# Patient Record
Sex: Male | Born: 1970 | Race: Black or African American | Hispanic: No | Marital: Single | Smoking: Current every day smoker
Health system: Southern US, Community
[De-identification: ages and names within clinical notes are randomized; demographics above are authoritative.]

## PROBLEM LIST (undated history)

## (undated) DIAGNOSIS — F419 Anxiety disorder, unspecified: Secondary | ICD-10-CM

## (undated) DIAGNOSIS — F32A Depression, unspecified: Secondary | ICD-10-CM

## (undated) DIAGNOSIS — F1020 Alcohol dependence, uncomplicated: Secondary | ICD-10-CM

## (undated) DIAGNOSIS — K602 Anal fissure, unspecified: Secondary | ICD-10-CM

## (undated) DIAGNOSIS — F329 Major depressive disorder, single episode, unspecified: Secondary | ICD-10-CM

## (undated) DIAGNOSIS — I10 Essential (primary) hypertension: Secondary | ICD-10-CM

## (undated) DIAGNOSIS — G4730 Sleep apnea, unspecified: Secondary | ICD-10-CM

## (undated) DIAGNOSIS — G43909 Migraine, unspecified, not intractable, without status migrainosus: Secondary | ICD-10-CM

## (undated) DIAGNOSIS — C801 Malignant (primary) neoplasm, unspecified: Secondary | ICD-10-CM

## (undated) HISTORY — DX: Alcohol dependence, uncomplicated: F10.20

## (undated) HISTORY — PX: HEMORROIDECTOMY: SUR656

## (undated) HISTORY — DX: Essential (primary) hypertension: I10

## (undated) HISTORY — DX: Anxiety disorder, unspecified: F41.9

## (undated) HISTORY — DX: Depression, unspecified: F32.A

## (undated) HISTORY — DX: Major depressive disorder, single episode, unspecified: F32.9

## (undated) HISTORY — DX: Anal fissure, unspecified: K60.2

---

## 2016-01-07 ENCOUNTER — Emergency Department (HOSPITAL_COMMUNITY)
Admission: EM | Admit: 2016-01-07 | Discharge: 2016-01-07 | Disposition: A | Discharge status: Home / Self Care | Payer: Managed Care, Other (non HMO) | Attending: Emergency Medicine | Admitting: Emergency Medicine

## 2016-01-07 ENCOUNTER — Encounter (HOSPITAL_COMMUNITY): Payer: Self-pay | Admitting: Family Medicine

## 2016-01-07 DIAGNOSIS — Z8679 Personal history of other diseases of the circulatory system: Secondary | ICD-10-CM | POA: Diagnosis not present

## 2016-01-07 DIAGNOSIS — M542 Cervicalgia: Secondary | ICD-10-CM | POA: Diagnosis not present

## 2016-01-07 DIAGNOSIS — M549 Dorsalgia, unspecified: Secondary | ICD-10-CM | POA: Diagnosis present

## 2016-01-07 DIAGNOSIS — M545 Low back pain: Secondary | ICD-10-CM | POA: Diagnosis not present

## 2016-01-07 DIAGNOSIS — F419 Anxiety disorder, unspecified: Secondary | ICD-10-CM | POA: Insufficient documentation

## 2016-01-07 DIAGNOSIS — F17200 Nicotine dependence, unspecified, uncomplicated: Secondary | ICD-10-CM | POA: Diagnosis not present

## 2016-01-07 DIAGNOSIS — R531 Weakness: Secondary | ICD-10-CM | POA: Diagnosis not present

## 2016-01-07 HISTORY — DX: Migraine, unspecified, not intractable, without status migrainosus: G43.909

## 2016-01-07 LAB — I-STAT TROPONIN, ED: Troponin i, poc: 0 ng/mL (ref 0.00–0.08)

## 2016-01-07 LAB — CBC
HCT: 45.9 % (ref 39.0–52.0)
HEMOGLOBIN: 15.6 g/dL (ref 13.0–17.0)
MCH: 28.7 pg (ref 26.0–34.0)
MCHC: 34 g/dL (ref 30.0–36.0)
MCV: 84.4 fL (ref 78.0–100.0)
PLATELETS: 287 10*3/uL (ref 150–400)
RBC: 5.44 MIL/uL (ref 4.22–5.81)
RDW: 13.3 % (ref 11.5–15.5)
WBC: 6.4 10*3/uL (ref 4.0–10.5)

## 2016-01-07 LAB — BASIC METABOLIC PANEL
Anion gap: 9 (ref 5–15)
BUN: 7 mg/dL (ref 6–20)
CHLORIDE: 109 mmol/L (ref 101–111)
CO2: 21 mmol/L — AB (ref 22–32)
CREATININE: 1.24 mg/dL (ref 0.61–1.24)
Calcium: 9.5 mg/dL (ref 8.9–10.3)
GFR calc non Af Amer: >60 mL/min (ref >60)
GLUCOSE: 112 mg/dL — AB (ref 65–99)
Potassium: 4.1 mmol/L (ref 3.5–5.1)
Sodium: 139 mmol/L (ref 135–145)

## 2016-01-07 MED ORDER — IBUPROFEN 200 MG PO TABS
600.0000 mg | ORAL_TABLET | Freq: Once | ORAL | Status: AC
  Administered 2016-01-07: 600 mg via ORAL
  Filled 2016-01-07: qty 1

## 2016-01-07 NOTE — ED Provider Notes (Signed)
CSN: [id number]     Arrival date & time 01/07/16  1530 History   First MD Initiated Contact with Patient 01/07/16 1832     Chief Complaint  Patient presents with  . Neck Pain  . Back Pain  . Weakness     (Consider location/radiation/quality/duration/timing/severity/associated sxs/prior Treatment) HPI Patient is an otherwise healthy 45 year old male who presents with neck and back pain, generalized malaise for 3 days. He says that he has been under tremendous amount of stress recently due to some very difficult family situations. He says that he has felt like his stress and generalized anxiety of being building up over the past few months. He says that his neck and back pain seems to be primarily worsening over the past 3 days. He says that he ate some questionable pizza that made everyone else sick the day prior to his symptoms beginning. He denies any paresthesias, lower extremity weakness, shortness of breath, fevers, chills, dysuria. He is never had similar symptoms before. He says he has not tried anything for the pain. He says that the pain is worse with neck movement and when getting up from a lying down position. Past Medical History  Diagnosis Date  . Migraine    History reviewed. No pertinent past surgical history. History reviewed. No pertinent family history. Social History  Substance Use Topics  . Smoking status: Current Every Day Smoker  . Smokeless tobacco: None  . Alcohol Use: Yes     Comment: 40 oz a day    Review of Systems  Respiratory: Negative for shortness of breath.   Cardiovascular: Negative for chest pain.  Musculoskeletal: Positive for myalgias, back pain and neck pain. Negative for joint swelling, arthralgias, gait problem and neck stiffness.  Psychiatric/Behavioral: The patient is nervous/anxious.   All other systems reviewed and are negative.     Allergies  Review of patient's allergies indicates no known allergies.  Home Medications   Prior to  Admission medications   Not on File   BP 148/111 mmHg  Pulse 72  Temp(Src) 98.1 F (36.7 C) (Oral)  Resp 18  Ht 6\' 2"  (1.88 m)  Wt 117.255 kg  BMI 33.18 kg/m2  SpO2 97% Physical Exam  Constitutional: He is oriented to person, place, and time. He appears well-developed and well-nourished.  HENT:  Head: Normocephalic and atraumatic.  Eyes: Pupils are equal, round, and reactive to light.  Neck: No JVD present.  Cardiovascular: Normal rate, regular rhythm and normal heart sounds.   Pulmonary/Chest: Effort normal and breath sounds normal. No respiratory distress.  Abdominal: Soft. Bowel sounds are normal. He exhibits no distension. There is no tenderness.  Musculoskeletal:  Paraspinous muscle tenderness along the lumbar spine, lateral to cervical spine, no bony tenderness  Neurological: He is alert and oriented to person, place, and time.  Normal bilateral patellar and ankle reflexes  Sensation normal and bilateral lower extremities, strength normal in bilateral lower extremities  Skin: Skin is warm and dry.  Nursing note and vitals reviewed.   ED Course  Procedures (including critical care time) Labs Review Labs Reviewed  BASIC METABOLIC PANEL - Abnormal; Notable for the following:    CO2 21 (*)    Glucose, Bld 112 (*)    All other components within normal limits  CBC  I-STAT TROPOININ, ED    Imaging Review No results found. I have personally reviewed and evaluated these images and lab results as part of my medical decision-making.   EKG Interpretation None  MDM   Final diagnoses:  Mechanical back pain    Patient presents with neck and back pain. He has no red flag symptoms such as urinary continence, perianal anesthesia, lower extremity weakness. He has had no trauma to his back. He has no midline tenderness. All of his back and neck pain seems to be muscular in etiology and is also not associated with any neurologic deficits. EKG obtained and shows no  abnormalities, troponin obtained through triage, though I have no concern for ACS given his generalized symptoms, it is negative nonetheless.   It seems most of his symptoms are likely related to stress, and he has no PCP, so I will set him up for a PCP appointment.  Erskine Emery, MD 01/08/16 1610  Rolland Porter, MD 01/17/16 [phone]

## 2016-01-07 NOTE — ED Notes (Signed)
Pt here for neck pain, bilateral arm heaviness and more in left arm, back pain and weakness. sts that he has been under a lot of stress in the last month. sts that he has been having a lot of migraines. sts also had some bad pizza a week ago. Denies chest pain.

## 2016-01-07 NOTE — ED Provider Notes (Signed)
Patient seen and evaluated. Discussed at length with Dr. Earlene Plateravis.  Patient reports a lot of social stressors in his life. Occasional headaches and back pain. Generalized weakness. Has normal neurological exam. No red flag neurological symptoms regarding his headache or back pain. He is ambulatory. He is not suicidal. Given referrals for outpatient counseling regarding his stress and anxiety.  Walter PorterMark Margarie Mcguirt, MD 01/07/16 [phone]

## 2016-01-07 NOTE — Discharge Instructions (Signed)

## 2016-01-23 ENCOUNTER — Ambulatory Visit (HOSPITAL_BASED_OUTPATIENT_CLINIC_OR_DEPARTMENT_OTHER)
Admission: RE | Admit: 2016-01-23 | Discharge: 2016-01-23 | Disposition: A | Discharge status: Home / Self Care | Payer: Managed Care, Other (non HMO) | Source: Ambulatory Visit | Attending: Medical | Admitting: Medical

## 2016-01-23 ENCOUNTER — Encounter: Payer: Self-pay | Admitting: Medical

## 2016-01-23 ENCOUNTER — Ambulatory Visit (INDEPENDENT_AMBULATORY_CARE_PROVIDER_SITE_OTHER): Payer: Managed Care, Other (non HMO) | Admitting: Medical

## 2016-01-23 VITALS — BP 128/84 | HR 79 | Temp 98.0°F | Ht 74.5 in | Wt 258.4 lb

## 2016-01-23 DIAGNOSIS — M545 Low back pain: Secondary | ICD-10-CM | POA: Diagnosis not present

## 2016-01-23 DIAGNOSIS — F411 Generalized anxiety disorder: Secondary | ICD-10-CM | POA: Diagnosis not present

## 2016-01-23 DIAGNOSIS — M546 Pain in thoracic spine: Secondary | ICD-10-CM

## 2016-01-23 DIAGNOSIS — M4805 Spinal stenosis, thoracolumbar region: Secondary | ICD-10-CM | POA: Diagnosis not present

## 2016-01-23 DIAGNOSIS — M2578 Osteophyte, vertebrae: Secondary | ICD-10-CM | POA: Diagnosis not present

## 2016-01-23 DIAGNOSIS — M47895 Other spondylosis, thoracolumbar region: Secondary | ICD-10-CM | POA: Insufficient documentation

## 2016-01-23 DIAGNOSIS — M2550 Pain in unspecified joint: Secondary | ICD-10-CM | POA: Diagnosis not present

## 2016-01-23 DIAGNOSIS — R739 Hyperglycemia, unspecified: Secondary | ICD-10-CM | POA: Diagnosis not present

## 2016-01-23 LAB — C-REACTIVE PROTEIN: CRP: 0.2 mg/dL — ABNORMAL LOW (ref 0.5–20.0)

## 2016-01-23 LAB — RHEUMATOID FACTOR

## 2016-01-23 LAB — HEMOGLOBIN A1C: Hgb A1c MFr Bld: 5.7 % (ref 4.6–6.5)

## 2016-01-23 LAB — SEDIMENTATION RATE: Sed Rate: 1 mm/hr (ref 0–22)

## 2016-01-23 MED ORDER — ALPRAZOLAM 0.25 MG PO TABS: 0.2500 mg | ORAL_TABLET | Freq: Two times a day (BID) | ORAL | Status: DC | PRN

## 2016-01-23 MED ORDER — SERTRALINE HCL 25 MG PO TABS: 25.0000 mg | ORAL_TABLET | Freq: Every day | ORAL | Status: DC

## 2016-01-23 NOTE — Progress Notes (Signed)
Pre visit review using our clinic review tool, if applicable. No additional management support is needed unless otherwise documented below in the visit note. 
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
## 2016-01-23 NOTE — Patient Instructions (Signed)
I do think under your extreme stressful circumstances I do think you have some anxiety and mayb depression. I think you may benefit form sertraline and xanax. Will write low dose and low number.  If you have extreme emotions or thoughts(hurting self or others then ED evaluation).  Please contact counselor number I gave you.  Will get xrays, ra panel and hb a1-c.Marland Kitchen.  Can use ibupfrofen otc for body pain.  Follow up in 7 days or as needed

## 2016-01-26 ENCOUNTER — Telehealth: Payer: Self-pay

## 2016-01-26 ENCOUNTER — Telehealth: Payer: Self-pay | Admitting: Medical

## 2016-01-26 ENCOUNTER — Encounter: Payer: Managed Care, Other (non HMO) | Admitting: Medical

## 2016-01-26 ENCOUNTER — Encounter: Payer: Self-pay | Admitting: Medical

## 2016-01-26 LAB — ANA: Anti Nuclear Antibody(ANA): NEGATIVE

## 2016-01-26 NOTE — Telephone Encounter (Signed)
Pt states that he was not feeling well and the meal that he had last night for mothers day was not agreeing with him. He also stated that the Sertraline was giving him some diarrhea and he wants to see what the provider advises on a different medication with less side effects. He states that he will discuss this with you at his CPE on 01/28/16.

## 2016-01-26 NOTE — Progress Notes (Signed)
This encounter was created in error - please disregard.

## 2016-01-26 NOTE — Telephone Encounter (Signed)
Call pt will discuss sertraline when in. Usually does not cause diarrhea. Maybe he has Gi illness from food he ate as he described? But for time being stop sertraline.

## 2016-01-26 NOTE — Telephone Encounter (Signed)
Pt reported on 01/26/16 that he had stopped taking the sertraline and he will discuss at the next visit with E. Saguier.

## 2016-01-26 NOTE — Telephone Encounter (Signed)
error:[id number] ° °

## 2016-01-27 ENCOUNTER — Telehealth: Payer: Self-pay | Admitting: Medical

## 2016-01-27 NOTE — Telephone Encounter (Signed)
Error

## 2016-01-28 ENCOUNTER — Encounter: Payer: Self-pay | Admitting: Medical

## 2016-01-28 ENCOUNTER — Ambulatory Visit (INDEPENDENT_AMBULATORY_CARE_PROVIDER_SITE_OTHER): Payer: Managed Care, Other (non HMO) | Admitting: Medical

## 2016-01-28 VITALS — BP 126/86 | HR 87 | Temp 98.0°F | Ht 74.5 in | Wt 257.0 lb

## 2016-01-28 DIAGNOSIS — G43809 Other migraine, not intractable, without status migrainosus: Secondary | ICD-10-CM

## 2016-01-28 DIAGNOSIS — Z0000 Encounter for general adult medical examination without abnormal findings: Secondary | ICD-10-CM | POA: Diagnosis not present

## 2016-01-28 DIAGNOSIS — Z0001 Encounter for general adult medical examination with abnormal findings: Secondary | ICD-10-CM

## 2016-01-28 DIAGNOSIS — Z23 Encounter for immunization: Secondary | ICD-10-CM | POA: Diagnosis not present

## 2016-01-28 DIAGNOSIS — R7989 Other specified abnormal findings of blood chemistry: Secondary | ICD-10-CM

## 2016-01-28 DIAGNOSIS — Z113 Encounter for screening for infections with a predominantly sexual mode of transmission: Secondary | ICD-10-CM

## 2016-01-28 LAB — LIPID PANEL
CHOL/HDL RATIO: 8
CHOLESTEROL: 209 mg/dL — AB (ref 0–200)
HDL: 26.7 mg/dL — ABNORMAL LOW (ref >39.00)
NonHDL: 181.89
TRIGLYCERIDES: 326 mg/dL — AB (ref 0.0–149.0)
VLDL: 65.2 mg/dL — AB (ref 0.0–40.0)

## 2016-01-28 LAB — COMPREHENSIVE METABOLIC PANEL
ALT: 69 U/L — ABNORMAL HIGH (ref 0–53)
AST: 42 U/L — AB (ref 0–37)
Albumin: 4.4 g/dL (ref 3.5–5.2)
Alkaline Phosphatase: 98 U/L (ref 39–117)
BUN: 14 mg/dL (ref 6–23)
CALCIUM: 9.2 mg/dL (ref 8.4–10.5)
CHLORIDE: 108 meq/L (ref 96–112)
CO2: 22 meq/L (ref 19–32)
CREATININE: 1.28 mg/dL (ref 0.40–1.50)
GFR: 78.69 mL/min (ref >60.00)
Glucose, Bld: 109 mg/dL — ABNORMAL HIGH (ref 70–99)
Potassium: 3.8 mEq/L (ref 3.5–5.1)
Sodium: 138 mEq/L (ref 135–145)
Total Bilirubin: 0.5 mg/dL (ref 0.2–1.2)
Total Protein: 7.5 g/dL (ref 6.0–8.3)

## 2016-01-28 LAB — CBC WITH DIFFERENTIAL/PLATELET
BASOS ABS: 0 10*3/uL (ref 0.0–0.1)
Basophils Relative: 0.6 % (ref 0.0–3.0)
EOS ABS: 0.3 10*3/uL (ref 0.0–0.7)
Eosinophils Relative: 4.9 % (ref 0.0–5.0)
HEMATOCRIT: 45.9 % (ref 39.0–52.0)
Hemoglobin: 15.6 g/dL (ref 13.0–17.0)
LYMPHS ABS: 3 10*3/uL (ref 0.7–4.0)
Lymphocytes Relative: 45.5 % (ref 12.0–46.0)
MCHC: 33.9 g/dL (ref 30.0–36.0)
MCV: 85.2 fl (ref 78.0–100.0)
MONOS PCT: 8.1 % (ref 3.0–12.0)
Monocytes Absolute: 0.5 10*3/uL (ref 0.1–1.0)
NEUTROS PCT: 40.9 % — AB (ref 43.0–77.0)
Neutro Abs: 2.7 10*3/uL (ref 1.4–7.7)
PLATELETS: 312 10*3/uL (ref 150.0–400.0)
RBC: 5.38 Mil/uL (ref 4.22–5.81)
RDW: 13.5 % (ref 11.5–15.5)
WBC: 6.5 10*3/uL (ref 4.0–10.5)

## 2016-01-28 LAB — POC URINALSYSI DIPSTICK (AUTOMATED)
Bilirubin, UA: NEGATIVE
Blood, UA: NEGATIVE
GLUCOSE UA: NEGATIVE
KETONES UA: NEGATIVE
Leukocytes, UA: NEGATIVE
Nitrite, UA: NEGATIVE
Protein, UA: NEGATIVE
SPEC GRAV UA: 1.02
Urobilinogen, UA: 1
pH, UA: 6

## 2016-01-28 LAB — LDL CHOLESTEROL, DIRECT: Direct LDL: 114 mg/dL

## 2016-01-28 LAB — TSH: TSH: 2.45 u[IU]/mL (ref 0.35–4.50)

## 2016-01-28 MED ORDER — ALPRAZOLAM 0.25 MG PO TABS: 0.2500 mg | ORAL_TABLET | Freq: Two times a day (BID) | ORAL | Status: DC | PRN

## 2016-01-28 MED ORDER — KETOROLAC TROMETHAMINE 60 MG/2ML IM SOLN
60.0000 mg | Freq: Once | INTRAMUSCULAR | Status: AC
Start: 2016-01-28 — End: 2016-01-28
  Administered 2016-01-28: 60 mg via INTRAMUSCULAR

## 2016-01-28 NOTE — Addendum Note (Signed)
Addended by: Neldon LabellaMABE, HOLDEN S on: 01/28/2016 04:51 PM   Modules accepted: Orders

## 2016-01-28 NOTE — Assessment & Plan Note (Signed)
Cbc,cmp, tsh, lipid panel, hiv screen and ua. Will update tdap today.

## 2016-01-28 NOTE — Progress Notes (Signed)
Pre visit review using our clinic review tool, if applicable. No additional management support is needed unless otherwise documented below in the visit note. 

## 2016-01-28 NOTE — Addendum Note (Signed)
Addended by: Neldon LabellaMABE, HOLDEN S on: 01/28/2016 11:16 AM   Modules accepted: Orders
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
## 2016-01-28 NOTE — Addendum Note (Signed)
Addended by: Neldon LabellaMABE, Fielding Mault S on: 01/28/2016 11:52 AM   Modules accepted: Orders

## 2016-01-28 NOTE — Patient Instructions (Addendum)
Wellness examination Cbc,cmp, tsh, lipid panel, hiv screen and ua. Will update tdap today.    For your anxiety, stress and mood will rx xanax. Will hold off on sertraline type med until you see psychiatrist next week.  I am going to write a note today explaining that time off from work is reasonable. And that psychiatrist will be filling official forms. By chance if they won't fill out forms let me know.  Will follow labs and let you know results when they are back.  For tension ha today we gave toradol 60 mg im today. If worse ha with other signs or symptoms as discussed.  Preventive Care for Adults, Male A healthy lifestyle and preventive care can promote health and wellness. Preventive health guidelines for men include the following key practices:  A routine yearly physical is a good way to check with your health care provider about your health and preventative screening. It is a chance to share any concerns and updates on your health and to receive a thorough exam.  Visit your dentist for a routine exam and preventative care every 6 months. Brush your teeth twice a day and floss once a day. Good oral hygiene prevents tooth decay and gum disease.  The frequency of eye exams is based on your age, health, family medical history, use of contact lenses, and other factors. Follow your health care provider's recommendations for frequency of eye exams.  Eat a healthy diet. Foods such as vegetables, fruits, whole grains, low-fat dairy products, and lean protein foods contain the nutrients you need without too many calories. Decrease your intake of foods high in solid fats, added sugars, and salt. Eat the right amount of calories for you.Get information about a proper diet from your health care provider, if necessary.  Regular physical exercise is one of the most important things you can do for your health. Most adults should get at least 150 minutes of moderate-intensity exercise (any activity  that increases your heart rate and causes you to sweat) each week. In addition, most adults need muscle-strengthening exercises on 2 or more days a week.  Maintain a healthy weight. The body mass index (BMI) is a screening tool to identify possible weight problems. It provides an estimate of body fat based on height and weight. Your health care provider can find your BMI and can help you achieve or maintain a healthy weight.For adults 20 years and older:  A BMI below 18.5 is considered underweight.  A BMI of 18.5 to 24.9 is normal.  A BMI of 25 to 29.9 is considered overweight.  A BMI of 30 and above is considered obese.  Maintain normal blood lipids and cholesterol levels by exercising and minimizing your intake of saturated fat. Eat a balanced diet with plenty of fruit and vegetables. Blood tests for lipids and cholesterol should begin at age 52 and be repeated every 5 years. If your lipid or cholesterol levels are high, you are over 50, or you are at high risk for heart disease, you may need your cholesterol levels checked more frequently.Ongoing high lipid and cholesterol levels should be treated with medicines if diet and exercise are not working.  If you smoke, find out from your health care provider how to quit. If you do not use tobacco, do not start.  Lung cancer screening is recommended for adults aged 39-80 years who are at high risk for developing lung cancer because of a history of smoking. A yearly low-dose CT scan of  the lungs is recommended for people who have at least a 30-pack-year history of smoking and are a current smoker or have quit within the past 15 years. A pack year of smoking is smoking an average of 1 pack of cigarettes a day for 1 year (for example: 1 pack a day for 30 years or 2 packs a day for 15 years). Yearly screening should continue until the smoker has stopped smoking for at least 15 years. Yearly screening should be stopped for people who develop a health  problem that would prevent them from having lung cancer treatment.  If you choose to drink alcohol, do not have more than 2 drinks per day. One drink is considered to be 12 ounces (355 mL) of beer, 5 ounces (148 mL) of wine, or 1.5 ounces (44 mL) of liquor.  Avoid use of street drugs. Do not share needles with anyone. Ask for help if you need support or instructions about stopping the use of drugs.  High blood pressure causes heart disease and increases the risk of stroke. Your blood pressure should be checked at least every 1-2 years. Ongoing high blood pressure should be treated with medicines, if weight loss and exercise are not effective.  If you are 4-41 years old, ask your health care provider if you should take aspirin to prevent heart disease.  Diabetes screening is done by taking a blood sample to check your blood glucose level after you have not eaten for a certain period of time (fasting). If you are not overweight and you do not have risk factors for diabetes, you should be screened once every 3 years starting at age 63. If you are overweight or obese and you are 19-74 years of age, you should be screened for diabetes every year as part of your cardiovascular risk assessment.  Colorectal cancer can be detected and often prevented. Most routine colorectal cancer screening begins at the age of 71 and continues through age 30. However, your health care provider may recommend screening at an earlier age if you have risk factors for colon cancer. On a yearly basis, your health care provider may provide home test kits to check for hidden blood in the stool. Use of a small camera at the end of a tube to directly examine the colon (sigmoidoscopy or colonoscopy) can detect the earliest forms of colorectal cancer. Talk to your health care provider about this at age 29, when routine screening begins. Direct exam of the colon should be repeated every 5-10 years through age 67, unless early forms of  precancerous polyps or small growths are found.  People who are at an increased risk for hepatitis B should be screened for this virus. You are considered at high risk for hepatitis B if:  You were born in a country where hepatitis B occurs often. Talk with your health care provider about which countries are considered high risk.  Your parents were born in a high-risk country and you have not received a shot to protect against hepatitis B (hepatitis B vaccine).  You have HIV or AIDS.  You use needles to inject street drugs.  You live with, or have sex with, someone who has hepatitis B.  You are a man who has sex with other men (MSM).  You get hemodialysis treatment.  You take certain medicines for conditions such as cancer, organ transplantation, and autoimmune conditions.  Hepatitis C blood testing is recommended for all people born from [birth year] through 1965 and any individual  with known risks for hepatitis C.  Practice safe sex. Use condoms and avoid high-risk sexual practices to reduce the spread of sexually transmitted infections (STIs). STIs include gonorrhea, chlamydia, syphilis, trichomonas, herpes, HPV, and human immunodeficiency virus (HIV). Herpes, HIV, and HPV are viral illnesses that have no cure. They can result in disability, cancer, and death.  If you are a man who has sex with other men, you should be screened at least once per year for:  HIV.  Urethral, rectal, and pharyngeal infection of gonorrhea, chlamydia, or both.  If you are at risk of being infected with HIV, it is recommended that you take a prescription medicine daily to prevent HIV infection. This is called preexposure prophylaxis (PrEP). You are considered at risk if:  You are a man who has sex with other men (MSM) and have other risk factors.  You are a heterosexual man, are sexually active, and are at increased risk for HIV infection.  You take drugs by injection.  You are sexually active with a  partner who has HIV.  Talk with your health care provider about whether you are at high risk of being infected with HIV. If you choose to begin PrEP, you should first be tested for HIV. You should then be tested every 3 months for as long as you are taking PrEP.  A one-time screening for abdominal aortic aneurysm (AAA) and surgical repair of large AAAs by ultrasound are recommended for men ages 86 to 55 years who are current or former smokers.  Healthy men should no longer receive prostate-specific antigen (PSA) blood tests as part of routine cancer screening. Talk with your health care provider about prostate cancer screening.  Testicular cancer screening is not recommended for adult males who have no symptoms. Screening includes self-exam, a health care provider exam, and other screening tests. Consult with your health care provider about any symptoms you have or any concerns you have about testicular cancer.  Use sunscreen. Apply sunscreen liberally and repeatedly throughout the day. You should seek shade when your shadow is shorter than you. Protect yourself by wearing long sleeves, pants, a wide-brimmed hat, and sunglasses year round, whenever you are outdoors.  Once a month, do a whole-body skin exam, using a mirror to look at the skin on your back. Tell your health care provider about new moles, moles that have irregular borders, moles that are larger than a pencil eraser, or moles that have changed in shape or color.  Stay current with required vaccines (immunizations).  Influenza vaccine. All adults should be immunized every year.  Tetanus, diphtheria, and acellular pertussis (Td, Tdap) vaccine. An adult who has not previously received Tdap or who does not know his vaccine status should receive 1 dose of Tdap. This initial dose should be followed by tetanus and diphtheria toxoids (Td) booster doses every 10 years. Adults with an unknown or incomplete history of completing a 3-dose  immunization series with Td-containing vaccines should begin or complete a primary immunization series including a Tdap dose. Adults should receive a Td booster every 10 years.  Varicella vaccine. An adult without evidence of immunity to varicella should receive 2 doses or a second dose if he has previously received 1 dose.  Human papillomavirus (HPV) vaccine. Males aged 11-21 years who have not received the vaccine previously should receive the 3-dose series. Males aged 22-26 years may be immunized. Immunization is recommended through the age of 53 years for any male who has sex with males and  did not get any or all doses earlier. Immunization is recommended for any person with an immunocompromised condition through the age of 47 years if he did not get any or all doses earlier. During the 3-dose series, the second dose should be obtained 4-8 weeks after the first dose. The third dose should be obtained 24 weeks after the first dose and 16 weeks after the second dose.  Zoster vaccine. One dose is recommended for adults aged 43 years or older unless certain conditions are present.  Measles, mumps, and rubella (MMR) vaccine. Adults born before [birth year] generally are considered immune to measles and mumps. Adults born in [birth year] or later should have 1 or more doses of MMR vaccine unless there is a contraindication to the vaccine or there is laboratory evidence of immunity to each of the three diseases. A routine second dose of MMR vaccine should be obtained at least 28 days after the first dose for students attending postsecondary schools, health care workers, or international travelers. People who received inactivated measles vaccine or an unknown type of measles vaccine during 1963-1967 should receive 2 doses of MMR vaccine. People who received inactivated mumps vaccine or an unknown type of mumps vaccine before 1979 and are at high risk for mumps infection should consider immunization with 2 doses of MMR vaccine.  Unvaccinated health care workers born before [birth year] who lack laboratory evidence of measles, mumps, or rubella immunity or laboratory confirmation of disease should consider measles and mumps immunization with 2 doses of MMR vaccine or rubella immunization with 1 dose of MMR vaccine.  Pneumococcal 13-valent conjugate (PCV13) vaccine. When indicated, a person who is uncertain of his immunization history and has no record of immunization should receive the PCV13 vaccine. All adults 81 years of age and older should receive this vaccine. An adult aged 50 years or older who has certain medical conditions and has not been previously immunized should receive 1 dose of PCV13 vaccine. This PCV13 should be followed with a dose of pneumococcal polysaccharide (PPSV23) vaccine. Adults who are at high risk for pneumococcal disease should obtain the PPSV23 vaccine at least 8 weeks after the dose of PCV13 vaccine. Adults older than 44 years of age who have normal immune system function should obtain the PPSV23 vaccine dose at least 1 year after the dose of PCV13 vaccine.  Pneumococcal polysaccharide (PPSV23) vaccine. When PCV13 is also indicated, PCV13 should be obtained first. All adults aged 56 years and older should be immunized. An adult younger than age 75 years who has certain medical conditions should be immunized. Any person who resides in a nursing home or long-term care facility should be immunized. An adult smoker should be immunized. People with an immunocompromised condition and certain other conditions should receive both PCV13 and PPSV23 vaccines. People with human immunodeficiency virus (HIV) infection should be immunized as soon as possible after diagnosis. Immunization during chemotherapy or radiation therapy should be avoided. Routine use of PPSV23 vaccine is not recommended for American Indians, [place] Natives, or people younger than 65 years unless there are medical conditions that require PPSV23 vaccine.  When indicated, people who have unknown immunization and have no record of immunization should receive PPSV23 vaccine. One-time revaccination 5 years after the first dose of PPSV23 is recommended for people aged 19-64 years who have chronic kidney failure, nephrotic syndrome, asplenia, or immunocompromised conditions. People who received 1-2 doses of PPSV23 before age 13 years should receive another dose of PPSV23 vaccine at age 87 years or  later if at least 5 years have passed since the previous dose. Doses of PPSV23 are not needed for people immunized with PPSV23 at or after age 57 years.  Meningococcal vaccine. Adults with asplenia or persistent complement component deficiencies should receive 2 doses of quadrivalent meningococcal conjugate (MenACWY-D) vaccine. The doses should be obtained at least 2 months apart. Microbiologists working with certain meningococcal bacteria, [place] recruits, people at risk during an outbreak, and people who travel to or live in countries with a high rate of meningitis should be immunized. A first-year college student up through age 25 years who is living in a residence hall should receive a dose if he did not receive a dose on or after his 16th birthday. Adults who have certain high-risk conditions should receive one or more doses of vaccine.  Hepatitis A vaccine. Adults who wish to be protected from this disease, have chronic liver disease, work with hepatitis A-infected animals, work in hepatitis A research labs, or travel to or work in countries with a high rate of hepatitis A should be immunized. Adults who were previously unvaccinated and who anticipate close contact with an international adoptee during the first 60 days after arrival in the [place] from a country with a high rate of hepatitis A should be immunized.  Hepatitis B vaccine. Adults should be immunized if they wish to be protected from this disease, are under age 41 years and have diabetes, have  chronic liver disease, have had more than one sex partner in the past 6 months, may be exposed to blood or other infectious body fluids, are household contacts or sex partners of hepatitis B positive people, are clients or workers in certain care facilities, or travel to or work in countries with a high rate of hepatitis B.  Haemophilus influenzae type b (Hib) vaccine. A previously unvaccinated person with asplenia or sickle cell disease or having a scheduled splenectomy should receive 1 dose of Hib vaccine. Regardless of previous immunization, a recipient of a hematopoietic stem cell transplant should receive a 3-dose series 6-12 months after his successful transplant. Hib vaccine is not recommended for adults with HIV infection. Preventive Service / Frequency Ages 55 to 56  Blood pressure check.** / Every 3-5 years.  Lipid and cholesterol check.** / Every 5 years beginning at age 31.  Hepatitis C blood test.** / For any individual with known risks for hepatitis C.  Skin self-exam. / Monthly.  Influenza vaccine. / Every year.  Tetanus, diphtheria, and acellular pertussis (Tdap, Td) vaccine.** / Consult your health care provider. 1 dose of Td every 10 years.  Varicella vaccine.** / Consult your health care provider.  HPV vaccine. / 3 doses over 6 months, if 35 or younger.  Measles, mumps, rubella (MMR) vaccine.** / You need at least 1 dose of MMR if you were born in 1957 or later. You may also need a second dose.  Pneumococcal 13-valent conjugate (PCV13) vaccine.** / Consult your health care provider.  Pneumococcal polysaccharide (PPSV23) vaccine.** / 1 to 2 doses if you smoke cigarettes or if you have certain conditions.  Meningococcal vaccine.** / 1 dose if you are age 12 to 61 years and a [employer] researcher living in a residence hall, or have one of several medical conditions. You may also need additional booster doses.  Hepatitis A vaccine.** / Consult your health care  provider.  Hepatitis B vaccine.** / Consult your health care provider.  Haemophilus influenzae type b (Hib) vaccine.** / Consult your health  care provider. Ages 31 to 15  Blood pressure check.** / Every year.  Lipid and cholesterol check.** / Every 5 years beginning at age 46.  Lung cancer screening. / Every year if you are aged 14-80 years and have a 30-pack-year history of smoking and currently smoke or have quit within the past 15 years. Yearly screening is stopped once you have quit smoking for at least 15 years or develop a health problem that would prevent you from having lung cancer treatment.  Fecal occult blood test (FOBT) of stool. / Every year beginning at age 32 and continuing until age 44. You may not have to do this test if you get a colonoscopy every 10 years.  Flexible sigmoidoscopy** or colonoscopy.** / Every 5 years for a flexible sigmoidoscopy or every 10 years for a colonoscopy beginning at age 40 and continuing until age 44.  Hepatitis C blood test.** / For all people born from [birth year] through 1965 and any individual with known risks for hepatitis C.  Skin self-exam. / Monthly.  Influenza vaccine. / Every year.  Tetanus, diphtheria, and acellular pertussis (Tdap/Td) vaccine.** / Consult your health care provider. 1 dose of Td every 10 years.  Varicella vaccine.** / Consult your health care provider.  Zoster vaccine.** / 1 dose for adults aged 42 years or older.  Measles, mumps, rubella (MMR) vaccine.** / You need at least 1 dose of MMR if you were born in 1957 or later. You may also need a second dose.  Pneumococcal 13-valent conjugate (PCV13) vaccine.** / Consult your health care provider.  Pneumococcal polysaccharide (PPSV23) vaccine.** / 1 to 2 doses if you smoke cigarettes or if you have certain conditions.  Meningococcal vaccine.** / Consult your health care provider.  Hepatitis A vaccine.** / Consult your health care provider.  Hepatitis B vaccine.** /  Consult your health care provider.  Haemophilus influenzae type b (Hib) vaccine.** / Consult your health care provider. Ages 41 and over  Blood pressure check.** / Every year.  Lipid and cholesterol check.**/ Every 5 years beginning at age 40.  Lung cancer screening. / Every year if you are aged 26-80 years and have a 30-pack-year history of smoking and currently smoke or have quit within the past 15 years. Yearly screening is stopped once you have quit smoking for at least 15 years or develop a health problem that would prevent you from having lung cancer treatment.  Fecal occult blood test (FOBT) of stool. / Every year beginning at age 21 and continuing until age 74. You may not have to do this test if you get a colonoscopy every 10 years.  Flexible sigmoidoscopy** or colonoscopy.** / Every 5 years for a flexible sigmoidoscopy or every 10 years for a colonoscopy beginning at age 45 and continuing until age 61.  Hepatitis C blood test.** / For all people born from [birth year] through 1965 and any individual with known risks for hepatitis C.  Abdominal aortic aneurysm (AAA) screening.** / A one-time screening for ages 62 to 60 years who are current or former smokers.  Skin self-exam. / Monthly.  Influenza vaccine. / Every year.  Tetanus, diphtheria, and acellular pertussis (Tdap/Td) vaccine.** / 1 dose of Td every 10 years.  Varicella vaccine.** / Consult your health care provider.  Zoster vaccine.** / 1 dose for adults aged 37 years or older.  Pneumococcal 13-valent conjugate (PCV13) vaccine.** / 1 dose for all adults aged 38 years and older.  Pneumococcal polysaccharide (PPSV23) vaccine.** / 1 dose for all  adults aged 70 years and older.  Meningococcal vaccine.** / Consult your health care provider.  Hepatitis A vaccine.** / Consult your health care provider.  Hepatitis B vaccine.** / Consult your health care provider.  Haemophilus influenzae type b (Hib) vaccine.** / Consult your  health care provider. **Family history and personal history of risk and conditions may change your health care provider's recommendations.   This information is not intended to replace advice given to you by your health care provider. Make sure you discuss any questions you have with your health care provider.   Document Released: 10/26/2001 Document Revised: 09/20/2014 Document Reviewed: 01/25/2011 Elsevier Interactive Patient Education [employer]. .pro

## 2016-01-29 LAB — HIV ANTIBODY (ROUTINE TESTING W REFLEX): HIV: NONREACTIVE

## 2016-02-03 ENCOUNTER — Telehealth: Payer: Self-pay | Admitting: Medical

## 2016-02-03 NOTE — Telephone Encounter (Signed)
Edward please advise on the note below.

## 2016-02-03 NOTE — Telephone Encounter (Signed)
Can be reached: [phone]  Reason for call: Pt called stating that his job is in jeopardy. He has appt Thurs 2:00 with psychiatrist for himself. His daughter has appt on Wednesday as she is out of hospital/suicide watch. He stated that Ramon Dredgedward was going to hold the paperwork and may defer to psychiatrist. He is asking if [employer]on Dredgedward can complete the paperwork so he does not lose his job. Please f/u with pt.

## 2016-02-03 NOTE — Telephone Encounter (Signed)
I had put in referral to psychiatry. Does he have appointment with counselor and psychiatry or just counselor. He needs to see both. Who is he seeing this Thursday.
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
## 2016-02-04 ENCOUNTER — Telehealth: Payer: Self-pay | Admitting: Medical

## 2016-02-04 DIAGNOSIS — F4323 Adjustment disorder with mixed anxiety and depressed mood: Secondary | ICD-10-CM

## 2016-02-04 DIAGNOSIS — F329 Major depressive disorder, single episode, unspecified: Secondary | ICD-10-CM

## 2016-02-04 DIAGNOSIS — F32A Depression, unspecified: Secondary | ICD-10-CM

## 2016-02-04 NOTE — Telephone Encounter (Signed)
Referral to psychiatrist placed. 

## 2016-02-04 NOTE — Telephone Encounter (Signed)
[facility] patient [facility] does not have a psychiatrist appt.

## 2016-02-04 NOTE — Telephone Encounter (Signed)
It states below that he has a psychiatrist today @ 2

## 2016-02-04 NOTE — Telephone Encounter (Signed)
Do you know that is with and where. Pt stated he was coming here for appointment. So that made me think he was seeing counselor and not psychiatrist. I want him to see both but he has forms that I think psychiatrist needs to fill out.

## 2016-02-04 NOTE — Telephone Encounter (Signed)
Will you put in psychiatry appointment. I wrote the referral. Do they have and it is pending. Pt has to call psychiatrist?

## 2016-02-04 NOTE — Telephone Encounter (Signed)
Once referral is placed and sent to Behavioral health, I can no longer see it. Can you place referral for a psychiatrist?

## 2016-02-05 ENCOUNTER — Ambulatory Visit (INDEPENDENT_AMBULATORY_CARE_PROVIDER_SITE_OTHER): Payer: Managed Care, Other (non HMO) | Admitting: Licensed Clinical Social Worker

## 2016-02-05 DIAGNOSIS — F321 Major depressive disorder, single episode, moderate: Secondary | ICD-10-CM | POA: Diagnosis not present

## 2016-02-06 ENCOUNTER — Ambulatory Visit (HOSPITAL_BASED_OUTPATIENT_CLINIC_OR_DEPARTMENT_OTHER)
Admission: RE | Admit: 2016-02-06 | Discharge: 2016-02-06 | Disposition: A | Discharge status: Home / Self Care | Payer: Managed Care, Other (non HMO) | Source: Ambulatory Visit | Attending: Medical | Admitting: Medical

## 2016-02-06 ENCOUNTER — Encounter: Payer: Self-pay | Admitting: Medical

## 2016-02-06 ENCOUNTER — Ambulatory Visit (INDEPENDENT_AMBULATORY_CARE_PROVIDER_SITE_OTHER): Payer: Managed Care, Other (non HMO) | Admitting: Medical

## 2016-02-06 VITALS — BP 128/86 | HR 78 | Temp 98.1°F | Ht 74.5 in | Wt 265.2 lb

## 2016-02-06 DIAGNOSIS — R0781 Pleurodynia: Secondary | ICD-10-CM | POA: Diagnosis present

## 2016-02-06 DIAGNOSIS — F411 Generalized anxiety disorder: Secondary | ICD-10-CM

## 2016-02-06 DIAGNOSIS — M546 Pain in thoracic spine: Secondary | ICD-10-CM

## 2016-02-06 DIAGNOSIS — Z720 Tobacco use: Secondary | ICD-10-CM | POA: Insufficient documentation

## 2016-02-06 DIAGNOSIS — F17200 Nicotine dependence, unspecified, uncomplicated: Secondary | ICD-10-CM

## 2016-02-06 DIAGNOSIS — M545 Low back pain: Secondary | ICD-10-CM

## 2016-02-06 LAB — D-DIMER, QUANTITATIVE (NOT AT ARMC): D DIMER QUANT: 0.41 ug{FEU}/mL (ref 0.00–0.48)

## 2016-02-06 MED ORDER — ALPRAZOLAM 0.5 MG PO TABS: 0.5000 mg | ORAL_TABLET | Freq: Two times a day (BID) | ORAL | Status: DC | PRN

## 2016-02-06 MED ORDER — DICLOFENAC SODIUM 75 MG PO TBEC: 75.0000 mg | DELAYED_RELEASE_TABLET | Freq: Two times a day (BID) | ORAL | Status: DC
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
## 2016-02-06 NOTE — Patient Instructions (Addendum)
For your pleuritic pain will get cxr. Also will get d-dimer. If test is + then will need to get d-dimer.  For smoking will get cxr today.   For pleuritic chest wall pain will rx diclofenac.   For anxiety can continue xanax. Will refill med today(cautioned on not to use with alcohol)  Try to stop smoking. May rx med in future to help.  I filled out FMLA paper work. The other paperwork appears to be needed to be filled by Board Certified specialist. So psychiatrist will need to fill.  Follow up in 10-14 days or as needed

## 2016-02-06 NOTE — Addendum Note (Signed)
Addended by: Neldon LabellaMABE, Jahmere Bramel S on: 02/06/2016 11:45 AM   Modules accepted: Orders, Medications

## 2016-02-06 NOTE — Progress Notes (Signed)
Pre visit review using our clinic review tool, if applicable. No additional management support is needed unless otherwise documented below in the visit note. 

## 2016-02-12 ENCOUNTER — Ambulatory Visit: Payer: Managed Care, Other (non HMO) | Admitting: Licensed Clinical Social Worker

## 2016-02-23 ENCOUNTER — Ambulatory Visit: Payer: Managed Care, Other (non HMO) | Admitting: Licensed Clinical Social Worker

## 2016-02-24 ENCOUNTER — Telehealth: Payer: Self-pay | Admitting: Medical

## 2016-02-24 NOTE — Telephone Encounter (Signed)
I called pt and asked him to get specialist to fill out his [employer] form. I had referred to specialist and asked him to call and get established. The form state person Board certified to fill out. He will get the form on Friday and give to behavioral health. Will place up front with receptionist in alphabetical file

## 2016-04-15 ENCOUNTER — Ambulatory Visit (HOSPITAL_COMMUNITY): Payer: Self-pay | Admitting: Psychiatry

## 2016-06-07 ENCOUNTER — Ambulatory Visit (HOSPITAL_COMMUNITY): Payer: Self-pay | Admitting: Psychiatry

## 2016-06-29 NOTE — Progress Notes (Deleted)
Psychiatric Initial Adult Assessment   Patient Identification: Rylynn Schoneman MRN:  [id number] Date of Evaluation:  06/29/2016 Referral Source: *** Chief Complaint:   Visit Diagnosis: No diagnosis found.  History of Present Illness:   Arjen Deringer is a 45 year old male with depression, migraine,   Associated Signs/Symptoms: Depression Symptoms:  {DEPRESSION SYMPTOMS:20000} (Hypo) Manic Symptoms:  {BHH MANIC SYMPTOMS:22872} Anxiety Symptoms:  {BHH ANXIETY SYMPTOMS:22873} Psychotic Symptoms:  {BHH PSYCHOTIC SYMPTOMS:22874} PTSD Symptoms: {BHH PTSD SYMPTOMS:22875}  Past Psychiatric History: ***  Previous Psychotropic Medications: {YES/NO:21197}  Substance Abuse History in the last 12 months:  {yes no:314532}  Consequences of Substance Abuse: {BHH CONSEQUENCES OF SUBSTANCE ABUSE:22880}  Past Medical History:  Past Medical History:  Diagnosis Date  . Anxiety   . Depression   . Migraine     Past Surgical History:  Procedure Laterality Date  . HEMORROIDECTOMY      Family Psychiatric History: ***  Family History: No family history on file.  Social History:   Social History   Social History  . Marital status: Single    Spouse name: N/A  . Number of children: N/A  . Years of education: N/A   Social History Main Topics  . Smoking status: Current Every Day Smoker  . Smokeless tobacco: Not on file  . Alcohol use Yes     Comment: 40 oz a day  . Drug use: No     Comment: in the past used coccaine. 3 yrs ago.  Marland Kitchen Sexual activity: No   Other Topics Concern  . Not on file   Social History Narrative  . No narrative on file    Additional Social History: ***  Allergies:  No Known Allergies  Metabolic Disorder Labs: Lab Results  Component Value Date   HGBA1C 5.7 01/23/2016   No results found for: PROLACTIN Lab Results  Component Value Date   CHOL 209 (H) 01/28/2016   TRIG 326.0 (H) 01/28/2016   HDL 26.70 (L) 01/28/2016   CHOLHDL 8 01/28/2016   VLDL 65.2 (H)  01/28/2016     Current Medications: Current Outpatient Prescriptions  Medication Sig Dispense Refill  . ALPRAZolam (XANAX) 0.5 MG tablet Take 1 tablet (0.5 mg total) by mouth 2 (two) times daily as needed for anxiety. 30 tablet 1  . diclofenac (VOLTAREN) 75 MG EC tablet Take 1 tablet (75 mg total) by mouth 2 (two) times daily. 60 tablet 0  . docusate sodium (COLACE) 100 MG capsule Take 100 mg by mouth 2 (two) times daily.    . Multiple Vitamins-Minerals (MULTIVITAMIN WITH MINERALS) tablet Take 1 tablet by mouth daily.     No current facility-administered medications for this visit.     Neurologic: Headache: {BHH YES OR NO:22294} Seizure: {BHH YES OR NO:22294} Paresthesias:{BHH YES OR WU:98119}  Musculoskeletal: Strength & Muscle Tone: {desc; muscle tone:32375} Gait & Station: {PE GAIT ED JYNW:29562} Patient leans: {Patient Leans:21022755}  Psychiatric Specialty Exam: ROS  There were no vitals taken for this visit.There is no height or weight on file to calculate BMI.  General Appearance: {Appearance:22683}  Eye Contact:  {BHH EYE CONTACT:22684}  Speech:  {Speech:22685}  Volume:  {Volume (PAA):22686}  Mood:  {BHH MOOD:22306}  Affect:  {Affect (PAA):22687}  Thought Process:  {Thought Process (PAA):22688}  Orientation:  {BHH ORIENTATION (PAA):22689}  Thought Content:  {Thought Content:22690}  Suicidal Thoughts:  {ST/HT (PAA):22692}  Homicidal Thoughts:  {ST/HT (PAA):22692}  Memory:  {BHH ZHYQMV:78469}  Judgement:  {Judgement (PAA):22694}  Insight:  {Insight (PAA):22695}  Psychomotor Activity:  {  Psychomotor (PAA):22696}  Concentration:  {Concentration:21399}  Recall:  {BHH GOOD/FAIR/POOR:22877}  Fund of Knowledge:{BHH GOOD/FAIR/POOR:22877}  Language: {BHH GOOD/FAIR/POOR:22877}  Akathisia:  {BHH YES OR NO:22294}  Handed:  {Handed:22697}  AIMS (if indicated):  ***  Assets:  {Assets (PAA):22698}  ADL's:  {BHH ZOX'W:96045}ADL'S:22290}  Cognition: {chl bhh cognition:304700322}  Sleep:   ***    Treatment Plan Summary: {CHL AMB BH MD TX WUJW:1191478295}[phone]}   Neysa Hottereina Byanka Landrus, MD 10/17/20174:46 PM

## 2016-06-30 ENCOUNTER — Ambulatory Visit (HOSPITAL_COMMUNITY): Payer: Self-pay | Admitting: Psychiatry

## 2016-07-12 ENCOUNTER — Telehealth: Payer: Self-pay | Admitting: Medical

## 2016-07-12 ENCOUNTER — Encounter: Payer: Self-pay | Admitting: Medical

## 2016-07-12 ENCOUNTER — Ambulatory Visit (INDEPENDENT_AMBULATORY_CARE_PROVIDER_SITE_OTHER): Payer: Managed Care, Other (non HMO) | Admitting: Medical

## 2016-07-12 VITALS — BP 143/93 | HR 76 | Temp 98.1°F | Ht 74.5 in | Wt 270.6 lb

## 2016-07-12 DIAGNOSIS — F1911 Other psychoactive substance abuse, in remission: Secondary | ICD-10-CM

## 2016-07-12 DIAGNOSIS — J029 Acute pharyngitis, unspecified: Secondary | ICD-10-CM

## 2016-07-12 DIAGNOSIS — I10 Essential (primary) hypertension: Secondary | ICD-10-CM

## 2016-07-12 DIAGNOSIS — Z87898 Personal history of other specified conditions: Secondary | ICD-10-CM

## 2016-07-12 DIAGNOSIS — Z23 Encounter for immunization: Secondary | ICD-10-CM | POA: Diagnosis not present

## 2016-07-12 LAB — POCT RAPID STREP A (OFFICE): Rapid Strep A Screen: NEGATIVE

## 2016-07-12 MED ORDER — CLONIDINE HCL 0.1 MG PO TABS: 0.1000 mg | ORAL_TABLET | Freq: Three times a day (TID) | ORAL | 0 refills | Status: DC

## 2016-07-12 MED ORDER — BACLOFEN 20 MG PO TABS: ORAL_TABLET | ORAL | 0 refills | Status: DC

## 2016-07-12 MED ORDER — AMLODIPINE BESYLATE 10 MG PO TABS: 10.0000 mg | ORAL_TABLET | Freq: Every day | ORAL | 0 refills | Status: DC

## 2016-07-12 NOTE — Addendum Note (Signed)
Addended by: Neldon LabellaMABE, HOLDEN S on: 07/12/2016 10:51 AM   Modules accepted: Orders

## 2016-07-12 NOTE — Telephone Encounter (Signed)
[facility] called stating that they received an incomplete medical records release form. She would like to know what all we would like sent over. Also, the end date of the form. Please advise.     Caller: Elease HashimotoPatricia, Administrator at [facility] Phone: [phone]

## 2016-07-12 NOTE — Patient Instructions (Addendum)
Your blood pressure is mild high today and you have no med in your systrem. I recommend you using the clonidine 0.1 mg twice daily and the amlodipine. You can hold your metoprolol.  Can continue you baclofen that rehab wrote.(you can just use one tablet at night. Rehab had wrote you for more frequent)  For your chronic mild throat dc with recent mild pain will get a rapid strep test and send out test if rapid negative. May refer you to ENT.  I do want you to attend out patient rehab and get sponsor.  Follow up in 2 weeks or as needed

## 2016-07-12 NOTE — Telephone Encounter (Signed)
I spoke with Walter Nichols and she stated that we would need to be specific on what dates and all the information that was needed by the provider would have to be put correctly on the form. I advised her that we would fix the papework and send the completed form again. She voices understanding.

## 2016-07-12 NOTE — Progress Notes (Signed)
Pre visit review using our clinic review tool, if applicable. No additional management support is needed unless otherwise documented below in the visit note. 

## 2016-07-12 NOTE — Progress Notes (Signed)
Subjective:    Patient ID: Walter Nichols, male    DOB: 08/08/1971, 45 y.o.   MRN: [id number]  HPI  Pt in for evaluation/check on his bp. [facility] [place] [facility] [place] [facility] This was dx in rehab. Pt states he has not taken any of his blood pressure meds since the weekend. Pt went to clonidine 0.1 mg tid, amlodipine 10 mg a day. Also he was on metoprolol 50 mg a day. No cardiac or neurologic signs or symptoms.  Pt states he went to detoxification/recovery for alcohol and drug use. [facility] [place]. Pt had been using alcohol and cocaine.   Pt state he was drinking 2 beers a day and then coccaine about 1 gram of cocaine every 2 weeks.   (He got back in town this Thursday) Pt is now going through out patient therapy. He has a 10 o'clock appointment.  During rehab his kids were with his parents. Pt children are in therapy. Pt is as single. Dad.  Pt also mentioned that his throat is little sore recently. No fever, no chills or body aches. No ha.  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     Review of Systems  Constitutional: Negative for chills, fatigue and fever.  HENT: Negative for congestion, ear pain, nosebleeds, postnasal drip, sinus pressure and sore throat.   Respiratory: Negative for cough, chest tightness and shortness of breath.   Cardiovascular: Negative for chest pain and palpitations.  Gastrointestinal: Negative for abdominal pain, diarrhea and vomiting.  Musculoskeletal: Negative for arthralgias, back pain, joint swelling, myalgias and neck stiffness.  Skin: Negative for rash.  Neurological: Negative for dizziness, weakness, light-headedness, numbness and headaches.  Hematological: Negative for adenopathy. Does not bruise/bleed easily.  Psychiatric/Behavioral: Negative for agitation, confusion, dysphoric mood, hallucinations, self-injury and suicidal ideas. The patient is not nervous/anxious.     Past Medical History:    Diagnosis Date  . Anxiety   . Depression   . Migraine      Social History   Social History  . Marital status: Single    Spouse name: N/A  . Number of children: N/A  . Years of education: N/A   Occupational History  . Not on file.   Social History Main Topics  . Smoking status: Current Every Day Smoker  . Smokeless tobacco: Not on file  . Alcohol use Yes     Comment: 40 oz a day  . Drug use: No     Comment: in the past used coccaine. 3 yrs ago.  Marland Kitchen. Sexual activity: No   Other Topics Concern  . Not on file   Social History Narrative  . No narrative on file    Past Surgical History:  Procedure Laterality Date  . HEMORROIDECTOMY      No family history on file.  No Known Allergies  Current Outpatient Prescriptions on File Prior to Visit  Medication Sig Dispense Refill  . ALPRAZolam (XANAX) 0.5 MG tablet Take 1 tablet (0.5 mg total) by mouth 2 (two) times daily as needed for anxiety. 30 tablet 1  . docusate sodium (COLACE) 100 MG capsule Take 100 mg by mouth 2 (two) times daily.    . Multiple Vitamins-Minerals (MULTIVITAMIN WITH MINERALS) tablet Take 1 tablet by mouth daily.     No current facility-administered medications on file prior to visit.     BP (!) 138/91 (BP Location: Right Arm)   Pulse 76   Temp 98.1  F (36.7 C) (Oral)   Ht 6' 2.5" (1.892 m)   Wt 270 lb 9.6 oz (122.7 kg)   SpO2 93%   BMI 34.28 kg/m       Objective:   Physical Exam  General Mental Status- Alert. General Appearance- Not in acute distress.     HEENT Head- Normal. Ear Auditory Canal - Left- Normal. Right - Normal.Tympanic Membrane- Left- Normal. Right- Normal. Eye Sclera/Conjunctiva- Left- Normal. Right- Normal. Nose & Sinuses Nasal Mucosa- Left-  Boggy and Congested. Right-  Boggy and  Congested.Bilateral maxillary and frontal sinus pressure. Mouth & Throat Lips: Upper Lip- Normal: no dryness, cracking, pallor, cyanosis, or vesicular eruption. Lower Lip-Normal: no  dryness, cracking, pallor, cyanosis or vesicular eruption. Buccal Mucosa- Bilateral- No Aphthous ulcers. Oropharynx- No Discharge or Erythema. Tonsils: Characteristics- Bilateral- mild Erythema and Congestion. Size/Enlargement- Bilateral- No enlargement. Discharge- bilateral-None.  Neck Neck- Supple. No Masses.   Chest and Lung Exam Auscultation: Breath Sounds:-Clear even and unlabored.  Cardiovascular Auscultation:Rythm- Regular, rate and rhythm. Murmurs & Other Heart Sounds:Ausculatation of the heart reveal- No Murmurs.  Lymphatic Head & Neck General Head & Neck Lymphatics: Bilateral: Description- No Localized lymphadenopathy.    Chest and Lung Exam Auscultation: Breath Sounds:-Normal.  Cardiovascular Auscultation:Rythm- Regular. Murmurs & Other Heart Sounds:Auscultation of the heart reveals- No Murmurs.  Abdomen Inspection:-Inspeection Normal. Palpation/Percussion:Note:No mass. Palpation and Percussion of the abdomen reveal- Non Tender, Non Distended + BS, no rebound or guarding.   Neurologic Cranial Nerve exam:- CN III-XII intact(No nystagmus), symmetric smile. Strength:- 5/5 equal and symmetric strength both upper and lower extremities.  Pt has chronic history of chronic small white dc.      Assessment & Plan:  Your blood pressure is mild high today and you have no med in your systrem. I recommend you using the clonidine 0.1 mg twice daily and the amlodipine. You can hold your metoprolol.  Can continue you baclofen that rehab wrote. (you can just use one tablet at night. Rehab had wrote you for more frequent)  For your chronic mild throat dc with recent mild pain will get a rapid strep test and send out test if rapid negative. May refer you to ENT.  I do want you to attend out patient rehab and get sponsor.  Follow up in 2 weeks or as needed  Kazuma Elena, Ramon DredgeEdward, [employer]-C

## 2016-07-14 LAB — CULTURE, GROUP A STREP: ORGANISM ID, BACTERIA: NORMAL

## 2016-07-27 ENCOUNTER — Ambulatory Visit (INDEPENDENT_AMBULATORY_CARE_PROVIDER_SITE_OTHER): Payer: Managed Care, Other (non HMO) | Admitting: Medical

## 2016-07-27 ENCOUNTER — Encounter: Payer: Self-pay | Admitting: Medical

## 2016-07-27 VITALS — BP 130/80 | HR 68 | Temp 97.4°F | Ht 72.0 in | Wt 269.4 lb

## 2016-07-27 DIAGNOSIS — J312 Chronic pharyngitis: Secondary | ICD-10-CM | POA: Diagnosis not present

## 2016-07-27 DIAGNOSIS — Z87898 Personal history of other specified conditions: Secondary | ICD-10-CM | POA: Diagnosis not present

## 2016-07-27 DIAGNOSIS — I10 Essential (primary) hypertension: Secondary | ICD-10-CM

## 2016-07-27 DIAGNOSIS — F1911 Other psychoactive substance abuse, in remission: Secondary | ICD-10-CM

## 2016-07-27 MED ORDER — AMLODIPINE BESYLATE 10 MG PO TABS: 10.0000 mg | ORAL_TABLET | Freq: Every day | ORAL | 3 refills | Status: DC

## 2016-07-27 MED ORDER — CLONIDINE HCL 0.1 MG PO TABS: 0.1000 mg | ORAL_TABLET | Freq: Two times a day (BID) | ORAL | 3 refills | Status: DC

## 2016-07-27 NOTE — Progress Notes (Signed)
Subjective:    Patient ID: Walter Nichols, male    DOB: 06-09-1971, 45 y.o.   MRN: [id number]  HPI   Pt in for bp. BP is better. Pt is taking his bp meds. No cardiac or neurologic signs or symptoms.  Pt in states he is feeling better Tai Chi and yoga. He states his core muscles were very sore. Also some upper extremity soreness. Pt got temporary sponsor yesterday due to his substance abuse. Pt will meet sponsor today. He is also meeting with outpt treatment center [facility] 3 times a week. Pt states not having drug cravings but slight urge/low desire to drink alcohol. Pt canceled out all his number contacts in phone for drug use.  Some stress recently with kids. Not doing school work. He took their  devices away.  Still faint throat irritation and occasional dc from throat. See last note.    Review of Systems  Constitutional: Negative for chills, fatigue and fever.  Respiratory: Negative for cough, choking, chest tightness, shortness of breath and wheezing.   Cardiovascular: Negative for chest pain and palpitations.  Gastrointestinal: Negative for abdominal pain.  Musculoskeletal:       Muscle soreness related to new onset exercise.  Skin: Negative for rash.  Neurological: Negative for dizziness, seizures, syncope, speech difficulty, weakness, light-headedness and headaches.  Hematological: Negative for adenopathy. Does not bruise/bleed easily.  Psychiatric/Behavioral: Negative for behavioral problems, confusion, dysphoric mood, sleep disturbance and suicidal ideas. The patient is not nervous/anxious.     Past Medical History:  Diagnosis Date  . Anxiety   . Depression   . Migraine      Social History   Social History  . Marital status: Single    Spouse name: N/A  . Number of children: N/A  . Years of education: N/A   Occupational History  . Not on file.   Social History Main Topics  . Smoking status: Current Every Day Smoker  . Smokeless tobacco: Not on file  .  Alcohol use Yes     Comment: 40 oz a day  . Drug use: No     Comment: in the past used coccaine. 3 yrs ago.  Marland Kitchen Sexual activity: No   Other Topics Concern  . Not on file   Social History Narrative  . No narrative on file    Past Surgical History:  Procedure Laterality Date  . HEMORROIDECTOMY      No family history on file.  No Known Allergies  Current Outpatient Prescriptions on File Prior to Visit  Medication Sig Dispense Refill  . ALPRAZolam (XANAX) 0.5 MG tablet Take 1 tablet (0.5 mg total) by mouth 2 (two) times daily as needed for anxiety. 30 tablet 1  . amLODipine (NORVASC) 10 MG tablet Take 1 tablet (10 mg total) by mouth daily. 30 tablet 0  . baclofen (LIORESAL) 20 MG tablet 1 tab po q hs 30 each 0  . cloNIDine (CATAPRES) 0.1 MG tablet Take 1 tablet (0.1 mg total) by mouth 3 (three) times daily. 60 tablet 0  . docusate sodium (COLACE) 100 MG capsule Take 100 mg by mouth 2 (two) times daily.    . Multiple Vitamins-Minerals (MULTIVITAMIN WITH MINERALS) tablet Take 1 tablet by mouth daily.     No current facility-administered medications on file prior to visit.     BP 130/80 (BP Location: Left Arm, Patient Position: Sitting, Cuff Size: Normal)   Pulse 68   Temp 97.4 F (36.3 C) (Oral)   Ht  6' (1.829 m)   Wt 269 lb 6.4 oz (122.2 kg)   SpO2 98%   BMI 36.54 kg/m       Objective:   Physical Exam  General Mental Status- Alert. General Appearance- Not in acute distress.   Neck Carotid Arteries- Normal color. Moisture- Normal Moisture. No carotid bruits. No JVD.   HEENT- neg except faint pink pharynx.   Chest and Lung Exam Auscultation: Breath Sounds:-Normal.  Cardiovascular Auscultation:Rythm- Regular. Murmurs & Other Heart Sounds:Auscultation of the heart reveals- No Murmurs.   Neurologic Cranial Nerve exam:- CN III-XII intact(No nystagmus), symmetric smile. Strength:- 5/5 equal and symmetric strength both upper and lower extremities.        Assessment & Plan:  Your blood pressure is better controlled today. Continue both your clonidine and amlodipine.  I am refilling your meds today.  For your substance abuse history continue your out pt treatment and meeting with your sponsor. Good job on quitting drug and alcohol use.   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  Follow up in one month or as needed   Guenther Dunshee, Percell Miller, [place]

## 2016-07-27 NOTE — Progress Notes (Signed)
Pre visit review using our clinic review tool, if applicable. No additional management support is needed unless otherwise documented below in the visit note. 

## 2016-07-27 NOTE — Patient Instructions (Addendum)
Your blood pressure is better controlled today. Continue both your clonidine and amlodipine. I am refilling your meds today.  For your substance abuse history continue your out pt treatment and meeting with your sponsor. Good job on quitting drug and alcohol use.   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

## 2016-07-29 ENCOUNTER — Telehealth: Payer: Self-pay | Admitting: Medical

## 2016-07-29 NOTE — Telephone Encounter (Signed)
Caller name: Relationship to patient: Self Can be reached: [phone] Pharmacy:  Reason for call: Patient request call back to find out if he can get provider to fax information to [employer] because he can not get his claim processed. States he needs his last 2 office visit notes faxed to them ASAP. States that if any other facilities have sent reports to provider he needs those sent also. Plse call patient if you need any additional information.  [employer] fax number: [phone]  Patient's claim Number is [id number]

## 2016-07-29 NOTE — Telephone Encounter (Addendum)
Pt wants his last 2 notes faxed over to [employer] he provided the number. Notes which mention his  Drug rehab. But he also mentioned if we had other notes from the rehab facility. I did review some about 10 days ago. I signed and sent those for scanning. But would you explain to pt that they are sent out for scanning but  then show in our computer later. Explain at times will be weeks to month before they show on media after sent to scanning. So would you ask him to get the drug rehab facility to send those records directly to [facility].

## 2016-07-30 NOTE — Telephone Encounter (Signed)
Actually correction. I did check in media and looks like psychiatric evaluation in media scanned on 07-20-2016 related to his rehab and substance abuse. So can send that to [employer]. Just make sure we have authorization to do so. He asked us to send. Does he need to sign any document?

## 2016-08-02 NOTE — Telephone Encounter (Signed)
Pt is requesting a call back from you stating he need to talk to you in regards to the situation with [employer].

## 2016-08-02 NOTE — Telephone Encounter (Signed)
I spoke with the patient and he states that he wants to speak with the doctor. The patient states that he has signed all over all the paperwork from all the facilities that we need to have information to continue his care. He states that his provider is aware of his information.    Patient is requesting a call back.

## 2016-08-02 NOTE — Telephone Encounter (Signed)
Pt has signed release forms for 3 organizations that are releasing info regarding his recent rehab. Will you call all three offices and make sure we have all records. [employer] needs all this info to pay his claim.Pt may call you next week to see if we have all those records. Let me know that these places are cooperating in getting records.

## 2016-08-03 NOTE — Telephone Encounter (Signed)
Called patient and informed him that he would need to contact the other facilities to get their records because we cannot release other offices records we do not keep the entire record only what pertains to the care we provide. Patient was ok with that and said he would reach out to other offices to get the records. [facility] listed below

## 2016-08-16 ENCOUNTER — Telehealth: Payer: Self-pay | Admitting: Medical

## 2016-08-16 NOTE — Telephone Encounter (Signed)
Caller name: Relationship to patient: Self Can be reached: [phone] Pharmacy:  Reason for call: Patient called to request a letter from the provider that will keep [employer] from disconnecting his power tomorrow. States he does not know if there is anything that can be done, but will appreciate whatever provider can do

## 2016-08-16 NOTE — Telephone Encounter (Signed)
Will you notify [name] that I wrote letter for him asking [employer] not turn off electricity. Go over wording with him on letter and ask if ok the way I worded regarding his inability to work(no specifics given).

## 2016-08-16 NOTE — Telephone Encounter (Signed)
Notified pt that his letter would be ready at the front for pickup and he voices understanding.

## 2016-08-16 NOTE — Telephone Encounter (Signed)
Please advise.  Patient states that he has called [employer] and they told him that he would need to give a letter to keep the power on due to him taking care of his children at this time so they will not have to freeze.

## 2016-08-20 ENCOUNTER — Telehealth: Payer: Self-pay | Admitting: Medical

## 2016-08-20 ENCOUNTER — Ambulatory Visit (INDEPENDENT_AMBULATORY_CARE_PROVIDER_SITE_OTHER): Payer: Managed Care, Other (non HMO) | Admitting: Medical

## 2016-08-20 ENCOUNTER — Encounter: Payer: Self-pay | Admitting: Medical

## 2016-08-20 VITALS — BP 130/78 | HR 88 | Temp 98.1°F | Ht 72.0 in | Wt 269.8 lb

## 2016-08-20 DIAGNOSIS — M549 Dorsalgia, unspecified: Secondary | ICD-10-CM

## 2016-08-20 DIAGNOSIS — F17200 Nicotine dependence, unspecified, uncomplicated: Secondary | ICD-10-CM

## 2016-08-20 DIAGNOSIS — Z87891 Personal history of nicotine dependence: Secondary | ICD-10-CM

## 2016-08-20 DIAGNOSIS — F1911 Other psychoactive substance abuse, in remission: Secondary | ICD-10-CM

## 2016-08-20 DIAGNOSIS — R0781 Pleurodynia: Secondary | ICD-10-CM | POA: Diagnosis not present

## 2016-08-20 DIAGNOSIS — Z87898 Personal history of other specified conditions: Secondary | ICD-10-CM | POA: Diagnosis not present

## 2016-08-20 MED ORDER — DICLOFENAC SODIUM 75 MG PO TBEC: 75.0000 mg | DELAYED_RELEASE_TABLET | Freq: Two times a day (BID) | ORAL | 0 refills | Status: DC

## 2016-08-20 MED ORDER — BUPROPION HCL ER (SR) 150 MG PO TB12: ORAL_TABLET | ORAL | 1 refills | Status: DC

## 2016-08-20 NOTE — Progress Notes (Signed)
Pre visit review using our clinic review tool, if applicable. No additional management support is needed unless otherwise documented below in the visit note. 

## 2016-08-20 NOTE — Telephone Encounter (Signed)
Caller name: Enid Derrythan Relation to pt: self Call back number: [phone] Pharmacy:  Reason for call: Pt was informing the fax number to have his documents sent to [facility] fax [phone], short term disability paperwork to be fax to the number mentioned. Please advise.

## 2016-08-20 NOTE — Progress Notes (Signed)
Subjective:    Patient ID: Walter Nichols, male    DOB: 08/18/1971, 45 y.o.   MRN: [id number]  HPI Pt updates me that having financial problems related to him not working. [facility] Pt is scheduled to attend there until Sep 27, 2016(but they told him to take extra  2 week or so before working to get him adjusted). He has been out since Sept 29, 2017 when he started to make arrangnement for detox and eventually went [place] [facility] patient [facility]hab  [place]. [facility] in [facility] in [facility] and he now has Sponsor. August 24, 2016 will have 60 days clean.   Pt in for evaluation of back pain. When he had to move treadmill of neighbor he go some back pain. He helped her assemble machine as well. Pt had pain for 4 days. Later that day started to get back pain. He points to left latisumss area. When he breaths has some pain.    Review of Systems  Constitutional: Negative for chills, fatigue and fever.  Respiratory: Negative for cough, chest tightness, shortness of breath and wheezing.   Cardiovascular: Negative for chest pain and palpitations.  Gastrointestinal: Negative for abdominal pain.  Musculoskeletal: Negative for back pain and neck pain.       Left latisimuss dorsi pain. Has tried advil.  Skin: Negative for rash.       Some pain on deep inspiration.  Neurological: Negative for dizziness, seizures, syncope, weakness and headaches.  Hematological: Negative for adenopathy. Does not bruise/bleed easily.  Psychiatric/Behavioral: Negative for agitation, behavioral problems, confusion and sleep disturbance. The patient is not nervous/anxious.        Pt is seems to be doing moderately well despite his situation and financial issues.    Past Medical History:  Diagnosis Date  . Anxiety   . Depression   . Migraine      Social History   Social History  . Marital status: Single    Spouse name: N/A  . Number of children: N/A  . Years of education:  N/A   Occupational History  . Not on file.   Social History Main Topics  . Smoking status: Current Every Day Smoker  . Smokeless tobacco: Not on file  . Alcohol use Yes     Comment: 40 oz a day  . Drug use: No     Comment: in the past used coccaine. 3 yrs ago.  Marland Kitchen. Sexual activity: No   Other Topics Concern  . Not on file   Social History Narrative  . No narrative on file    Past Surgical History:  Procedure Laterality Date  . HEMORROIDECTOMY      No family history on file.  No Known Allergies  Current Outpatient Prescriptions on File Prior to Visit  Medication Sig Dispense Refill  . ALPRAZolam (XANAX) 0.5 MG tablet Take 1 tablet (0.5 mg total) by mouth 2 (two) times daily as needed for anxiety. 30 tablet 1  . amLODipine (NORVASC) 10 MG tablet Take 1 tablet (10 mg total) by mouth daily. 30 tablet 3  . baclofen (LIORESAL) 20 MG tablet 1 tab po q hs 30 each 0  . cloNIDine (CATAPRES) 0.1 MG tablet Take 1 tablet (0.1 mg total) by mouth 2 (two) times daily. 60 tablet 3  . docusate sodium (COLACE) 100 MG capsule Take 100 mg by mouth 2 (two) times daily.    . Multiple Vitamins-Minerals (MULTIVITAMIN WITH  MINERALS) tablet Take 1 tablet by mouth daily.     No current facility-administered medications on file prior to visit.     BP 130/78 (BP Location: Left Arm, Patient Position: Sitting, Cuff Size: Normal)   Pulse 88   Temp 98.1 F (36.7 C) (Oral)   Ht 6' (1.829 m)   Wt 269 lb 12.8 oz (122.4 kg)   SpO2 98%   BMI 36.59 kg/m       Objective:   Physical Exam  General Mental Status- Alert. General Appearance- Not in acute distress.   Skin General: Color- Normal Color. Moisture- Normal Moisture.  Neck Carotid Arteries- Normal color. Moisture- Normal Moisture. No carotid bruits. No JVD.  Chest and Lung Exam Auscultation: Breath Sounds:-Normal.  Cardiovascular Auscultation:Rythm- Regular. Murmurs & Other Heart Sounds:Auscultation of the heart reveals- No  Murmurs.  Abdomen Inspection:-Inspeection Normal. Palpation/Percussion:Note:No mass. Palpation and Percussion of the abdomen reveal- Non Tender, Non Distended + BS, no rebound or guarding.  Neurologic Cranial Nerve exam:- CN III-XII intact(No nystagmus), symmetric smile. Strength:- 5/5 equal and symmetric strength both upper and lower extremities.  Back- no direct back pain on palpation. Pain is located left latisimuss dorsi area.      Assessment & Plan:  For your substance abuse history continue to attend out patient rehab.   For pain will rx diclofenac. Stop the ibuprofen or alleve while on diclofenac.  For smoking cessation rx Wellbutrin.  Follow up in 3 wks or as needed  Letter written explaining his situation to his disability company.  I gave him copy of letter. When he gives us fax number we will fax over letter. Asked him to check if they received by Monday. If not then asked him to fax letter himself. Let us know if he is having issues.   Avilyn Virtue, Ramon DredgeEdward, PA-C

## 2016-08-20 NOTE — Patient Instructions (Addendum)
For your substance abuse history continue to attend out patient rehab.   For pain will rx diclofenac. Stop the ibuprofen or alleve while on diclofenac.  For smoking cessation rx Wellbutrin.  Follow up in 3 wks or as needed

## 2016-08-23 NOTE — Telephone Encounter (Signed)
I spoke with the patient and he voices understanding. Patient states that his case-worker is on vacation at this time and will not be back until next Monday. 08/30/16. He states that he spoke with the person that is covering the case at this time and he states they told the patient that they did have all of his paperwork and I advised him that if any other information is needed [employer] will need to fax over any other paperwork if necessary. Pt voices understanding.

## 2016-08-24 ENCOUNTER — Ambulatory Visit: Payer: Self-pay | Admitting: Medical

## 2016-09-08 ENCOUNTER — Telehealth: Payer: Self-pay | Admitting: Medical

## 2016-09-08 NOTE — Telephone Encounter (Signed)
Called to remind patient of appt.  Pt did not pick up. Was unable to leave a voice message due to voice mailbox not being set up yet.

## 2016-09-08 NOTE — Telephone Encounter (Signed)
Pt scheduled to come in on this Friday. I don't think he will miss appointment but please call and remind him to come in. Need to discuss form that I think I can fill out. But also want any psychiatrist and his psychologist to fill out as well.

## 2016-09-09 NOTE — Telephone Encounter (Signed)
Patient confirmed appointment when called this morning 09/09/16

## 2016-09-09 NOTE — Telephone Encounter (Addendum)
Please call patient and remind him of his appt tomorrow (09/10/16).

## 2016-09-10 ENCOUNTER — Ambulatory Visit (INDEPENDENT_AMBULATORY_CARE_PROVIDER_SITE_OTHER): Payer: Managed Care, Other (non HMO) | Admitting: Medical

## 2016-09-10 ENCOUNTER — Encounter: Payer: Self-pay | Admitting: Medical

## 2016-09-10 VITALS — BP 137/86 | HR 66 | Temp 98.3°F | Ht 72.0 in | Wt 270.0 lb

## 2016-09-10 DIAGNOSIS — Z87898 Personal history of other specified conditions: Secondary | ICD-10-CM

## 2016-09-10 DIAGNOSIS — F1911 Other psychoactive substance abuse, in remission: Secondary | ICD-10-CM

## 2016-09-10 NOTE — Patient Instructions (Signed)
I want you to continue to attend all your substance abuse recovery meetings. I will fill out your [employer] forms and gave you copy of sheets so therapist, and all psychiatrist you have seen can fill out form as well.(both one you will see on Sep 23, 2016 and the psychiatrist at [facility] to fill out forms as well.)  I will try to finish form by Wednesday of next week and send fax.  Continue current medications.  Follow up 1-12-218 or as needed

## 2016-09-10 NOTE — Progress Notes (Signed)
Pre visit review using our clinic tool,if applicable. No additional management support is needed unless otherwise documented below in the visit note.  

## 2016-09-10 NOTE — Progress Notes (Signed)
Subjective:    Patient ID: Walter Nichols, male    DOB: 11/28/1970, 45 y.o.   MRN: [id number]  HPI   Pt in for follow up.   He is in for discussion on his substance abuse recovery. He has been waiting on getting some short term disability from company. He had been asking for records from us and I had asked pt to get records from other facilities but now I just got [employer]flife form to fill out.   Pt summarizes that his out pt recovery was Monday Wed, Thursday. This was noon- 3 pm(or 6-9 pm). These meeting continue  Pt would attend NA meeting and AA meeting. He would attend on Tuesday 1-2 pm. Monday, Wed and Thursday. would attend [facility] 6-7 pm.   On weekends attend meeting Saturday 10-11 amNA meeting. On Sunday evening meets with his sponsor 6-7 pm.  Pt work hours previously 1 pm-10 pm.(so he describes obvious time conflict. Pt expresses that with all meeting and being single parent that trying to work would have been extremely difficult if not impossible.   He expressed if he had worked and done recovery  that the cumulative stress may have pushed him to relapse of substance abuse. Pt states doing well now and very optimistic. He feels come Oct 14, 2016 he will be able to return to work with no restrictions.  Pt has been seeing therapist once a week for one hour. Pt will see psychiatrist on September 23, 2016.  Pt has abstained from both drugs and alcohol. Drug test have been all negative per his report. Pt missed few meeting when he got sick URI type symptoms over holiday.  Regarding pt not working. Pt did get payment from disability up to August 20, 2016. Pt states [place] but he wanted to outpaitent since he needed to be with his family.   Pt 90 days clean/sober will September 24, 2016.  Pt bp is controlled today.(Pt is on catapress and amlodipine)  Pt states [facility] originally wrote him for baclofen. He continues  this.  I had written wellbutrin for smoking cessation. He smokes less but still smokes. Currently still taking.  He is not reporting depression or anxiety today.     Review of Systems  Constitutional: Negative for chills, fatigue and fever.  Respiratory: Negative for cough, chest tightness, shortness of breath and wheezing.   Cardiovascular: Negative for chest pain and palpitations.  Gastrointestinal: Negative for abdominal pain.  Musculoskeletal: Negative for back pain.  Skin: Negative for rash.  Neurological: Negative for dizziness and headaches.  Hematological: Negative for adenopathy. Does not bruise/bleed easily.  Psychiatric/Behavioral: Negative for dysphoric mood, self-injury and suicidal ideas. The patient is not nervous/anxious.        Some stress and describes feeling distracted with all his meetings. He expresses to me today that he could not have worked and attended all his outpt rehab acitivities.    Past Medical History:  Diagnosis Date  . Anxiety   . Depression   . Migraine      Social History   Social History  . Marital status: Single    Spouse name: N/A  . Number of children: N/A  . Years of education: N/A   Occupational History  . Not on file.   Social History Main Topics  . Smoking status: Current Every Day Smoker  . Smokeless tobacco: Not on file  . Alcohol use Yes  Comment: 40 oz a day  . Drug use: No     Comment: in the past used coccaine. 3 yrs ago.  Marland Kitchen. Sexual activity: No   Other Topics Concern  . Not on file   Social History Narrative  . No narrative on file    Past Surgical History:  Procedure Laterality Date  . HEMORROIDECTOMY      No family history on file.  No Known Allergies  Current Outpatient Prescriptions on File Prior to Visit  Medication Sig Dispense Refill  . ALPRAZolam (XANAX) 0.5 MG tablet Take 1 tablet (0.5 mg total) by mouth 2 (two) times daily as needed for anxiety. 30 tablet 1  . amLODipine (NORVASC) 10 MG  tablet Take 1 tablet (10 mg total) by mouth daily. 30 tablet 3  . baclofen (LIORESAL) 20 MG tablet 1 tab po q hs 30 each 0  . buPROPion (WELLBUTRIN SR) 150 MG 12 hr tablet 1 tab po q day for 3 days then 1 tab po bid 63 tablet 1  . cloNIDine (CATAPRES) 0.1 MG tablet Take 1 tablet (0.1 mg total) by mouth 2 (two) times daily. 60 tablet 3  . diclofenac (VOLTAREN) 75 MG EC tablet Take 1 tablet (75 mg total) by mouth 2 (two) times daily. 30 tablet 0  . docusate sodium (COLACE) 100 MG capsule Take 100 mg by mouth 2 (two) times daily.    . Multiple Vitamins-Minerals (MULTIVITAMIN WITH MINERALS) tablet Take 1 tablet by mouth daily.     No current facility-administered medications on file prior to visit.     BP 137/86   Pulse 66   Temp 98.3 F (36.8 C) (Oral)   Ht 6' (1.829 m)   Wt 270 lb (122.5 kg)   SpO2 99%   BMI 36.62 kg/m       Objective:   Physical Exam  General- No acute distress. Pleasant patient. Good affect today. Neck- Full range of motion, no jvd Lungs- Clear, even and unlabored. Heart- regular rate and rhythm. Neurologic- CNII- XII grossly intact.       Assessment & Plan:  I want you to continue to attend all your substance abuse recovery meetings. I will fill out your [employer] forms and gave you copy of sheets so therapist, and all psychiatrist you have seen can fill out form as well.(both one you will see on Sep 23, 2016 and the psychiatrist at [facility] to fill out forms as well.)  I will try to finish form by Wednesday of next week and send fax.  Continue current medications.  Follow up 1-12-218 or as needed

## 2016-09-14 ENCOUNTER — Telehealth: Payer: Self-pay | Admitting: Medical

## 2016-09-14 NOTE — Telephone Encounter (Signed)
I filled out [employer] short disability form and go Dr. Laury AxonLowne to [facility] as well. Gave forms  to Jasmine DecemberSharon to [facility] and keep copy so we can scan.

## 2016-09-24 ENCOUNTER — Ambulatory Visit: Payer: Self-pay | Admitting: Medical

## 2016-09-24 ENCOUNTER — Telehealth: Payer: Self-pay | Admitting: Medical

## 2016-09-24 NOTE — Telephone Encounter (Signed)
No charge. 

## 2016-09-24 NOTE — Telephone Encounter (Signed)
Pt lvm at 7:08 canceling his appt. He says that his daughter isnt feeling well.

## 2016-10-16 ENCOUNTER — Other Ambulatory Visit: Payer: Self-pay | Admitting: Medical

## 2017-05-09 ENCOUNTER — Ambulatory Visit (INDEPENDENT_AMBULATORY_CARE_PROVIDER_SITE_OTHER): Payer: 59 | Admitting: Medical

## 2017-05-09 ENCOUNTER — Encounter: Payer: Self-pay | Admitting: Medical

## 2017-05-09 VITALS — BP 134/89 | HR 65 | Temp 98.1°F | Resp 16 | Ht 74.0 in | Wt 275.8 lb

## 2017-05-09 DIAGNOSIS — M25511 Pain in right shoulder: Secondary | ICD-10-CM

## 2017-05-09 DIAGNOSIS — M25512 Pain in left shoulder: Secondary | ICD-10-CM | POA: Diagnosis not present

## 2017-05-09 MED ORDER — DICLOFENAC SODIUM 75 MG PO TBEC: 75.0000 mg | DELAYED_RELEASE_TABLET | Freq: Two times a day (BID) | ORAL | 0 refills | Status: DC

## 2017-05-09 NOTE — Progress Notes (Signed)
Subjective:    Patient ID: Walter Nichols, male    DOB: [birth year], 46 y.o.   MRN: [id number]  HPI  Pt in for evaluation on some recent shoulder pain.  He states has been working out about 3 days a week for the past 5 months.  He states he was noticing some shoulder pain on both sides. Rt side shoulder worse than left.  He states was doing some [facility] exercises when he noticed the pain.  Pt states he continue to remain sober and clean. He states his does not have a lot of craving. Not attend many meetings. His neighbor former addict. Pt states he has not talked to sponsor in a while.    Shoulder pains hurting for past month.  Shoulder are not getting better. Was playing basketball as well recently.    Review of Systems  Constitutional: Negative for chills, fatigue and fever.  Respiratory: Negative for chest tightness, shortness of breath and wheezing.   Cardiovascular: Negative for chest pain and palpitations.  Gastrointestinal: Negative for abdominal pain.  Musculoskeletal: Negative for back pain, joint swelling, myalgias and neck stiffness.       Shoulder pains.  Skin: Negative for rash.  Neurological: Negative for dizziness, syncope, weakness, numbness and headaches.  Hematological: Negative for adenopathy. Does not bruise/bleed easily.  Psychiatric/Behavioral: Negative for behavioral problems and confusion.    Past Medical History:  Diagnosis Date  . Anxiety   . Depression   . Migraine      Social History   Social History  . Marital status: Single    Spouse name: N/A  . Number of children: N/A  . Years of education: N/A   Occupational History  . Not on file.   Social History Main Topics  . Smoking status: Current Every Day Smoker  . Smokeless tobacco: Never Used  . Alcohol use Yes     Comment: 40 oz a day  . Drug use: No     Comment: in the past used coccaine. 3 yrs ago.  Marland Kitchen Sexual activity: No   Other Topics Concern  . Not on file   Social  History Narrative  . No narrative on file    Past Surgical History:  Procedure Laterality Date  . HEMORROIDECTOMY      No family history on file.  No Known Allergies  Current Outpatient Prescriptions on File Prior to Visit  Medication Sig Dispense Refill  . Multiple Vitamins-Minerals (MULTIVITAMIN WITH MINERALS) tablet Take 1 tablet by mouth daily.    Marland Kitchen ALPRAZolam (XANAX) 0.5 MG tablet Take 1 tablet (0.5 mg total) by mouth 2 (two) times daily as needed for anxiety. (Patient not taking: Reported on 05/09/2017) 30 tablet 1  . amLODipine (NORVASC) 10 MG tablet Take 1 tablet (10 mg total) by mouth daily. (Patient not taking: Reported on 05/09/2017) 30 tablet 3  . baclofen (LIORESAL) 20 MG tablet 1 tab po q hs (Patient not taking: Reported on 05/09/2017) 30 each 0  . buPROPion (WELLBUTRIN SR) 150 MG 12 hr tablet TAKE 1 TAB BY MOUTH DAILY FOR 3 DAYS THEN 1 TAB TWICE DAILY (Patient not taking: Reported on 05/09/2017) 63 tablet 1  . cloNIDine (CATAPRES) 0.1 MG tablet Take 1 tablet (0.1 mg total) by mouth 2 (two) times daily. (Patient not taking: Reported on 05/09/2017) 60 tablet 3  . diclofenac (VOLTAREN) 75 MG EC tablet Take 1 tablet (75 mg total) by mouth 2 (two) times daily. (Patient not taking: Reported on 05/09/2017) 30 tablet  0  . docusate sodium (COLACE) 100 MG capsule Take 100 mg by mouth 2 (two) times daily.     No current facility-administered medications on file prior to visit.     BP 134/89   Pulse 65   Temp 98.1 F (36.7 C) (Oral)   Resp 16   Ht 6\' 2"  (1.88 m)   Wt 275 lb 12.8 oz (125.1 kg)   SpO2 98%   BMI 35.41 kg/m       Objective:   Physical Exam  General- No acute distress. Pleasant patient. Neck- Full range of motion, no jvd Lungs- Clear, even and unlabored. Heart- regular rate and rhythm. Neurologic- CNII- XII grossly intact.  Both shoulder- no pain on direct palpation. But on range of motion reports moderate pain. Pain most on abduction of rt shoulder.He  demonstrates that when his right upper extremity gets to shoulder level pain increases.       Assessment & Plan:  For your both side shoulder pain rx diclofenac but will also go ahead and refer you to sports medicine. I would recommend you investigate if sports medicine covered better than chiropractor. Sports med may be the better referral.  I think since pain more than a month referral would be a good idea.  Follow up 4-6 weeks or as needed (after sport med referral)

## 2017-05-09 NOTE — Patient Instructions (Addendum)
For your both side shoulder pain rx diclofenac but will also go ahead and refer you to sports medicine. I would recommend you investigate if sports medicine covered better than chiropractor. Sports med may be the better referral.  I think since pain more than a month referral would be a good idea.  Follow up 4-6 weeks or as needed(after sport med referral)

## 2017-05-19 ENCOUNTER — Ambulatory Visit: Payer: 59 | Admitting: Family Medicine

## 2017-08-02 ENCOUNTER — Encounter (HOSPITAL_COMMUNITY): Payer: Self-pay | Admitting: Emergency Medicine

## 2017-08-02 ENCOUNTER — Emergency Department (HOSPITAL_COMMUNITY): Payer: 59

## 2017-08-02 ENCOUNTER — Emergency Department (HOSPITAL_COMMUNITY)
Admission: EM | Admit: 2017-08-02 | Discharge: 2017-08-02 | Disposition: A | Discharge status: Home / Self Care | Payer: 59 | Attending: Emergency Medicine | Admitting: Emergency Medicine

## 2017-08-02 DIAGNOSIS — R200 Anesthesia of skin: Secondary | ICD-10-CM | POA: Diagnosis not present

## 2017-08-02 DIAGNOSIS — Z79899 Other long term (current) drug therapy: Secondary | ICD-10-CM | POA: Diagnosis not present

## 2017-08-02 DIAGNOSIS — R079 Chest pain, unspecified: Secondary | ICD-10-CM | POA: Insufficient documentation

## 2017-08-02 DIAGNOSIS — R61 Generalized hyperhidrosis: Secondary | ICD-10-CM | POA: Insufficient documentation

## 2017-08-02 DIAGNOSIS — F17200 Nicotine dependence, unspecified, uncomplicated: Secondary | ICD-10-CM | POA: Diagnosis not present

## 2017-08-02 DIAGNOSIS — M79602 Pain in left arm: Secondary | ICD-10-CM | POA: Diagnosis not present

## 2017-08-02 DIAGNOSIS — I10 Essential (primary) hypertension: Secondary | ICD-10-CM | POA: Diagnosis not present

## 2017-08-02 LAB — CBC
HEMATOCRIT: 41.2 % (ref 39.0–52.0)
HEMOGLOBIN: 14 g/dL (ref 13.0–17.0)
MCH: 28.3 pg (ref 26.0–34.0)
MCHC: 34 g/dL (ref 30.0–36.0)
MCV: 83.4 fL (ref 78.0–100.0)
Platelets: 272 10*3/uL (ref 150–400)
RBC: 4.94 MIL/uL (ref 4.22–5.81)
RDW: 13.7 % (ref 11.5–15.5)
WBC: 7.2 10*3/uL (ref 4.0–10.5)

## 2017-08-02 LAB — BASIC METABOLIC PANEL
ANION GAP: 6 (ref 5–15)
BUN: 10 mg/dL (ref 6–20)
CALCIUM: 9.3 mg/dL (ref 8.9–10.3)
CO2: 23 mmol/L (ref 22–32)
Chloride: 107 mmol/L (ref 101–111)
Creatinine, Ser: 1.19 mg/dL (ref 0.61–1.24)
Glucose, Bld: 130 mg/dL — ABNORMAL HIGH (ref 65–99)
POTASSIUM: 3.9 mmol/L (ref 3.5–5.1)
SODIUM: 136 mmol/L (ref 135–145)

## 2017-08-02 LAB — I-STAT TROPONIN, ED
TROPONIN I, POC: 0 ng/mL (ref 0.00–0.08)
TROPONIN I, POC: 0.01 ng/mL (ref 0.00–0.08)

## 2017-08-02 NOTE — ED Triage Notes (Signed)
Pt c/o centralized chest pain and left arm numbness/tingling that lasted about 45 minutes today. Pt also c/o diaphoresis and chills. No complaints at this time.

## 2017-08-02 NOTE — ED Notes (Signed)
Called pt to be triaged with no response.

## 2017-08-02 NOTE — ED Notes (Signed)
Patient verbalizes understanding of discharge instructions. Opportunity for questioning and answers were provided. 

## 2017-08-02 NOTE — Discharge Instructions (Signed)
Start taking an 81 mg low-dose aspirin daily.  Please follow with your primary care doctor in the next 2 days for a check-up. They must obtain records for further management.   Do not hesitate to return to the Emergency Department for any new, worsening or concerning symptoms.

## 2017-08-03 NOTE — ED Provider Notes (Signed)
[facility] EMERGENCY DEPARTMENT Provider Note   CSN: [id number] Arrival date & time: 08/02/17  1926     [facility]   [facility]  Patient presents with  . Chest Pain    HPI   Blood pressure 116/66, pulse (!) 47, temperature 98.1 F (36.7 C), temperature source Oral, resp. rate 11, height 6\' 2"  (1.88 m), weight 127 kg (280 lb), SpO2 100 %.  Walter Nichols is a 46 y.o. male with past medical history significant for hypertension and tobacco use complaining of a left-sided chest pain which he described as a severe heartburn.  It lasts only for a few seconds but it came approximately 3 times a minute for a total of 30 minutes at 2 PM today while he was sitting at his desk typing.   He states that he had an ache in his bicep and some numbness in the left hand.  He is right-hand dominant.  This pain lasted for approximately 30 minutes and was associated with the diaphoresis and no shortness of breath or nausea.  He had a similar but less severe pain 3 days ago when he was watching his kids at home.  He does not think that the pain is exacerbated by exertion, position, deep breaths or coughing.  He has been chest pain-free since 3 PM.  He has no family history of ACS.  He has never seen a cardiologist or had any provocative Tory testing.   Past Medical History:  Diagnosis Date  . Anxiety   . Depression   . Migraine     Patient Active Problem List   Diagnosis Date Noted  . Wellness examination 01/28/2016    Past Surgical History:  Procedure Laterality Date  . HEMORROIDECTOMY         Home Medications    Prior to Admission medications   Medication Sig Start Date End Date Taking? Authorizing Provider  ALPRAZolam Prudy Feeler(XANAX) 0.5 MG tablet Take 1 tablet (0.5 mg total) by mouth 2 (two) times daily as needed for anxiety. Patient not taking: Reported on 05/09/2017 02/06/16   Saguier, Ramon DredgeEdward, PA-C  amLODipine (NORVASC) 10 MG tablet Take 1 tablet (10 mg  total) by mouth daily. Patient not taking: Reported on 05/09/2017 07/27/16   Saguier, Ramon DredgeEdward, PA-C  baclofen (LIORESAL) 20 MG tablet 1 tab po q hs Patient not taking: Reported on 05/09/2017 07/12/16   Saguier, Ramon DredgeEdward, PA-C  buPROPion [facility](WELLBUTRIN SR) 150 MG 12 hr tablet TAKE 1 TAB BY MOUTH DAILY FOR 3 DAYS THEN 1 TAB TWICE DAILY Patient not taking: Reported on 05/09/2017 10/18/16   Saguier, Ramon DredgeEdward, PA-C  cloNIDine (CATAPRES) 0.1 MG tablet Take 1 tablet (0.1 mg total) by mouth 2 (two) times daily. Patient not taking: Reported on 05/09/2017 07/27/16   Saguier, Ramon DredgeEdward, PA-C  diclofenac (VOLTAREN) 75 MG EC tablet Take 1 tablet (75 mg total) by mouth 2 (two) times daily. 05/09/17   Saguier, Ramon DredgeEdward, PA-C  docusate sodium (COLACE) 100 MG capsule Take 100 mg by mouth 2 (two) times daily.    [provider]  Multiple Vitamins-Minerals (MULTIVITAMIN WITH MINERALS) tablet Take 1 tablet by mouth daily.    [provider]    Family History No family history on file.  Social History Social History   Tobacco Use  . Smoking status: Current Every Day Smoker  . Smokeless tobacco: Never Used  Substance Use Topics  . Alcohol use: Yes    Comment: 40 oz a day  . Drug use: No  Comment: in the past used coccaine. 3 yrs ago.     Allergies   Patient has no known allergies.   Review of Systems Review of Systems   A complete review of systems was obtained and all systems are negative except as noted in the HPI and PMH.    Physical Exam Updated Vital Signs BP 116/66   Pulse (!) 47   Temp 98.1 F (36.7 C) (Oral)   Resp 11   Ht 6\' 2"  (1.88 m)   Wt 127 kg (280 lb)   SpO2 100%   BMI 35.95 kg/m   Physical Exam  Constitutional: He is oriented to person, place, and time. He appears well-developed and well-nourished. No distress.  HENT:  Head: Normocephalic.  Mouth/Throat: Oropharynx is clear and moist.  Eyes: Conjunctivae are normal.  Neck: Normal range of motion. No JVD present.  No tracheal deviation present.  Cardiovascular: Normal rate, regular rhythm and intact distal pulses.  Radial pulse equal bilaterally  Pulmonary/Chest: Effort normal and breath sounds normal. No stridor. No respiratory distress. He has no wheezes. He has no rales. He exhibits no tenderness.  Abdominal: Soft. He exhibits no distension and no mass. There is no tenderness. There is no rebound and no guarding.  Musculoskeletal: Normal range of motion. He exhibits no edema or tenderness.  No calf asymmetry, superficial collaterals, palpable cords, edema, Homans sign negative bilaterally.    Neurological: He is alert and oriented to person, place, and time.  Skin: Skin is warm. He is not diaphoretic.  Psychiatric: He has a normal mood and affect.  Nursing note and vitals reviewed.    ED Treatments / Results  Labs (all labs ordered are listed, but only abnormal results are displayed) Labs Reviewed  BASIC METABOLIC PANEL - Abnormal; Notable for the following components:      Result Value   Glucose, Bld 130 (*)    All other components within normal limits  CBC  I-STAT TROPONIN, ED  I-STAT TROPONIN, ED    EKG  EKG Interpretation  Date/Time:  Tuesday August 02 2017 19:36:19 EST Ventricular Rate:  54 PR Interval:  174 QRS Duration: 86 QT Interval:  400 QTC Calculation: 379 R Axis:   39 Text Interpretation:  Sinus bradycardia with sinus arrhythmia Cannot rule out Anterior infarct , age undetermined Abnormal ECG Nonspecific T wave abnormality When compared with ECG of 01/07/2016, Nonspecific T wave abnormality is slightly more prominent Confirmed by Dione BoozeGlick, David ([id number]) on 08/02/2017 7:42:40 PM       Radiology Dg Chest 2 View  Result Date: 08/02/2017 CLINICAL DATA:  Left-sided chest pain for 1 day EXAM: CHEST  2 VIEW COMPARISON:  02/06/2016 FINDINGS: The heart size and mediastinal contours are within normal limits. Both lungs are clear. The visualized skeletal structures are  unremarkable. IMPRESSION: No active cardiopulmonary disease. Electronically Signed   By: Alcide CleverMark  Lukens M.D.   On: 08/02/2017 20:25    Procedures Procedures (including critical care time)  Medications Ordered in ED Medications - No data to display   Initial Impression / Assessment and Plan / ED Course  I have reviewed the triage vital signs and the nursing notes.  Pertinent labs & imaging results that were available during my care of the patient were reviewed by me and considered in my medical decision making (see chart for details).     Vitals:   08/02/17 1941 08/02/17 2216 08/02/17 2230 08/02/17 2245  BP: (!) 134/91 126/72 124/69 116/66  Pulse: (!) 55 (!) 53 Marland Kitchen(!)  46 (!) 47  Resp: 16 16 11 11   Temp: 98.1 F (36.7 C)     TempSrc: Oral     SpO2: 99% 100% 99% 100%  Weight: 127 kg (280 lb)     Height: 6\' 2"  (1.88 m)       Walter Nichols is 46 y.o. male presenting with nonexertional chest pain onset this afternoon, has been resolved for greater than 6 hours.  EKG was slightly more pronounced T wave abnormalities than prior.  Troponin negative.  Full dose aspirin administered at urgent care.  Patient has remained chest pain-free in the ED, delta troponin negative.  Cardiology referral given based on his risk factors.  We had an extensive discussion of return precautions and patient verbalized understanding and teach back technique.  Evaluation does not show pathology that would require ongoing emergent intervention or inpatient treatment. Pt is hemodynamically stable and mentating appropriately. Discussed findings and plan with patient/guardian, who agrees with care plan. All questions answered. Return precautions discussed and outpatient follow up given.      Final Clinical Impressions(s) / ED Diagnoses   Final diagnoses:  Nonspecific chest pain    ED Discharge Orders    None       Walter Nichols, Mardella Layman 08/03/17 0008    Dione Booze, MD 08/03/17 [id number]

## 2017-09-09 ENCOUNTER — Encounter: Payer: Self-pay | Admitting: Medical

## 2017-09-09 ENCOUNTER — Ambulatory Visit (INDEPENDENT_AMBULATORY_CARE_PROVIDER_SITE_OTHER): Payer: 59 | Admitting: Medical

## 2017-09-09 VITALS — BP 118/73 | HR 81 | Temp 98.7°F | Resp 16 | Ht 74.0 in | Wt 277.0 lb

## 2017-09-09 DIAGNOSIS — J111 Influenza due to unidentified influenza virus with other respiratory manifestations: Secondary | ICD-10-CM | POA: Diagnosis not present

## 2017-09-09 DIAGNOSIS — J020 Streptococcal pharyngitis: Secondary | ICD-10-CM | POA: Diagnosis not present

## 2017-09-09 LAB — POCT INFLUENZA A/B: INFLUENZA A, POC: NEGATIVE

## 2017-09-09 LAB — POCT RAPID STREP A (OFFICE): Rapid Strep A Screen: POSITIVE — AB

## 2017-09-09 MED ORDER — OSELTAMIVIR PHOSPHATE 75 MG PO CAPS: 75.0000 mg | ORAL_CAPSULE | Freq: Two times a day (BID) | ORAL | 0 refills | Status: DC

## 2017-09-09 MED ORDER — BENZONATATE 100 MG PO CAPS: 100.0000 mg | ORAL_CAPSULE | Freq: Three times a day (TID) | ORAL | 0 refills | Status: DC | PRN

## 2017-09-09 MED ORDER — AMOXICILLIN 875 MG PO TABS: 875.0000 mg | ORAL_TABLET | Freq: Two times a day (BID) | ORAL | 0 refills | Status: DC

## 2017-09-09 NOTE — Patient Instructions (Addendum)
You have strep throat and will rx amoxicillin.  For likely flu will rx tamfilu in light of both your kids with flu + test.  For cough will rx benzonatate.  For body aches ibuprofen.  If signs and symptoms worsen or change let us know/return.  Follow up in 7 days or as needed.

## 2017-09-09 NOTE — Progress Notes (Signed)
Subjective:    Patient ID: Walter Nichols, male    DOB: 06/22/1971, 46 y.o.   MRN: [id number]  HPI  Pt son and daughter both have flu. Pt has felt sick for 2 days. Has body aches, chills, sweats, fevers and body aches. Some st as well.     Review of Systems  Constitutional: Positive for chills, diaphoresis, fatigue and fever.  HENT: Positive for sore throat. Negative for congestion, postnasal drip, sinus pressure and sinus pain.   Respiratory: Negative for cough, chest tightness, shortness of breath and wheezing.   Cardiovascular: Negative for chest pain and palpitations.  Gastrointestinal: Negative for abdominal pain.  Musculoskeletal: Negative for back pain.  Skin: Negative for rash.  Neurological: Negative for dizziness and numbness.  Hematological: Negative for adenopathy. Does not bruise/bleed easily.  Psychiatric/Behavioral: Negative for behavioral problems and confusion.   Past Medical History:  Diagnosis Date  . Anxiety   . Depression   . Migraine      Social History   Socioeconomic History  . Marital status: Single    Spouse name: Not on file  . Number of children: Not on file  . Years of education: Not on file  . Highest education level: Not on file  Social Needs  . Financial resource strain: Not on file  . Food insecurity - worry: Not on file  . Food insecurity - inability: Not on file  . Transportation needs - medical: Not on file  . Transportation needs - non-medical: Not on file  Occupational History  . Not on file  Tobacco Use  . Smoking status: Current Every Day Smoker  . Smokeless tobacco: Never Used  Substance and Sexual Activity  . Alcohol use: Yes    Comment: 40 oz a day  . Drug use: No    Comment: in the past used coccaine. 3 yrs ago.  Marland Kitchen. Sexual activity: No  Other Topics Concern  . Not on file  Social History Narrative  . Not on file    Past Surgical History:  Procedure Laterality Date  . HEMORROIDECTOMY      No family history on  file.  No Known Allergies  Current Outpatient Medications on File Prior to Visit  Medication Sig Dispense Refill  . ALPRAZolam (XANAX) 0.5 MG tablet Take 1 tablet (0.5 mg total) by mouth 2 (two) times daily as needed for anxiety. 30 tablet 1  . amLODipine (NORVASC) 10 MG tablet Take 1 tablet (10 mg total) by mouth daily. 30 tablet 3  . baclofen (LIORESAL) 20 MG tablet 1 tab po q hs 30 each 0  . buPROPion (WELLBUTRIN SR) 150 MG 12 hr tablet TAKE 1 TAB BY MOUTH DAILY FOR 3 DAYS THEN 1 TAB TWICE DAILY 63 tablet 1  . cloNIDine (CATAPRES) 0.1 MG tablet Take 1 tablet (0.1 mg total) by mouth 2 (two) times daily. 60 tablet 3  . diclofenac (VOLTAREN) 75 MG EC tablet Take 1 tablet (75 mg total) by mouth 2 (two) times daily. 30 tablet 0  . docusate sodium (COLACE) 100 MG capsule Take 100 mg by mouth 2 (two) times daily.    . Multiple Vitamins-Minerals (MULTIVITAMIN WITH MINERALS) tablet Take 1 tablet by mouth daily.     No current facility-administered medications on file prior to visit.     BP 118/73 (BP Location: Right Arm, Patient Position: Sitting, Cuff Size: Large)   Pulse 81   Temp 98.7 F (37.1 C) (Oral)   Resp 16   Ht 6\' 2"  (  1.88 m)   Wt 277 lb (125.6 kg)   SpO2 98%   BMI 35.56 kg/m       Objective:   Physical Exam  General  Mental Status - Alert. General Appearance - Well groomed. Not in acute distress.  Skin Rashes- No Rashes.  HEENT Head- Normal. Ear Auditory Canal - Left- Normal. Right - Normal.Tympanic Membrane- Left- Normal. Right- Normal. Eye Sclera/Conjunctiva- Left- Normal. Right- Normal. Nose & Sinuses Nasal Mucosa- Left-  Boggy and Congested. Right-  Boggy and  Congested.Bilateral  No maxillary and  No frontal sinus pressure. Mouth & Throat Lips: Upper Lip- Normal: no dryness, cracking, pallor, cyanosis, or vesicular eruption. Lower Lip-Normal: no dryness, cracking, pallor, cyanosis or vesicular eruption. Buccal Mucosa- Bilateral- No Aphthous  ulcers. Oropharynx- No Discharge or Erythema. Tonsils: Characteristics- Bilateral- Erythema . Size/Enlargement- Bilateral- 1+ enlargement. Discharge- bilateral-None.  Neck Neck- Supple. No Masses. Mild enlarge submandibulare nodes.   Chest and Lung Exam Auscultation: Breath Sounds:-Clear even and unlabored.  Cardiovascular Auscultation:Rythm- Regular, rate and rhythm. Murmurs & Other Heart Sounds:Ausculatation of the heart reveal- No Murmurs.  Lymphatic Head & Neck General Head & Neck Lymphatics: Bilateral: Description- No Localized lymphadenopathy.       Assessment & Plan:  You have strep throat and will rx amoxicillin.  For likely flu will rx tamfilu in light of both your kids with flu + test.  For cough will rx benzonatate.  For body aches ibuprofen.  If signs and symptoms worsen or change let us know/return.  Follow up in 7 days or as needed.  Lexy Meininger, Ramon DredgeEdward, PA-C

## 2018-01-27 ENCOUNTER — Encounter: Payer: Self-pay | Admitting: Medical

## 2018-01-27 ENCOUNTER — Ambulatory Visit (INDEPENDENT_AMBULATORY_CARE_PROVIDER_SITE_OTHER): Payer: 59 | Admitting: Medical

## 2018-01-27 VITALS — BP 129/86 | HR 69 | Temp 98.2°F | Resp 16 | Ht 74.0 in | Wt 284.2 lb

## 2018-01-27 DIAGNOSIS — I10 Essential (primary) hypertension: Secondary | ICD-10-CM | POA: Diagnosis not present

## 2018-01-27 DIAGNOSIS — F439 Reaction to severe stress, unspecified: Secondary | ICD-10-CM | POA: Diagnosis not present

## 2018-01-27 DIAGNOSIS — F1911 Other psychoactive substance abuse, in remission: Secondary | ICD-10-CM

## 2018-01-27 DIAGNOSIS — Z87898 Personal history of other specified conditions: Secondary | ICD-10-CM | POA: Diagnosis not present

## 2018-01-27 DIAGNOSIS — F419 Anxiety disorder, unspecified: Secondary | ICD-10-CM | POA: Diagnosis not present

## 2018-01-27 MED ORDER — HYDROXYZINE HCL 25 MG PO TABS: 25.0000 mg | ORAL_TABLET | Freq: Three times a day (TID) | ORAL | 0 refills | Status: DC | PRN

## 2018-01-27 MED ORDER — BUPROPION HCL ER (SR) 150 MG PO TB12: ORAL_TABLET | ORAL | 1 refills | Status: DC

## 2018-01-27 NOTE — Patient Instructions (Addendum)
For smoking cessation, I prescribed wellbutrin. Discussed how to use and taper off smoking.  For stress and anxiety, If you feel that you if it is excessive needing  medication then use  hydroxyzine as directed)  For bp continue with amlodipine.  Follow up in 10 days or as needed  Discussed filling out paperwork for leave. But want to see how he responds to the above treatment first.

## 2018-01-27 NOTE — Progress Notes (Signed)
Subjective:    Patient ID: Walter Nichols, male    DOB: 19-May-1971, 47 y.o.   MRN: [id number]  HPI  Pt in describing some recent stress. States stress related to his lease running out. Also wants to stop smoking. He state he never used wellbutrin.   Some anxiety described but not depressed. Single parent. Feels lonely. Stressed with life in general, being single parent and struggling with slight-modderate urge to drink alchohol.  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  He recently drank [employer] non alcoholic with friends but feels uncomfortable doing. He is still going to [employer]. He is going once a month. He wants to start back with counseling.   Pt wants to start running a mile a day. Get in better shape.  Describes cumalitve stress getting severe that he would benefit from counseling.He wants to be off work for 2 weeks to get centered as he states.He seems to want to take this time to get reestablished back with counseling to make sure does not start drinking alcohol again.   Review of Systems  HENT: Negative for congestion, drooling, ear pain, facial swelling, hearing loss, postnasal drip, rhinorrhea and sinus pain.   Respiratory: Negative for cough, chest tightness, shortness of breath and wheezing.   Cardiovascular: Negative for chest pain and palpitations.  Gastrointestinal: Negative for abdominal distention, abdominal pain, blood in stool, constipation, rectal pain and vomiting.  Musculoskeletal: Negative for back pain.  Skin: Negative for rash.  Neurological: Negative for dizziness, seizures, weakness and light-headedness.  Psychiatric/Behavioral: Negative for agitation and behavioral problems. The patient is nervous/anxious.        Some recent stress.     Past Medical History:  Diagnosis Date  . Anxiety   . Depression   . Migraine      Social History   Socioeconomic History  . Marital status: Single    Spouse name: Not on file  . Number of children: Not on file  .  Years of education: Not on file  . Highest education level: Not on file  Occupational History  . Not on file  Social Needs  . Financial resource strain: Not on file  . Food insecurity:    Worry: Not on file    Inability: Not on file  . Transportation needs:    Medical: Not on file    Non-medical: Not on file  Tobacco Use  . Smoking status: Current Every Day Smoker  . Smokeless tobacco: Never Used  Substance and Sexual Activity  . Alcohol use: Yes    Comment: 40 oz a day  . Drug use: No    Comment: in the past used coccaine. 3 yrs ago.  Marland Kitchen Sexual activity: Never  Lifestyle  . Physical activity:    Days per week: Not on file    Minutes per session: Not on file  . Stress: Not on file  Relationships  . Social connections:    Talks on phone: Not on file    Gets together: Not on file    Attends religious service: Not on file    Active member of club or organization: Not on file    Attends meetings of clubs or organizations: Not on file    Relationship status: Not on file  . Intimate partner violence:    Fear of current or ex partner: Not on file    Emotionally abused: Not on file    Physically abused: Not on file    Forced sexual activity: Not on  file  Other Topics Concern  . Not on file  Social History Narrative  . Not on file    Past Surgical History:  Procedure Laterality Date  . HEMORROIDECTOMY      No family history on file.  No Known Allergies  Current Outpatient Medications on File Prior to Visit  Medication Sig Dispense Refill  . Multiple Vitamins-Minerals (MULTIVITAMIN WITH MINERALS) tablet Take 1 tablet by mouth daily.    Marland Kitchen ALPRAZolam (XANAX) 0.5 MG tablet Take 1 tablet (0.5 mg total) by mouth 2 (two) times daily as needed for anxiety. (Patient not taking: Reported on 01/27/2018) 30 tablet 1  . amLODipine (NORVASC) 10 MG tablet Take 1 tablet (10 mg total) by mouth daily. (Patient not taking: Reported on 01/27/2018) 30 tablet 3  . buPROPion (WELLBUTRIN SR)  150 MG 12 hr tablet TAKE 1 TAB BY MOUTH DAILY FOR 3 DAYS THEN 1 TAB TWICE DAILY (Patient not taking: Reported on 01/27/2018) 63 tablet 1  . cloNIDine (CATAPRES) 0.1 MG tablet Take 1 tablet (0.1 mg total) by mouth 2 (two) times daily. (Patient not taking: Reported on 01/27/2018) 60 tablet 3   No current facility-administered medications on file prior to visit.     BP 129/86   Pulse 69   Temp 98.2 F (36.8 C) (Oral)   Resp 16   Ht  (1.88 m)   Wt 284 lb 3.2 oz (128.9 kg)   SpO2 99%   BMI 36.49 kg/m       Objective:   Physical Exam  General Mental Status- Alert. General Appearance- Not in acute distress.   Skin General: Color- Normal Color. Moisture- Normal Moisture.  Neck Carotid Arteries- Normal color. Moisture- Normal Moisture. No carotid bruits. No JVD.  Chest and Lung Exam Auscultation: Breath Sounds:-Normal.  Cardiovascular Auscultation:Rythm- Regular. Murmurs & Other Heart Sounds:Auscultation of the heart reveals- No Murmurs.  Abdomen Inspection:-Inspeection Normal. Palpation/Percussion:Note:No mass. Palpation and Percussion of the abdomen reveal- Non Tender, Non Distended + BS, no rebound or guarding.  Neurologic Cranial Nerve exam:- CN III-XII intact(No nystagmus), symmetric smile. Strength:- 5/5 equal and symmetric strength both upper and lower extremities.      Assessment & Plan:  For smoking cessation, I prescribed wellbutrin. Discussed how to use and taper off smoking.  For stress and anxiety, If you feel that you if it is excessive needing medication then use  hydroxyzine as directed)  For bp continue with amlodipine.  Follow up in 10 days  or as needed  [employer], [employer]

## 2018-02-07 ENCOUNTER — Telehealth: Payer: Self-pay | Admitting: Medical

## 2018-02-07 NOTE — Telephone Encounter (Signed)
Copied from CRM [id number]. Topic: Quick Communication - See Telephone Encounter >> Feb 07, 2018 12:59 PM Debroah Loop wrote: See Telephone Encounter from 02/07/2018: Maribel calling from [facility] about short term disability documentation. Please send office notes, treatment plan, referral info if any, and a return to work date and attach the patient's Claim#[id number].

## 2018-02-08 ENCOUNTER — Telehealth: Payer: Self-pay | Admitting: Medical

## 2018-02-08 ENCOUNTER — Ambulatory Visit: Payer: 59 | Admitting: Medical

## 2018-02-08 NOTE — Telephone Encounter (Signed)
Copied from CRM [id number]. Topic: Quick Communication - Patient Running Late >> Feb 08, 2018  9:05 AM Rudi Coco, NT wrote: Patient called and is running 10 minutes late.  Route to department's PEC pool.

## 2018-02-09 ENCOUNTER — Encounter: Payer: Self-pay | Admitting: Medical

## 2018-02-09 ENCOUNTER — Ambulatory Visit (INDEPENDENT_AMBULATORY_CARE_PROVIDER_SITE_OTHER): Payer: 59 | Admitting: Medical

## 2018-02-09 VITALS — BP 138/82 | HR 78 | Temp 98.2°F | Resp 16 | Ht 74.0 in | Wt 281.0 lb

## 2018-02-09 DIAGNOSIS — F419 Anxiety disorder, unspecified: Secondary | ICD-10-CM

## 2018-02-09 DIAGNOSIS — F1911 Other psychoactive substance abuse, in remission: Secondary | ICD-10-CM

## 2018-02-09 DIAGNOSIS — Z87898 Personal history of other specified conditions: Secondary | ICD-10-CM | POA: Diagnosis not present

## 2018-02-09 DIAGNOSIS — F439 Reaction to severe stress, unspecified: Secondary | ICD-10-CM | POA: Diagnosis not present

## 2018-02-09 DIAGNOSIS — F17200 Nicotine dependence, unspecified, uncomplicated: Secondary | ICD-10-CM

## 2018-02-09 MED ORDER — HYDROXYZINE HCL 25 MG PO TABS: 25.0000 mg | ORAL_TABLET | Freq: Three times a day (TID) | ORAL | 0 refills | Status: DC | PRN

## 2018-02-09 NOTE — Progress Notes (Signed)
Subjective:    Patient ID: Walter Nichols, male    DOB: [birth year], 47 y.o.   MRN: [id number]  HPI  Pt is in for follow up.  He wants to stop smoking and has been recently moderate stress/anxiety  related to maintaining his sobriety.(former alcohol and drug abuse)  Some recently family stress at get together at sister house over [place] day weekend.   Pt still maintaining his sobriety and no illegal drug use. Pt did go to NA meeting/homework 3 times last week. This week has gone twice.   He did fill hydroxyzine for his anxiety. He feels like this helped.  He has not filled the Wellbutrin to help with smoking cessation.  Pt states he has been running about a mile a day for past 2 weeks. Also going to gym 5 days a week.  Pt still feels like taking a break for work. In fact he already put in for leave May 17,2019 until June 17,2019.    Pt did drink [place] non alcoholic again 6 pack. Other family members were drinking alchohol.  Review of Systems  Constitutional: Negative for chills, fatigue and fever.  HENT: Negative for congestion and drooling.   Respiratory: Negative for cough, chest tightness, shortness of breath and wheezing.   Cardiovascular: Negative for chest pain and palpitations.  Gastrointestinal: Negative for abdominal pain, blood in stool, constipation and vomiting.  Musculoskeletal: Negative for back pain and gait problem.  Skin: Negative for rash.  Neurological: Negative for dizziness, seizures, weakness and light-headedness.  Hematological: Negative for adenopathy. Does not bruise/bleed easily.  Psychiatric/Behavioral: Positive for dysphoric mood. Negative for agitation, confusion, decreased concentration and suicidal ideas. The patient is nervous/anxious.        States he is stressed out.   Past Medical History:  Diagnosis Date  . Anxiety   . Depression   . Migraine      Social History   Socioeconomic History  . Marital status: Single    Spouse name: Not  on file  . Number of children: Not on file  . Years of education: Not on file  . Highest education level: Not on file  Occupational History  . Not on file  Social Needs  . Financial resource strain: Not on file  . Food insecurity:    Worry: Not on file    Inability: Not on file  . Transportation needs:    Medical: Not on file    Non-medical: Not on file  Tobacco Use  . Smoking status: Current Every Day Smoker  . Smokeless tobacco: Never Used  Substance and Sexual Activity  . Alcohol use: Yes    Comment: 40 oz a day  . Drug use: No    Comment: in the past used coccaine. 3 yrs ago.  Marland Kitchen Sexual activity: Never  Lifestyle  . Physical activity:    Days per week: Not on file    Minutes per session: Not on file  . Stress: Not on file  Relationships  . Social connections:    Talks on phone: Not on file    Gets together: Not on file    Attends religious service: Not on file    Active member of club or organization: Not on file    Attends meetings of clubs or organizations: Not on file    Relationship status: Not on file  . Intimate partner violence:    Fear of current or ex partner: Not on file    Emotionally abused: Not on file  Physically abused: Not on file    Forced sexual activity: Not on file  Other Topics Concern  . Not on file  Social History Narrative  . Not on file    Past Surgical History:  Procedure Laterality Date  . HEMORROIDECTOMY      No family history on file.  No Known Allergies  Current Outpatient Medications on File Prior to Visit  Medication Sig Dispense Refill  . amLODipine (NORVASC) 10 MG tablet Take 1 tablet (10 mg total) by mouth daily. 30 tablet 3  . buPROPion (WELLBUTRIN SR) 150 MG 12 hr tablet TAKE 1 TAB BY MOUTH DAILY FOR 3 DAYS THEN 1 TAB TWICE DAILY 63 tablet 1  . cloNIDine (CATAPRES) 0.1 MG tablet Take 1 tablet (0.1 mg total) by mouth 2 (two) times daily. 60 tablet 3  . hydrOXYzine (ATARAX/VISTARIL) 25 MG tablet Take 1 tablet (25 mg  total) by mouth 3 (three) times daily as needed for anxiety. 30 tablet 0  . Multiple Vitamins-Minerals (MULTIVITAMIN WITH MINERALS) tablet Take 1 tablet by mouth daily.     No current facility-administered medications on file prior to visit.     BP 138/82   Pulse 78   Temp 98.2 F (36.8 C) (Oral)   Resp 16   Ht  (1.88 m)   Wt 281 lb (127.5 kg)   SpO2 98%   BMI 36.08 kg/m       Objective:   Physical Exam  General Mental Status- Alert. General Appearance- Not in acute distress.   Skin General: Color- Normal Color. Moisture- Normal Moisture.  Neck Carotid Arteries- Normal color. Moisture- Normal Moisture. No carotid bruits. No JVD.  Chest and Lung Exam Auscultation: Breath Sounds:-Normal.  Cardiovascular Auscultation:Rythm- Regular. Murmurs & Other Heart Sounds:Auscultation of the heart reveals- No Murmurs.  Abdomen Inspection:-Inspeection Normal. Palpation/Percussion:Note:No mass. Palpation and Percussion of the abdomen reveal- Non Tender, Non Distended + BS, no rebound or guarding.  Neurologic Cranial Nerve exam:- CN III-XII intact(No nystagmus), symmetric smile. Strength:- 5/5 equal and symmetric strength both upper and lower extremities.          Assessment & Plan:  For your smoking cessation and mood, I do want you to fill the Wellbutrin prescription.  For the stress and anxiety related to life, work and maintaining sobriety, I did send a refill of your hydroxyzine.  I did go ahead and fill out the [facility] paperwork.  I am giving the paperwork to Jasmine December so she can go ahead and fax it in.  Please attend your NA/AA meetings and restart with counselor.  Follow-up in 1 month or as needed  [employer], [employer]

## 2018-02-09 NOTE — Patient Instructions (Signed)
For your smoking cessation and mood, I do want you to fill the Wellbutrin prescription.  For the stress and anxiety related to life, work and maintaining sobriety, I did send a refill of your hydroxyzine.  I did go ahead and fill out the [facility] paperwork.  I am giving the paperwork to Jasmine December so she can go ahead and fax it in.  Please attend your NA/AA meetings and restart with counselor.  Follow-up in 1 month or as needed

## 2018-02-10 ENCOUNTER — Encounter: Payer: Self-pay | Admitting: Medical

## 2018-02-13 ENCOUNTER — Telehealth: Payer: Self-pay | Admitting: Medical

## 2018-02-13 NOTE — Telephone Encounter (Signed)
Copied from CRM [phone]. Topic: Quick Communication - Rx Refill/Question >> Feb 13, 2018  2:57 PM Cipriano BunkerLambe, Annette S wrote: Medication: BuPROPion (WELLBUTRIN SR) 150 MG 12 hr tablet 63 tablet 1 01/27/2018   Sig: TAKE 1 TAB BY MOUTH DAILY FOR 3 DAYS THEN 1 TAB TWICE DAILY  Sent to pharmacy as: buPROPion (WELLBUTRIN SR) 150 MG 12 hr tablet  Notes to Pharmacy: On refill sig twice daily. Quantity 60 tabs.   Pt. Said [facility] told [facility] that it would have to be a 90 day supply for insurance to pay    Has the patient contacted their pharmacy? Yes.   (Agent: If no, request that the patient contact the pharmacy for the refill.) (Agent: If yes, when and what did the pharmacy advise?)  Preferred Pharmacy (with phone number or street name): [id number] [place], [place] - [address] [place] [id number] Phone: [phone] Fax: [phone]    Agent: Please be advised that RX refills may take up to 3 business days. We ask that you follow-up with your pharmacy.

## 2018-02-15 NOTE — Telephone Encounter (Signed)
Patient requesting a 90 day supply of medication for insurance purposes. Please correct # given to correct if OK with provider.

## 2018-02-16 MED ORDER — BUPROPION HCL ER (SR) 150 MG PO TB12: 150.0000 mg | ORAL_TABLET | Freq: Two times a day (BID) | ORAL | 0 refills | Status: DC

## 2018-02-16 NOTE — Telephone Encounter (Signed)
Sent in 90 day supply of wellbutrin.

## 2018-02-17 ENCOUNTER — Telehealth: Payer: Self-pay

## 2018-02-17 NOTE — Telephone Encounter (Signed)
Copied from CRM #[id number]. Topic: Quick Communication - See Telephone Encounter >> Feb 07, 2018 12:59 PM Lander, Lumin L wrote: See Telephone Encounter from 02/07/2018: Maribel calling from [employer] about short term disability documentation. Please send office notes, treatment plan, referral info if any, and a return to work date and attach the patient's Claim#[id number].  >> Feb 16, 2018  5:26 PM Walter, Linda F wrote: The patient is requesting that the paperwork for his short term disability be faxed again to [employer]. 

## 2018-02-20 ENCOUNTER — Telehealth: Payer: Self-pay | Admitting: Medical

## 2018-02-20 NOTE — Telephone Encounter (Signed)
Received paperwork that is not FMLA/STD paperwork, it is Behavioral Health Functional Assessment [facility]; forwarded to provider/SLS 06/10

## 2018-02-20 NOTE — Telephone Encounter (Signed)
Pt has [employer] form that is required to be filled out by board certified specialist. This is behavioral health form and needs to be filled out by psychologist, counselor or psychiatrist.   I tried to call him and could not leave a voice mail. No box available.  Will  You call and explain above to him.  So he needs to reach out and get scheduled to see who he saw in the past or he needs to get re-established.   I already filled out standard paperwork.  But he needs to be aware that   Form states failure to complete the form may result in the delay or denial of patient benefits. So we need to work on him seeing behavioral health asap to fill this form out.

## 2018-03-01 ENCOUNTER — Telehealth: Payer: Self-pay | Admitting: Medical

## 2018-03-01 NOTE — Telephone Encounter (Signed)
I tried to call patient today in order to inform him that his [employer] form needs to be filled out by board certified specialist.  I had try to call him when originally got this form but he does not have voicemail set up.  I did not see any alternate numbers.  So I am calling again today.  I recently filled out some FMLA type forms for him.  But [employer] requires behavioral health to fill out the forms.  Since I cannot contact him by phone would you type a letter and explained the above.  I can sign that or you can use my staff.  I will leave the form upfront in the alphabetical file for him to pick up.  I will not be in the office all next week so I would like a letter to be sent out by this Friday.

## 2018-03-01 NOTE — Telephone Encounter (Signed)
If pt does call back. Recommend that he get get counselor, psychiatrist or psychologist to fill out form. He has history of substance abuse. [place] of state and when he came back believe he saw outpatient specialist as well.

## 2018-03-03 NOTE — Telephone Encounter (Signed)
Author phoned pt, and was able to reach him. Pt. Stated he is already back to work, and things are going well. Pt. Thought the FMLA paperwork had already been processed. Pt. Made aware of need for specialist to fill out forms, and pt. Said he would pick up the forms this afternoon at the office, and follow-up with his already established counselor. Routed to [employer] Saguier, [place].

## 2018-03-13 ENCOUNTER — Encounter: Payer: Self-pay | Admitting: Medical

## 2018-03-13 ENCOUNTER — Ambulatory Visit (INDEPENDENT_AMBULATORY_CARE_PROVIDER_SITE_OTHER): Payer: 59 | Admitting: Medical

## 2018-03-13 VITALS — BP 140/88 | HR 62 | Temp 98.2°F | Resp 16 | Ht 74.0 in | Wt 284.0 lb

## 2018-03-13 DIAGNOSIS — F419 Anxiety disorder, unspecified: Secondary | ICD-10-CM

## 2018-03-13 DIAGNOSIS — F439 Reaction to severe stress, unspecified: Secondary | ICD-10-CM | POA: Diagnosis not present

## 2018-03-13 DIAGNOSIS — Z87898 Personal history of other specified conditions: Secondary | ICD-10-CM | POA: Diagnosis not present

## 2018-03-13 DIAGNOSIS — I10 Essential (primary) hypertension: Secondary | ICD-10-CM | POA: Diagnosis not present

## 2018-03-13 DIAGNOSIS — F1911 Other psychoactive substance abuse, in remission: Secondary | ICD-10-CM

## 2018-03-13 DIAGNOSIS — F17200 Nicotine dependence, unspecified, uncomplicated: Secondary | ICD-10-CM

## 2018-03-13 MED ORDER — AMLODIPINE BESYLATE 10 MG PO TABS: 10.0000 mg | ORAL_TABLET | Freq: Every day | ORAL | 11 refills | Status: DC

## 2018-03-13 MED ORDER — HYDROXYZINE HCL 25 MG PO TABS: 25.0000 mg | ORAL_TABLET | Freq: Three times a day (TID) | ORAL | 3 refills | Status: DC | PRN

## 2018-03-13 NOTE — Progress Notes (Signed)
Subjective:    Patient ID: Walter Nichols, male    DOB: 01/21/1971, 47 y.o.   MRN: [id number]  HPI   Pt in for follow up.  Pt just picked up his specialist form but he already returned to work.  He has seen counselor/therapist and he is doing well.   He has already returned to work. He is maintaining his sobriety. Not using any alcohol or illegal substances.  Still maintaining NA/AA meetings.  Still expressing stress about purchase of home. Closing is coming up.  Pt states hydroxyzine is helping with his anxiety.  He is trying to quit smoking. Do far has not quit smoking.  Pt has been working out. He feels that is it helping handle stress.   Review of Systems  Constitutional: Negative for chills, fatigue and fever.  Respiratory: Negative for chest tightness, shortness of breath and wheezing.   Cardiovascular: Negative for chest pain and palpitations.  Gastrointestinal: Negative for abdominal pain, blood in stool, diarrhea, rectal pain and vomiting.  Musculoskeletal: Negative for back pain and myalgias.  Skin: Negative for rash.  Neurological: Negative for dizziness, speech difficulty, weakness, numbness and headaches.  Hematological: Negative for adenopathy. Does not bruise/bleed easily.  Psychiatric/Behavioral: Positive for dysphoric mood. Negative for behavioral problems, self-injury, sleep disturbance and suicidal ideas. The patient is nervous/anxious.        Mood has improved.    Past Medical History:  Diagnosis Date  . Anxiety   . Depression   . Migraine      Social History   Socioeconomic History  . Marital status: Single    Spouse name: Not on file  . Number of children: Not on file  . Years of education: Not on file  . Highest education level: Not on file  Occupational History  . Not on file  Social Needs  . Financial resource strain: Not on file  . Food insecurity:    Worry: Not on file    Inability: Not on file  . Transportation needs:    Medical:  Not on file    Non-medical: Not on file  Tobacco Use  . Smoking status: Current Every Day Smoker  . Smokeless tobacco: Never Used  Substance and Sexual Activity  . Alcohol use: Yes    Comment: 40 oz a day  . Drug use: No    Comment: in the past used coccaine. 3 yrs ago.  Marland Kitchen. Sexual activity: Never  Lifestyle  . Physical activity:    Days per week: Not on file    Minutes per session: Not on file  . Stress: Not on file  Relationships  . Social connections:    Talks on phone: Not on file    Gets together: Not on file    Attends religious service: Not on file    Active member of club or organization: Not on file    Attends meetings of clubs or organizations: Not on file    Relationship status: Not on file  . Intimate partner violence:    Fear of current or ex partner: Not on file    Emotionally abused: Not on file    Physically abused: Not on file    Forced sexual activity: Not on file  Other Topics Concern  . Not on file  Social History Narrative  . Not on file    Past Surgical History:  Procedure Laterality Date  . HEMORROIDECTOMY      No family history on file.  No Known Allergies  Current Outpatient Medications on File Prior to Visit  Medication Sig Dispense Refill  . amLODipine (NORVASC) 10 MG tablet Take 1 tablet (10 mg total) by mouth daily. 30 tablet 3  . buPROPion (WELLBUTRIN SR) 150 MG 12 hr tablet Take 1 tablet (150 mg total) by mouth 2 (two) times daily. 180 tablet 0  . cloNIDine (CATAPRES) 0.1 MG tablet Take 1 tablet (0.1 mg total) by mouth 2 (two) times daily. 60 tablet 3  . hydrOXYzine (ATARAX/VISTARIL) 25 MG tablet Take 1 tablet (25 mg total) by mouth 3 (three) times daily as needed for anxiety. 30 tablet 0  . Multiple Vitamins-Minerals (MULTIVITAMIN WITH MINERALS) tablet Take 1 tablet by mouth daily.     No current facility-administered medications on file prior to visit.     BP (!) 154/99   Pulse 62   Temp 98.2 F (36.8 C) (Oral)   Resp 16   Ht  6\' 2"  (1.88 m)   Wt 284 lb (128.8 kg)   SpO2 100%   BMI 36.46 kg/m       Objective:   Physical Exam  General Mental Status- Alert. General Appearance- Not in acute distress.   Skin General: Color- Normal Color. Moisture- Normal Moisture.  Neck Carotid Arteries- Normal color. Moisture- Normal Moisture. No carotid bruits. No JVD.  Chest and Lung Exam Auscultation: Breath Sounds:-Normal.  Cardiovascular Auscultation:Rythm- Regular. Murmurs & Other Heart Sounds:Auscultation of the heart reveals- No Murmurs.  Abdomen Inspection:-Inspeection Normal. Palpation/Percussion:Note:No mass. Palpation and Percussion of the abdomen reveal- Non Tender, Non Distended + BS, no rebound or guarding.   Neurologic Cranial Nerve exam:- CN III-XII intact(No nystagmus), symmetric smile. Strength:- 5/5 equal and symmetric strength both upper and lower extremities.      Assessment & Plan:  For stress and anxiety, I recommend you continue hydroxyzine.  For smoking cessation continue wellbutrin.  For substance abuse history, continue with counselor and NA/AA meetings.  For htn continue your blood pressure medications.  Continue to work out regularly. Recommend higher repetitions and try to lose 10-15 pounds  Follow up in 2-3 months or as needed  [employer], [employer]

## 2018-03-13 NOTE — Patient Instructions (Addendum)
For stress and anxiety, I recommend you continue hydroxyzine.  For smoking cessation continue wellbutrin.  For substance abuse history, continue with counselor and NA/AA meetings.  For htn continue your blood pressure medications.  Continue to work out regularly. Recommend higher repetitions and try to lose 10-15 pounds  Follow up in 2-3 months or as needed

## 2018-05-19 ENCOUNTER — Other Ambulatory Visit: Payer: Self-pay | Admitting: Medical

## 2018-06-13 ENCOUNTER — Telehealth: Payer: Self-pay | Admitting: Medical

## 2018-06-13 ENCOUNTER — Ambulatory Visit (INDEPENDENT_AMBULATORY_CARE_PROVIDER_SITE_OTHER): Payer: 59 | Admitting: Medical

## 2018-06-13 ENCOUNTER — Encounter: Payer: Self-pay | Admitting: Medical

## 2018-06-13 VITALS — BP 131/75 | HR 70 | Temp 97.9°F | Ht 74.0 in | Wt 275.6 lb

## 2018-06-13 DIAGNOSIS — R195 Other fecal abnormalities: Secondary | ICD-10-CM

## 2018-06-13 DIAGNOSIS — F17200 Nicotine dependence, unspecified, uncomplicated: Secondary | ICD-10-CM | POA: Diagnosis not present

## 2018-06-13 DIAGNOSIS — I10 Essential (primary) hypertension: Secondary | ICD-10-CM

## 2018-06-13 DIAGNOSIS — F1911 Other psychoactive substance abuse, in remission: Secondary | ICD-10-CM

## 2018-06-13 DIAGNOSIS — Z23 Encounter for immunization: Secondary | ICD-10-CM | POA: Diagnosis not present

## 2018-06-13 LAB — COMPREHENSIVE METABOLIC PANEL
ALBUMIN: 4.2 g/dL (ref 3.5–5.2)
ALK PHOS: 122 U/L — AB (ref 39–117)
ALT: 41 U/L (ref 0–53)
AST: 41 U/L — ABNORMAL HIGH (ref 0–37)
BUN: 12 mg/dL (ref 6–23)
CO2: 26 mEq/L (ref 19–32)
Calcium: 9.7 mg/dL (ref 8.4–10.5)
Chloride: 106 mEq/L (ref 96–112)
Creatinine, Ser: 1.31 mg/dL (ref 0.40–1.50)
GFR: 75.79 mL/min (ref >60.00)
Glucose, Bld: 102 mg/dL — ABNORMAL HIGH (ref 70–99)
POTASSIUM: 4.3 meq/L (ref 3.5–5.1)
SODIUM: 139 meq/L (ref 135–145)
TOTAL PROTEIN: 6.9 g/dL (ref 6.0–8.3)
Total Bilirubin: 0.3 mg/dL (ref 0.2–1.2)

## 2018-06-13 LAB — LIPID PANEL
CHOLESTEROL: 208 mg/dL — AB (ref 0–200)
HDL: 26.4 mg/dL — ABNORMAL LOW (ref >39.00)
NonHDL: 181.12
Total CHOL/HDL Ratio: 8
Triglycerides: 400 mg/dL — ABNORMAL HIGH (ref 0.0–149.0)
VLDL: 80 mg/dL — AB (ref 0.0–40.0)

## 2018-06-13 LAB — LDL CHOLESTEROL, DIRECT: LDL DIRECT: 123 mg/dL

## 2018-06-13 MED ORDER — AMLODIPINE BESYLATE 10 MG PO TABS: 10.0000 mg | ORAL_TABLET | Freq: Every day | ORAL | 11 refills | Status: DC

## 2018-06-13 MED ORDER — FENOFIBRATE 48 MG PO TABS: 48.0000 mg | ORAL_TABLET | Freq: Every day | ORAL | 3 refills | Status: DC

## 2018-06-13 NOTE — Progress Notes (Signed)
Subjective:    Patient ID: Walter Nichols, male    DOB: [birth year], 47 y.o.   MRN: [id number]  HPI  Pt in for follow up on his blood pressure. His bp is controlled today. No cardiac or neurologic signs or symptoms. Pt states not using clonidine but his bp is still controlled. On amlodipine.  Pt state some mild loose stools for last 3 days. Mild stomach cramps. Kids were sick before him. No nausea or vomiting. He states gradually improving.  Pt states he is still smoking despite using wellbutrin. He state extra stress related to buying house barrier to his quitting.  Pt updates be still maintaining his sobriety. He is working out a lot.    Review of Systems  Constitutional: Negative for chills, fatigue and fever.  Respiratory: Negative for chest tightness, shortness of breath and wheezing.   Cardiovascular: Negative for chest pain and palpitations.  Gastrointestinal: Positive for diarrhea. Negative for abdominal pain, nausea and vomiting.  Genitourinary: Negative for dysuria and frequency.  Musculoskeletal: Negative for back pain.  Skin: Negative for rash.  Neurological: Negative for dizziness.  Hematological: Negative for adenopathy. Does not bruise/bleed easily.  Psychiatric/Behavioral: Negative for behavioral problems and confusion. The patient is not nervous/anxious.     Past Medical History:  Diagnosis Date  . Anxiety   . Depression   . Migraine      Social History   Socioeconomic History  . Marital status: Single    Spouse name: Not on file  . Number of children: Not on file  . Years of education: Not on file  . Highest education level: Not on file  Occupational History  . Not on file  Social Needs  . Financial resource strain: Not on file  . Food insecurity:    Worry: Not on file    Inability: Not on file  . Transportation needs:    Medical: Not on file    Non-medical: Not on file  Tobacco Use  . Smoking status: Current Every Day Smoker  . Smokeless  tobacco: Never Used  Substance and Sexual Activity  . Alcohol use: Yes    Comment: 40 oz a day  . Drug use: No    Comment: in the past used coccaine. 3 yrs ago.  Marland Kitchen Sexual activity: Never  Lifestyle  . Physical activity:    Days per week: Not on file    Minutes per session: Not on file  . Stress: Not on file  Relationships  . Social connections:    Talks on phone: Not on file    Gets together: Not on file    Attends religious service: Not on file    Active member of club or organization: Not on file    Attends meetings of clubs or organizations: Not on file    Relationship status: Not on file  . Intimate partner violence:    Fear of current or ex partner: Not on file    Emotionally abused: Not on file    Physically abused: Not on file    Forced sexual activity: Not on file  Other Topics Concern  . Not on file  Social History Narrative  . Not on file    Past Surgical History:  Procedure Laterality Date  . HEMORROIDECTOMY      No family history on file.  No Known Allergies  Current Outpatient Medications on File Prior to Visit  Medication Sig Dispense Refill  . amLODipine (NORVASC) 10 MG tablet Take 1 tablet (10  mg total) by mouth daily. 30 tablet 11  . hydrOXYzine (ATARAX/VISTARIL) 25 MG tablet Take 1 tablet (25 mg total) by mouth 3 (three) times daily as needed for anxiety. 30 tablet 3  . Multiple Vitamins-Minerals (MULTIVITAMIN WITH MINERALS) tablet Take 1 tablet by mouth daily.    Marland Kitchen buPROPion (WELLBUTRIN SR) 150 MG 12 hr tablet TAKE 1 TABLET BY MOUTH TWICE A DAY (Patient not taking: Reported on 06/13/2018) 180 tablet 0  . cloNIDine (CATAPRES) 0.1 MG tablet Take 1 tablet (0.1 mg total) by mouth 2 (two) times daily. (Patient not taking: Reported on 06/13/2018) 60 tablet 3   No current facility-administered medications on file prior to visit.     BP 131/75 (BP Location: Left Arm, Patient Position: Sitting, Cuff Size: Large)   Pulse 70   Temp 97.9 F (36.6 C) (Oral)    Ht 6\' 2"  (1.88 m)   Wt 275 lb 9.6 oz (125 kg)   SpO2 99%   BMI 35.38 kg/m       Objective:   Physical Exam  General Mental Status- Alert. General Appearance- Not in acute distress.   Skin General: Color- Normal Color. Moisture- Normal Moisture.  Neck Carotid Arteries- Normal color. Moisture- Normal Moisture. No carotid bruits. No JVD.  Chest and Lung Exam Auscultation: Breath Sounds:-Normal.  Cardiovascular Auscultation:Rythm- Regular. Murmurs & Other Heart Sounds:Auscultation of the heart reveals- No Murmurs.  Abdomen Inspection:-Inspeection Normal. Palpation/Percussion:Note:No mass. Palpation and Percussion of the abdomen reveal- Non Tender, Non Distended + BS, no rebound or guarding.   Neurologic Cranial Nerve exam:- CN III-XII intact(No nystagmus), symmetric smile. Strength:- 5/5 equal and symmetric strength both upper and lower extremities.     Assessment & Plan:  Your blood pressure is well controlled. Continue current medication regimen.  For smoking cessation continue wellbutrin. Advised continue and taper off cigarettes.  For loose stools recommend bland diet and use immodium otc. If viral I thinks will taper off in about 3 days.  Good job on maintaining sobriety.  Will get cmp and lipid panel today.  Follow up date to be determined after review  Esperanza Richters, PA-C

## 2018-06-13 NOTE — Patient Instructions (Addendum)
Your blood pressure is well controlled. Continue current medication regimen.  For smoking cessation continue wellbutrin. Advised continue and taper off cigarettes.  For loose stools recommend bland diet and use immodium otc. If viral I thinks will taper off in about 3 days.  Good job on maintaining sobriety.  Will get cmp and lipid panel today.  Follow up date to be determined after review

## 2018-06-13 NOTE — Telephone Encounter (Signed)
Rx fenofibrate sent to pt pharmacy. 

## 2018-08-24 ENCOUNTER — Other Ambulatory Visit: Payer: Self-pay | Admitting: Medical

## 2018-09-04 ENCOUNTER — Other Ambulatory Visit: Payer: Self-pay | Admitting: Medical

## 2018-09-15 ENCOUNTER — Encounter: Payer: Self-pay | Admitting: Medical

## 2018-09-15 ENCOUNTER — Other Ambulatory Visit: Payer: 59

## 2018-09-15 ENCOUNTER — Ambulatory Visit (INDEPENDENT_AMBULATORY_CARE_PROVIDER_SITE_OTHER): Payer: 59 | Admitting: Medical

## 2018-09-15 VITALS — BP 135/80 | HR 61 | Temp 98.2°F | Resp 16 | Ht 76.0 in | Wt 280.6 lb

## 2018-09-15 DIAGNOSIS — R739 Hyperglycemia, unspecified: Secondary | ICD-10-CM

## 2018-09-15 DIAGNOSIS — I10 Essential (primary) hypertension: Secondary | ICD-10-CM | POA: Diagnosis not present

## 2018-09-15 DIAGNOSIS — E785 Hyperlipidemia, unspecified: Secondary | ICD-10-CM | POA: Diagnosis not present

## 2018-09-15 DIAGNOSIS — F17200 Nicotine dependence, unspecified, uncomplicated: Secondary | ICD-10-CM | POA: Diagnosis not present

## 2018-09-15 LAB — COMPREHENSIVE METABOLIC PANEL
ALBUMIN: 4.3 g/dL (ref 3.5–5.2)
ALT: 27 U/L (ref 0–53)
AST: 31 U/L (ref 0–37)
Alkaline Phosphatase: 112 U/L (ref 39–117)
BUN: 11 mg/dL (ref 6–23)
CALCIUM: 9.7 mg/dL (ref 8.4–10.5)
CHLORIDE: 108 meq/L (ref 96–112)
CO2: 24 mEq/L (ref 19–32)
CREATININE: 1.34 mg/dL (ref 0.40–1.50)
GFR: 73.75 mL/min (ref >60.00)
Glucose, Bld: 96 mg/dL (ref 70–99)
POTASSIUM: 4.4 meq/L (ref 3.5–5.1)
Sodium: 140 mEq/L (ref 135–145)
Total Bilirubin: 0.4 mg/dL (ref 0.2–1.2)
Total Protein: 6.9 g/dL (ref 6.0–8.3)

## 2018-09-15 LAB — LIPID PANEL
CHOLESTEROL: 190 mg/dL (ref 0–200)
HDL: 27.7 mg/dL — AB (ref >39.00)
LDL CALC: 129 mg/dL — AB (ref 0–99)
NonHDL: 162.62
TRIGLYCERIDES: 166 mg/dL — AB (ref 0.0–149.0)
Total CHOL/HDL Ratio: 7
VLDL: 33.2 mg/dL (ref 0.0–40.0)

## 2018-09-15 NOTE — Patient Instructions (Addendum)
Your bp is well controlled. Continue current medications.  For smoking, continue wellbutrin and try to stop smoking.  For high cholesterol, will get lipid panel check today.  For elevated sugar mild in past will get a1c today.  I need [employer]ml[employer] forms and also need to review your prior forms. Then make decision on filling out.  Follow up date to be determined after lab review.

## 2018-09-15 NOTE — Progress Notes (Signed)
Subjective:    Patient ID: Walter Nichols, male    DOB: 03/11/1971, 48 y.o.   MRN: [id number]  HPI  Pt in for follow up.  BP is well controlled. No cardiac or neurologic signs or symptoms.  Pt is on fenofibrate medication. High cholesterol.   Pt is still smoking. I prescribed him wellbutrin but he continues to smoke. Pt declines nicotine patches.   Pt has some old fmla paperwork. He states his past fmla ran out. This is related to potential child issues. Son prior suicidal ideations. Also his daughter had some issues in past. She had voiced suicidal ideations. Family issues are stable. But dad is only single parent.    Review of Systems  Constitutional: Negative for chills, fatigue and fever.  HENT: Negative for congestion, drooling and ear pain.   Respiratory: Negative for cough, chest tightness, shortness of breath and wheezing.   Cardiovascular: Negative for chest pain and palpitations.  Gastrointestinal: Negative for abdominal pain.  Musculoskeletal: Negative for back pain.  Skin: Negative for rash.  Neurological: Negative for dizziness, syncope, weakness and headaches.  Hematological: Negative for adenopathy.  Psychiatric/Behavioral: Negative for agitation, decreased concentration, hallucinations and sleep disturbance. The patient is not nervous/anxious.     Past Medical History:  Diagnosis Date  . Anxiety   . Depression   . Migraine      Social History   Socioeconomic History  . Marital status: Single    Spouse name: Not on file  . Number of children: Not on file  . Years of education: Not on file  . Highest education level: Not on file  Occupational History  . Not on file  Social Needs  . Financial resource strain: Not on file  . Food insecurity:    Worry: Not on file    Inability: Not on file  . Transportation needs:    Medical: Not on file    Non-medical: Not on file  Tobacco Use  . Smoking status: Current Every Day Smoker  . Smokeless tobacco: Never  Used  Substance and Sexual Activity  . Alcohol use: Yes    Comment: 40 oz a day  . Drug use: No    Comment: in the past used coccaine. 3 yrs ago.  Marland Kitchen Sexual activity: Never  Lifestyle  . Physical activity:    Days per week: Not on file    Minutes per session: Not on file  . Stress: Not on file  Relationships  . Social connections:    Talks on phone: Not on file    Gets together: Not on file    Attends religious service: Not on file    Active member of club or organization: Not on file    Attends meetings of clubs or organizations: Not on file    Relationship status: Not on file  . Intimate partner violence:    Fear of current or ex partner: Not on file    Emotionally abused: Not on file    Physically abused: Not on file    Forced sexual activity: Not on file  Other Topics Concern  . Not on file  Social History Narrative  . Not on file    Past Surgical History:  Procedure Laterality Date  . HEMORROIDECTOMY      No family history on file.  No Known Allergies  Current Outpatient Medications on File Prior to Visit  Medication Sig Dispense Refill  . amLODipine (NORVASC) 10 MG tablet Take 1 tablet (10 mg total) by  mouth daily. 30 tablet 11  . buPROPion (WELLBUTRIN SR) 150 MG 12 hr tablet TAKE 1 TABLET BY MOUTH TWICE A DAY 180 tablet 0  . fenofibrate (TRICOR) 48 MG tablet TAKE 1 TABLET BY MOUTH EVERY DAY 90 tablet 1  . Multiple Vitamins-Minerals (MULTIVITAMIN WITH MINERALS) tablet Take 1 tablet by mouth daily.    . cloNIDine (CATAPRES) 0.1 MG tablet Take 1 tablet (0.1 mg total) by mouth 2 (two) times daily. (Patient not taking: Reported on 09/15/2018) 60 tablet 3  . hydrOXYzine (ATARAX/VISTARIL) 25 MG tablet Take 1 tablet (25 mg total) by mouth 3 (three) times daily as needed for anxiety. (Patient not taking: Reported on 09/15/2018) 30 tablet 3   No current facility-administered medications on file prior to visit.     BP 135/80   Pulse 61   Temp 98.2 F (36.8 C) (Oral)    Resp 16   Ht 6\' 4"  (1.93 m)   Wt 280 lb 9.6 oz (127.3 kg)   SpO2 98%   BMI 34.16 kg/m       Objective:   Physical Exam  General Mental Status- Alert. General Appearance- Not in acute distress.   Skin General: Color- Normal Color. Moisture- Normal Moisture.  Neck Carotid Arteries- Normal color. Moisture- Normal Moisture. No carotid bruits. No JVD.  Chest and Lung Exam Auscultation: Breath Sounds:-Normal.  Cardiovascular Auscultation:Rythm- Regular. Murmurs & Other Heart Sounds:Auscultation of the heart reveals- No Murmurs.  Abdomen Inspection:-Inspeection Normal. Palpation/Percussion:Note:No mass. Palpation and Percussion of the abdomen reveal- Non Tender, Non Distended + BS, no rebound or guarding.  Neurologic Cranial Nerve exam:- CN III-XII intact(No nystagmus), symmetric smile. Strength:- 5/5 equal and symmetric strength both upper and lower extremities.      Assessment & Plan:  Your bp is well controlled. Continue current medications.  For smoking, continue wellbutrin and try to stop smoking.  For high cholesterol, will get lipid panel check today.  For elevated sugar mild in past will get a1c today.  Follow up date to be determined after lab review.  Esperanza RichtersEdward Davonta Stroot, PA-C

## 2018-09-16 ENCOUNTER — Telehealth: Payer: Self-pay | Admitting: Medical

## 2018-09-16 LAB — HEMOGLOBIN A1C
EAG (MMOL/L): 6.6 (calc)
Hgb A1c MFr Bld: 5.8 % of total Hgb — ABNORMAL HIGH (ref <5.7)
Mean Plasma Glucose: 120 (calc)

## 2018-09-16 MED ORDER — ATORVASTATIN CALCIUM 10 MG PO TABS: 10.0000 mg | ORAL_TABLET | Freq: Every day | ORAL | 3 refills | Status: DC

## 2018-09-16 NOTE — Telephone Encounter (Signed)
Rx atorvastatin sent to pt pharmacy. 

## 2018-11-30 ENCOUNTER — Other Ambulatory Visit: Payer: Self-pay

## 2018-11-30 ENCOUNTER — Ambulatory Visit (HOSPITAL_BASED_OUTPATIENT_CLINIC_OR_DEPARTMENT_OTHER)
Admission: RE | Admit: 2018-11-30 | Discharge: 2018-11-30 | Disposition: A | Discharge status: Home / Self Care | Payer: 59 | Source: Ambulatory Visit | Attending: Family Medicine | Admitting: Family Medicine

## 2018-11-30 ENCOUNTER — Ambulatory Visit: Payer: 59 | Admitting: Family Medicine

## 2018-11-30 ENCOUNTER — Encounter: Payer: Self-pay | Admitting: Family Medicine

## 2018-11-30 VITALS — BP 120/82 | HR 77 | Temp 98.1°F | Ht 74.0 in | Wt 283.0 lb

## 2018-11-30 DIAGNOSIS — R6889 Other general symptoms and signs: Secondary | ICD-10-CM | POA: Insufficient documentation

## 2018-11-30 LAB — POCT INFLUENZA A/B
Influenza A, POC: NEGATIVE
Influenza B, POC: NEGATIVE

## 2018-11-30 MED ORDER — METHYLPREDNISOLONE ACETATE 80 MG/ML IJ SUSP
80.0000 mg | Freq: Once | INTRAMUSCULAR | Status: AC
  Administered 2018-11-30: 80 mg via INTRAMUSCULAR

## 2018-11-30 NOTE — Addendum Note (Signed)
Addended by: Scharlene Gloss B on: 11/30/2018 01:50 PM   Modules accepted: Orders

## 2018-11-30 NOTE — Patient Instructions (Addendum)
Continue to push fluids, practice good hand hygiene, and cover your mouth if you cough.  If you start having fevers, shaking or worsening shortness of breath, seek immediate care.  We will be in touch regarding your chest X-ray.   Let us know if you need anything.

## 2018-11-30 NOTE — Progress Notes (Signed)
Chief Complaint  Patient presents with  . Shortness of Breath    10 days  . Cough  . Generalized Body Aches    Walter Nichols here for URI complaints.  Duration: 10 days  Associated symptoms: sinus congestion, shortness of breath, myalgia and subj fevers, cough Denies: sinus pain, rhinorrhea, itchy watery eyes, ear pain, sore throat, difficulty swallowing and wheezing Treatment to date: supportive care Sick contacts: No  ROS:  Const: Denies current fevers HEENT: As noted in HPI Lungs: +SOB  Past Medical History:  Diagnosis Date  . Anxiety   . Depression   . Migraine     BP 120/82 (BP Location: Left Arm, Patient Position: Sitting, Cuff Size: Large)   Pulse 77   Temp 98.1 F (36.7 C) (Oral)   Ht 6\' 2"  (1.88 m)   Wt 283 lb (128.4 kg)   SpO2 96%   BMI 36.34 kg/m  General: Awake, alert, appears stated age HEENT: AT, [place], ears patent b/l and TM's neg, nares patent w/o discharge, pharynx pink and without exudates, MMM Neck: No masses or asymmetry Heart: RRR Lungs: CTAB, no accessory muscle use Psych: Age appropriate judgment and insight, normal mood and affect  Flu-like symptoms - Plan: DG Chest 2 View  Orders as above. IM Depo. Supportive care. Continue to push fluids, practice good hand hygiene, cover mouth when coughing. F/u prn. If starting to experience fevers, shaking, or increasing shortness of breath, seek immediate care. Pt voiced understanding and agreement to the plan.  Greater than 25 minutes were spent face to face with the patient with greater than 50% of this time spent counseling on URI s/s's, coronavirus, testing, prognosis.    Jilda Roche [place], DO 11/30/18 1:39 PM

## 2018-12-01 ENCOUNTER — Other Ambulatory Visit: Payer: Self-pay | Admitting: Family Medicine

## 2018-12-01 ENCOUNTER — Other Ambulatory Visit: Payer: Self-pay

## 2018-12-01 DIAGNOSIS — R6889 Other general symptoms and signs: Secondary | ICD-10-CM

## 2018-12-07 LAB — NOVEL CORONAVIRUS, NAA: SARS-CoV-2, NAA: NOT DETECTED

## 2018-12-15 ENCOUNTER — Ambulatory Visit: Payer: Self-pay | Admitting: Medical

## 2019-01-17 ENCOUNTER — Other Ambulatory Visit: Payer: Self-pay | Admitting: Medical

## 2019-01-18 ENCOUNTER — Encounter: Payer: Self-pay | Admitting: Medical

## 2019-01-18 ENCOUNTER — Other Ambulatory Visit: Payer: Self-pay

## 2019-01-18 ENCOUNTER — Ambulatory Visit (INDEPENDENT_AMBULATORY_CARE_PROVIDER_SITE_OTHER): Payer: 59 | Admitting: Medical

## 2019-01-18 VITALS — BP 140/81 | HR 68 | Wt 278.8 lb

## 2019-01-18 DIAGNOSIS — E785 Hyperlipidemia, unspecified: Secondary | ICD-10-CM | POA: Diagnosis not present

## 2019-01-18 DIAGNOSIS — I10 Essential (primary) hypertension: Secondary | ICD-10-CM

## 2019-01-18 DIAGNOSIS — R739 Hyperglycemia, unspecified: Secondary | ICD-10-CM | POA: Diagnosis not present

## 2019-01-18 NOTE — Patient Instructions (Signed)
Patient has history of high blood pressure.  He is on clonidine and amlodipine.  Unfortunately he does not have a blood pressure cuff and his house.  I did advise him to try to get 1 as it is important to know what his blood pressure readings have been.  I am going to also write him a prescription for electronic blood pressure cuff.  Hopefully it is covered benefit under his insurance or may be they will be able to give him a free machine.  He has a history of high cholesterol and is on medication for this presently.  Will place future lab for him to get done tomorrow along with metabolic panel.  Also mild sugar elevation in the past and will get A1c.  Patient continues to smoke and he states his been a little bit worse since he has been working at home.  I am going to refill his Wellbutrin and hopefully he will be able to cut back or stop.   Lab appointment be scheduled for tomorrow.  Also asked him to schedule 3 month follow-up of virtual visit or in office if possible.

## 2019-01-18 NOTE — Progress Notes (Signed)
Subjective:    Patient ID: Walter Nichols, male    DOB: Oct 26, 1970, 48 y.o.   MRN: [id number]  HPI  Virtual Visit via Video Note  I connected with Walter Nichols on 01/18/19 at 11:20 AM EDT by a video enabled telemedicine application and verified that I am speaking with the correct person using two identifiers.  Location: Patient: home Provider: home.  Did establish connection at first video works. But audio issues. So finished call by phone.  Visit done virtual visit due to pandemic.   I discussed the limitations of evaluation and management by telemedicine and the availability of in person appointments. The patient expressed understanding and agreed to proceed.   History of Present Illness:  Pt states he has been working from home for past 3 weeks.   Pt has high cholesterol and is on lipitor. He has been working out at home and jogging about a mile a day.  Pt has not checked his blood pressure cuff. He is on medications but can't check. No cardiac or neurologic signs or symptoms.  He is still smoking about 1 pack a day. Pt is using wellbutrin to try to quit smoking.  He has hx of substance abuse. He is abstaining from alcohol/staying clean. He has not been attending virtual visits.  Pt has slight elevated sugar in the past.    Observations/Objective: General- no acute distress. Lungs- appears unlabored. Neuro- gross motor function appears intact. (exam based on initial portion of video visit before audio failure became too difficult)   Assessment and Plan: Patient has history of high blood pressure.  He is on clonidine and amlodipine.  Unfortunately he does not have a blood pressure cuff and his house.  I did advise him to try to get 1 as it is important to know what his blood pressure readings have been.  I am going to also write him a prescription for electronic blood pressure cuff.  Hopefully it is covered benefit under his insurance or may be they will be able to give  him a free machine.  He has a history of high cholesterol and is on medication for this presently.  Will place future lab for him to get done tomorrow along with metabolic panel.  Also mild sugar elevation in the past and will get A1c.  Patient continues to smoke and he states his been a little bit worse since he has been working at home.  I am going to refill his Wellbutrin and hopefully he will be able to cut back or stop.   Lab appointment be scheduled for tomorrow.  Also asked him to schedule 3 month follow-up of virtual visit or in office if possible.  Follow Up Instructions:    I discussed the assessment and treatment plan with the patient. The patient was provided an opportunity to ask questions and all were answered. The patient agreed with the plan and demonstrated an understanding of the instructions.   The patient was advised to call back or seek an in-person evaluation if the symptoms worsen or if the condition fails to improve as anticipated.     Esperanza Richters, PA-C   Review of Systems  Constitutional: Negative for chills and fever.  Respiratory: Negative for cough, chest tightness, shortness of breath and wheezing.   Cardiovascular: Negative for chest pain and palpitations.  Hematological: Negative for adenopathy. Does not bruise/bleed easily.       Objective:   Physical Exam        Assessment &  Plan:

## 2019-01-19 ENCOUNTER — Encounter: Payer: Self-pay | Admitting: Medical

## 2019-01-19 ENCOUNTER — Other Ambulatory Visit (INDEPENDENT_AMBULATORY_CARE_PROVIDER_SITE_OTHER): Payer: 59

## 2019-01-19 ENCOUNTER — Other Ambulatory Visit: Payer: Self-pay

## 2019-01-19 DIAGNOSIS — R739 Hyperglycemia, unspecified: Secondary | ICD-10-CM

## 2019-01-19 DIAGNOSIS — I10 Essential (primary) hypertension: Secondary | ICD-10-CM

## 2019-01-19 DIAGNOSIS — E785 Hyperlipidemia, unspecified: Secondary | ICD-10-CM

## 2019-01-19 NOTE — Addendum Note (Signed)
Addended by: Cord Wilczynski M on: 01/19/2019 12:04 PM   Modules accepted: Orders  

## 2019-01-19 NOTE — Addendum Note (Signed)
Addended by: PRICE, KRISTY M on: 01/19/2019 12:04 PM   Modules accepted: Orders  

## 2019-01-20 LAB — LIPID PANEL
Cholesterol: 147 mg/dL (ref <200)
HDL: 32 mg/dL — ABNORMAL LOW (ref >=40)
LDL Cholesterol (Calc): 95 mg/dL (calc)
Non-HDL Cholesterol (Calc): 115 mg/dL (calc) (ref <130)
Total CHOL/HDL Ratio: 4.6 (calc) (ref <5.0)
Triglycerides: 100 mg/dL (ref <150)

## 2019-01-20 LAB — COMPREHENSIVE METABOLIC PANEL
AG Ratio: 1.6 (calc) (ref 1.0–2.5)
ALT: 36 U/L (ref 9–46)
AST: 26 U/L (ref 10–40)
Albumin: 4.3 g/dL (ref 3.6–5.1)
Alkaline phosphatase (APISO): 108 U/L (ref 36–130)
BUN/Creatinine Ratio: 7 (calc) (ref 6–22)
BUN: 11 mg/dL (ref 7–25)
CO2: 24 mmol/L (ref 20–32)
Calcium: 9.9 mg/dL (ref 8.6–10.3)
Chloride: 107 mmol/L (ref 98–110)
Creat: 1.68 mg/dL — ABNORMAL HIGH (ref 0.60–1.35)
Globulin: 2.7 g/dL (calc) (ref 1.9–3.7)
Glucose, Bld: 112 mg/dL — ABNORMAL HIGH (ref 65–99)
Potassium: 4.1 mmol/L (ref 3.5–5.3)
Sodium: 138 mmol/L (ref 135–146)
Total Bilirubin: 0.4 mg/dL (ref 0.2–1.2)
Total Protein: 7 g/dL (ref 6.1–8.1)

## 2019-01-20 LAB — HEMOGLOBIN A1C
Hgb A1c MFr Bld: 5.8 % of total Hgb — ABNORMAL HIGH (ref <5.7)
Mean Plasma Glucose: 120 (calc)
eAG (mmol/L): 6.6 (calc)

## 2019-02-21 ENCOUNTER — Other Ambulatory Visit: Payer: Self-pay | Admitting: Medical

## 2019-03-23 ENCOUNTER — Encounter: Payer: Self-pay | Admitting: Medical

## 2019-03-23 ENCOUNTER — Other Ambulatory Visit: Payer: Self-pay

## 2019-03-23 ENCOUNTER — Ambulatory Visit (INDEPENDENT_AMBULATORY_CARE_PROVIDER_SITE_OTHER): Payer: 59 | Admitting: Medical

## 2019-03-23 VITALS — Ht 74.0 in | Wt 276.0 lb

## 2019-03-23 DIAGNOSIS — N529 Male erectile dysfunction, unspecified: Secondary | ICD-10-CM | POA: Diagnosis not present

## 2019-03-23 MED ORDER — SILDENAFIL CITRATE 50 MG PO TABS: ORAL_TABLET | ORAL | 0 refills | Status: DC

## 2019-03-23 NOTE — Progress Notes (Signed)
Subjective:    Patient ID: Walter Nichols, male    DOB: 06-05-1971, 48 y.o.   MRN: [id number]  HPI  Virtual Visit via Telephone Note  I connected with Walter Nichols on 03/23/19 at  3:00 PM EDT by telephone and verified that I am speaking with the correct person using two identifiers.  Location: Patient: home Provider: home  Pt did not check his vitals today. No bp cuff at house. One week ago his bp was good.   I discussed the limitations, risks, security and privacy concerns of performing an evaluation and management service by telephone and the availability of in person appointments. I also discussed with the patient that there may be a patient responsible charge related to this service. The patient expressed understanding and agreed to proceed.   History of Present Illness: Pt states last 6 months he has met and developed relationship with lady. He states can get erection but states midway through intercourse will loose erection. Pt states he continues to smoke. Smoking up to pack a day. He used to do use alcohol heavily and other substances. Now abstaining from all substance except still smokes.  Pt states been with new partner for beginning of this year.  Pt estimates overall some erection issues over the years. Maybe 60% of the time.   Observations/Objective: General- no acute distress, pleasant, oriented. Speaking normal.  Assessment and Plan: For erectile dysfunction, recommend cutting back/ stopping smoking, eating healthy and can prescribe viagra 50 mg. Rx advisement given.  If no response to viagra then recommend referral to urologist or maybe testosterone panel.  Asked pt for 7 days call or my chart update.  Mackie Pai, PA-C  Follow Up Instructions:    I discussed the assessment and treatment plan with the patient. The patient was provided an opportunity to ask questions and all were answered. The patient agreed with the plan and demonstrated an understanding of  the instructions.   The patient was advised to call back or seek an in-person evaluation if the symptoms worsen or if the condition fails to improve as anticipated.  I provided 15  minutes of non-face-to-face time during this encounter.   Mackie Pai, PA-C   Review of Systems  Constitutional: Negative for chills, diaphoresis, fatigue and fever.  Respiratory: Negative for cough, chest tightness, wheezing and stridor.   Cardiovascular: Negative for chest pain and palpitations.  Gastrointestinal: Negative for abdominal pain.  Genitourinary: Negative for dysuria, genital sores, hematuria, penile pain, penile swelling and urgency.       ED  Musculoskeletal: Negative for back pain.  Skin: Negative for rash.  Hematological: Negative for adenopathy. Does not bruise/bleed easily.    Past Medical History:  Diagnosis Date  . Anxiety   . Depression   . Migraine      Social History   Socioeconomic History  . Marital status: Single    Spouse name: Not on file  . Number of children: Not on file  . Years of education: Not on file  . Highest education level: Not on file  Occupational History  . Not on file  Social Needs  . Financial resource strain: Not on file  . Food insecurity    Worry: Not on file    Inability: Not on file  . Transportation needs    Medical: Not on file    Non-medical: Not on file  Tobacco Use  . Smoking status: Current Every Day Smoker  . Smokeless tobacco: Never Used  Substance and  Sexual Activity  . Alcohol use: Yes    Comment: 40 oz a day  . Drug use: No    Comment: in the past used coccaine. 3 yrs ago.  Marland Kitchen Sexual activity: Never  Lifestyle  . Physical activity    Days per week: Not on file    Minutes per session: Not on file  . Stress: Not on file  Relationships  . Social Herbalist on phone: Not on file    Gets together: Not on file    Attends religious service: Not on file    Active member of club or organization: Not on file     Attends meetings of clubs or organizations: Not on file    Relationship status: Not on file  . Intimate partner violence    Fear of current or ex partner: Not on file    Emotionally abused: Not on file    Physically abused: Not on file    Forced sexual activity: Not on file  Other Topics Concern  . Not on file  Social History Narrative  . Not on file    Past Surgical History:  Procedure Laterality Date  . HEMORROIDECTOMY      No family history on file.  No Known Allergies  Current Outpatient Medications on File Prior to Visit  Medication Sig Dispense Refill  . amLODipine (NORVASC) 10 MG tablet Take 1 tablet (10 mg total) by mouth daily. 30 tablet 11  . atorvastatin (LIPITOR) 10 MG tablet TAKE 1 TABLET BY MOUTH EVERY DAY 90 tablet 1  . buPROPion (WELLBUTRIN SR) 150 MG 12 hr tablet TAKE 1 TABLET BY MOUTH TWICE A DAY 180 tablet 0  . cloNIDine (CATAPRES) 0.1 MG tablet Take 1 tablet (0.1 mg total) by mouth 2 (two) times daily. 60 tablet 3  . fenofibrate (TRICOR) 48 MG tablet TAKE 1 TABLET BY MOUTH EVERY DAY 90 tablet 1  . hydrOXYzine (ATARAX/VISTARIL) 25 MG tablet Take 1 tablet (25 mg total) by mouth 3 (three) times daily as needed for anxiety. 30 tablet 3  . Multiple Vitamins-Minerals (MULTIVITAMIN WITH MINERALS) tablet Take 1 tablet by mouth daily.     No current facility-administered medications on file prior to visit.     Ht '6\' 2"'$  (1.88 m)   Wt 276 lb (125.2 kg)   BMI 35.44 kg/m       Objective:   Physical Exam        Assessment & Plan:

## 2019-03-23 NOTE — Patient Instructions (Addendum)
For erectile dysfunction, recommend cutting back/ stopping smoking, eating healthy and can prescribe viagra 50 mg. Rx advisement given.  If no response to viagra then recommend referral to urologist or maybe testosterone panel.  Asked pt for 7 days call or my chart update.

## 2019-04-20 ENCOUNTER — Ambulatory Visit: Payer: 59 | Admitting: Medical

## 2019-05-01 ENCOUNTER — Other Ambulatory Visit: Payer: Self-pay

## 2019-05-01 ENCOUNTER — Ambulatory Visit (INDEPENDENT_AMBULATORY_CARE_PROVIDER_SITE_OTHER): Payer: 59 | Admitting: Medical

## 2019-05-01 DIAGNOSIS — N529 Male erectile dysfunction, unspecified: Secondary | ICD-10-CM | POA: Diagnosis not present

## 2019-05-01 DIAGNOSIS — I10 Essential (primary) hypertension: Secondary | ICD-10-CM

## 2019-05-01 MED ORDER — SILDENAFIL CITRATE 50 MG PO TABS: ORAL_TABLET | ORAL | 0 refills | Status: DC

## 2019-05-01 NOTE — Progress Notes (Signed)
   Subjective:    Patient ID: Walter Nichols, male    DOB: 10-Nov-1970, 48 y.o.   MRN: [id number]  HPI  Virtual Visit via Telephone Note  I connected with Walter Nichols on 05/01/19 at  1:00 PM EDT by telephone and verified that I am speaking with the correct person using two identifiers.  Location: Patient: home Provider: office   I discussed the limitations, risks, security and privacy concerns of performing an evaluation and management service by telephone and the availability of in person appointments. I also discussed with the patient that there may be a patient responsible charge related to this service. The patient expressed understanding and agreed to proceed.   History of Present Illness: Pt states he did respond very well to viagra. He states he only had to use 25 mg dose. He thought 50 mg was too strong. No adverse side effects.  Pt gives update that he is maintaining his sobriety.   Pt states stable well financially.  Pt has not checked his bp but is taking his meds. He will check at mom house and give me my chart update.    Observations/Objective: General- no acute distress. Pleasant. Oriented. Normal speech.   Assessment and Plan: Glad to hear that your Erectile dysfunction improved with viagra. Will refill that today.  Please update me on your bp readings.  Reminder to get flu vaccine this year.  Follow up date early January or as needed  Follow Up Instructions:    I discussed the assessment and treatment plan with the patient. The patient was provided an opportunity to ask questions and all were answered. The patient agreed with the plan and demonstrated an understanding of the instructions.   The patient was advised to call back or seek an in-person evaluation if the symptoms worsen or if the condition fails to improve as anticipated.  I provided 15  minutes of non-face-to-face time during this encounter.   Mackie Pai, PA-C     Review of Systems   Constitutional: Negative for appetite change, diaphoresis, fatigue and fever.  Respiratory: Negative for cough, chest tightness, shortness of breath and wheezing.   Cardiovascular: Negative for chest pain and palpitations.  Gastrointestinal: Negative for abdominal pain.  Genitourinary: Negative for decreased urine volume, difficulty urinating, discharge, flank pain, penile swelling and testicular pain.       Erectile dysfunction.  Skin: Negative for rash.  Neurological: Negative for dizziness, syncope, weakness, light-headedness and headaches.  Hematological: Negative for adenopathy. Does not bruise/bleed easily.  Psychiatric/Behavioral: Negative for behavioral problems and dysphoric mood. The patient is not nervous/anxious.        Objective:   Physical Exam        Assessment & Plan:

## 2019-05-01 NOTE — Patient Instructions (Addendum)
Glad to hear that your Erectile dysfunction improved with viagra. Will refill that today.  Please update me on your bp readings.  Reminder to get flu vaccine this year.  Follow up date early January or as needed

## 2019-06-01 ENCOUNTER — Encounter: Payer: Self-pay | Admitting: Medical

## 2019-06-01 ENCOUNTER — Ambulatory Visit (INDEPENDENT_AMBULATORY_CARE_PROVIDER_SITE_OTHER): Payer: 59 | Admitting: Medical

## 2019-06-01 ENCOUNTER — Other Ambulatory Visit: Payer: Self-pay

## 2019-06-01 DIAGNOSIS — R0683 Snoring: Secondary | ICD-10-CM

## 2019-06-01 MED ORDER — SILDENAFIL CITRATE 50 MG PO TABS: ORAL_TABLET | ORAL | 0 refills | Status: DC

## 2019-06-01 NOTE — Progress Notes (Signed)
Subjective:    Patient ID: Walter Nichols, male    DOB: 02-09-1971, 48 y.o.   MRN: [id number]  HPI  Virtual Visit via Telephone Note  I connected with Ben Habermann on 06/01/19 at  1:00 PM EDT by telephone and verified that I am speaking with the correct person using two identifiers.  Location: Patient: home Provider: office.   I discussed the limitations, risks, security and privacy concerns of performing an evaluation and management service by telephone and the availability of in person appointments. I also discussed with the patient that there may be a patient responsible charge related to this service. The patient expressed understanding and agreed to proceed.  Pt did not check bp or pulse today. Will check at moms on Sunday then will my chart me those results.  History of Present Illness: Pt states he has been snoring a lot. Family and significant other states severe. Mom has sleep apnea. Pt states feels fatigued daily but not severe. He thinks toss and turns all night.    Pt states he agrees after seeing video by girlfriend.   Observations/Objective: General- no acute distress, pleasant, oriented. Normal speech.   Assessment and Plan: For your snoring that sounds severe will go ahead and refer you to pulmonologist for evaluation for sleep apnea. If you don't get call from specialist office by 10 days then as you notify me so can follow up on the referral.  Asked to check bp this weekend and send me my chart update.  For hx of ED. Refilled viagra.  Follow up 3 months or as needed  Follow Up Instructions:    I discussed the assessment and treatment plan with the patient. The patient was provided an opportunity to ask questions and all were answered. The patient agreed with the plan and demonstrated an understanding of the instructions.   The patient was advised to call back or seek an in-person evaluation if the symptoms worsen or if the condition fails to improve as  anticipated.  I provided 15 minutes of non-face-to-face time during this encounter.   Mackie Pai, PA-C   Review of Systems  Constitutional: Negative for chills, fatigue and fever.  HENT: Negative for congestion, ear pain, hearing loss, mouth sores and nosebleeds.   Respiratory: Negative for cough, chest tightness, shortness of breath and wheezing.        Snoring recently severe per family.  Cardiovascular: Negative for chest pain and palpitations.  Gastrointestinal: Negative for abdominal pain, blood in stool, nausea and vomiting.  Musculoskeletal: Negative for back pain.  Skin: Negative for rash.  Neurological: Negative for dizziness and headaches.  Hematological: Negative for adenopathy. Does not bruise/bleed easily.    Past Medical History:  Diagnosis Date  . Anxiety   . Depression   . Migraine      Social History   Socioeconomic History  . Marital status: Single    Spouse name: Not on file  . Number of children: Not on file  . Years of education: Not on file  . Highest education level: Not on file  Occupational History  . Not on file  Social Needs  . Financial resource strain: Not on file  . Food insecurity    Worry: Not on file    Inability: Not on file  . Transportation needs    Medical: Not on file    Non-medical: Not on file  Tobacco Use  . Smoking status: Current Every Day Smoker  . Smokeless tobacco: Never Used  Substance and Sexual Activity  . Alcohol use: Yes    Comment: 40 oz a day  . Drug use: No    Comment: in the past used coccaine. 3 yrs ago.  Marland Kitchen. Sexual activity: Never  Lifestyle  . Physical activity    Days per week: Not on file    Minutes per session: Not on file  . Stress: Not on file  Relationships  . Social Musicianconnections    Talks on phone: Not on file    Gets together: Not on file    Attends religious service: Not on file    Active member of club or organization: Not on file    Attends meetings of clubs or organizations: Not on  file    Relationship status: Not on file  . Intimate partner violence    Fear of current or ex partner: Not on file    Emotionally abused: Not on file    Physically abused: Not on file    Forced sexual activity: Not on file  Other Topics Concern  . Not on file  Social History Narrative  . Not on file    Past Surgical History:  Procedure Laterality Date  . HEMORROIDECTOMY      No family history on file.  No Known Allergies  Current Outpatient Medications on File Prior to Visit  Medication Sig Dispense Refill  . amLODipine (NORVASC) 10 MG tablet Take 1 tablet (10 mg total) by mouth daily. 30 tablet 11  . atorvastatin (LIPITOR) 10 MG tablet TAKE 1 TABLET BY MOUTH EVERY DAY 90 tablet 1  . buPROPion (WELLBUTRIN SR) 150 MG 12 hr tablet TAKE 1 TABLET BY MOUTH TWICE A DAY 180 tablet 0  . cloNIDine (CATAPRES) 0.1 MG tablet Take 1 tablet (0.1 mg total) by mouth 2 (two) times daily. 60 tablet 3  . fenofibrate (TRICOR) 48 MG tablet TAKE 1 TABLET BY MOUTH EVERY DAY 90 tablet 1  . hydrOXYzine (ATARAX/VISTARIL) 25 MG tablet Take 1 tablet (25 mg total) by mouth 3 (three) times daily as needed for anxiety. 30 tablet 3  . Multiple Vitamins-Minerals (MULTIVITAMIN WITH MINERALS) tablet Take 1 tablet by mouth daily.    . sildenafil (VIAGRA) 50 MG tablet 1 tab po as needed one hour prior to sex 10 tablet 0   No current facility-administered medications on file prior to visit.     There were no vitals taken for this visit.      Objective:   Physical Exam        Assessment & Plan:

## 2019-06-01 NOTE — Patient Instructions (Addendum)
For your snoring that sounds severe will go ahead and refer you to pulmonologist for evaluation for sleep apnea. If you don't get call from specialist office by 10 days then as you notify me so can follow up on the referral.  Asked to check bp this weekend and send me my chart update.  For hx of ED. Refilled viagra.  Follow up 3 months or as needed

## 2019-06-14 ENCOUNTER — Institutional Professional Consult (permissible substitution): Payer: 59 | Admitting: Pulmonary Disease

## 2019-06-26 IMAGING — DX CHEST - 2 VIEW
2 series · 2 of 2 positions shown · non-contrast
Comparison: 08/02/2017

CLINICAL DATA: Cough, shortness of breath, congestion, and sore
throat for 2 weeks.

EXAM:
CHEST - 2 VIEW

[chest pa]
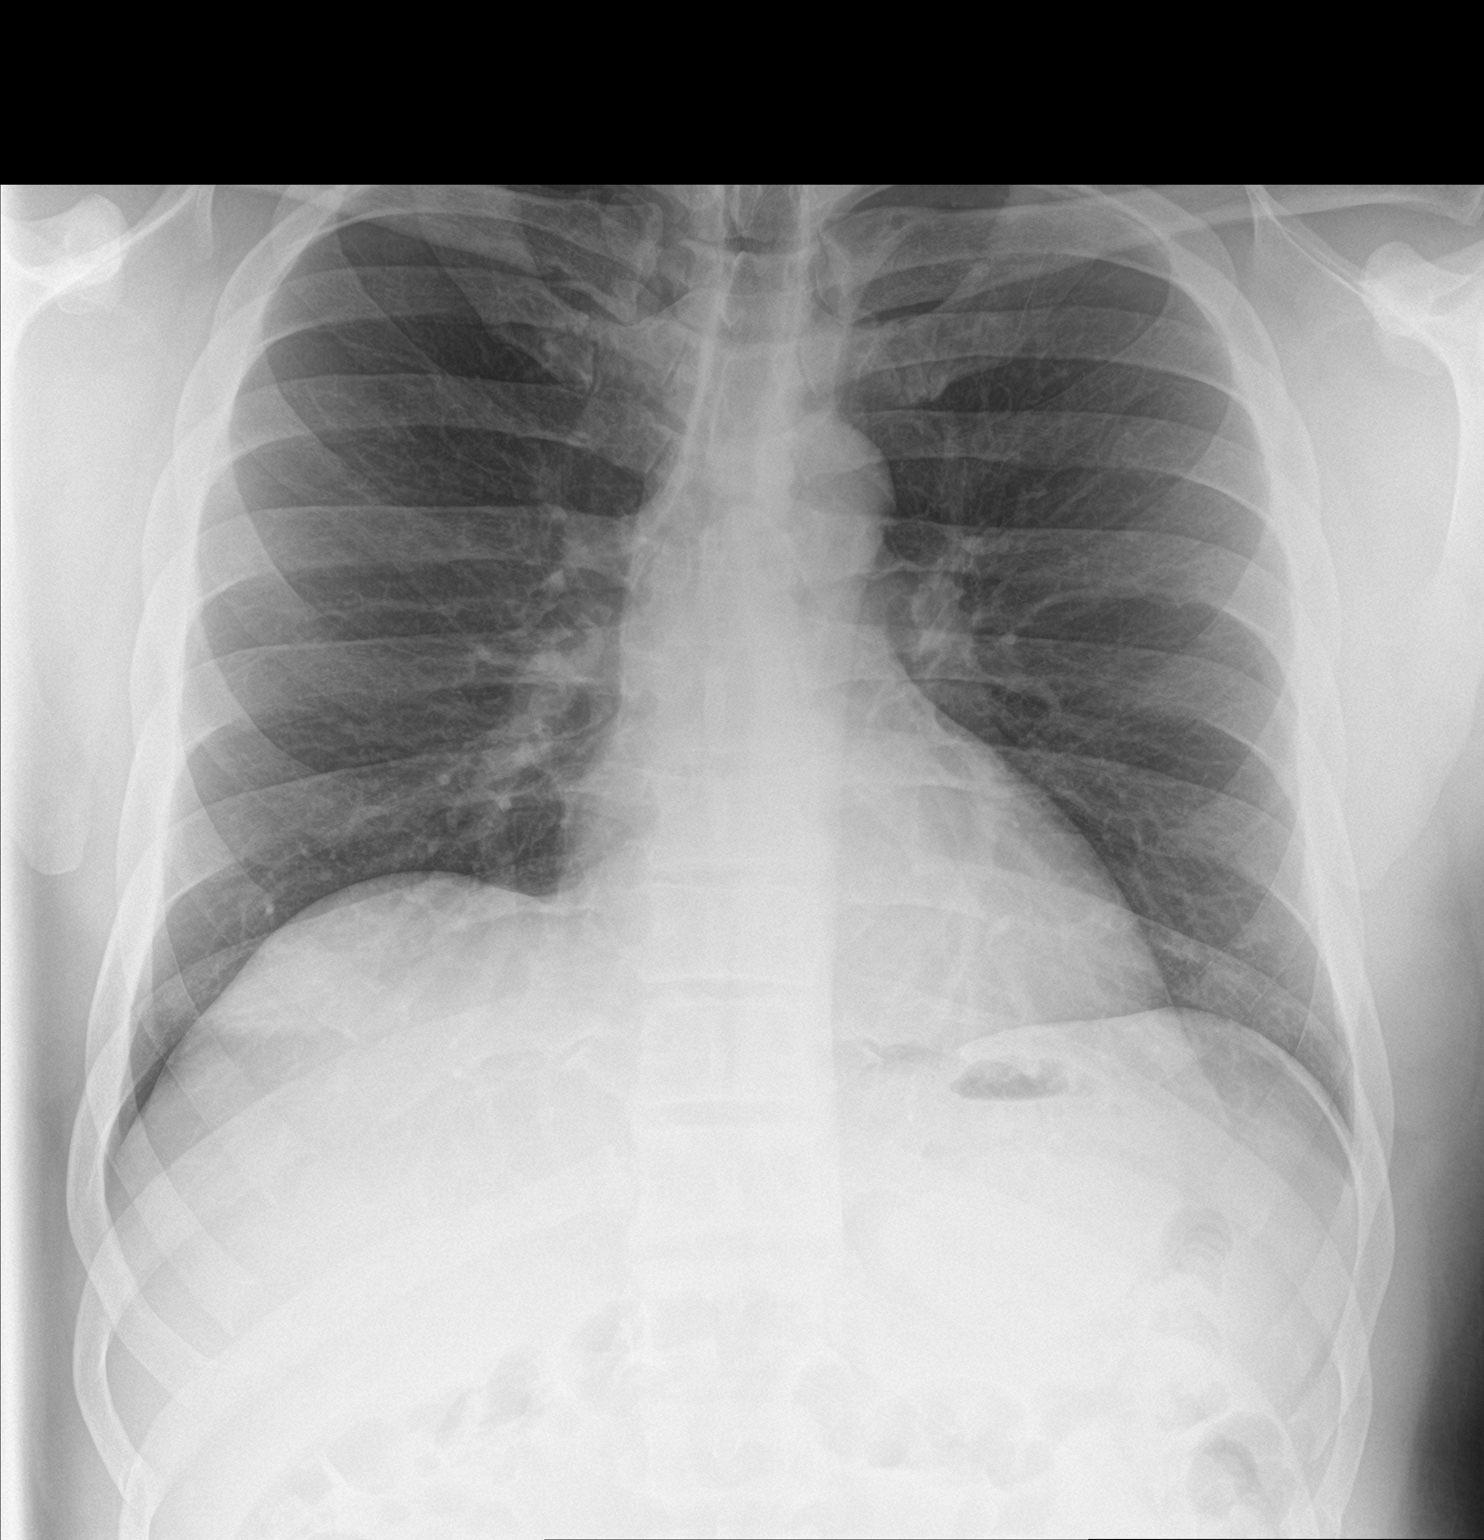

[chest lat]
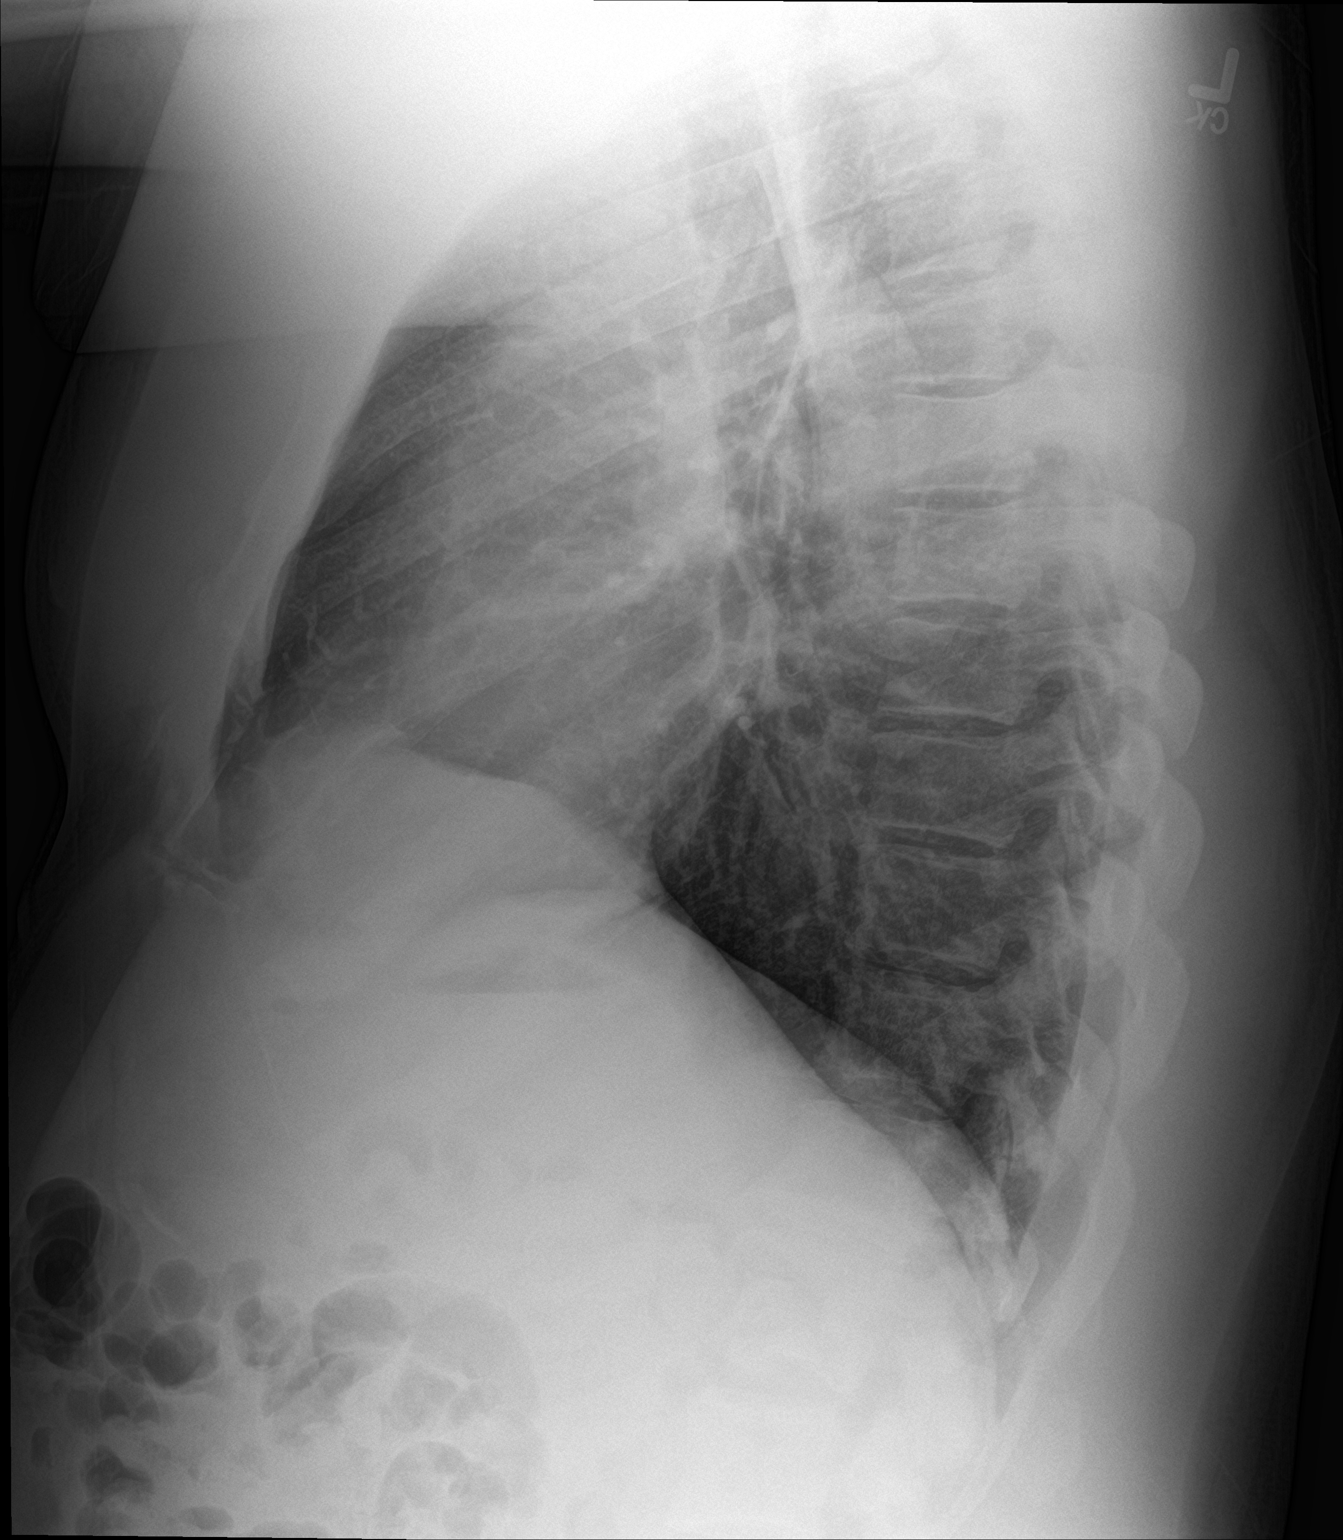

[2 of 2 positions shown; findings below may reference images not displayed]

FINDINGS: The cardiomediastinal silhouette is within normal limits. The lungs
are well inflated and clear. There is no evidence of pleural
effusion or pneumothorax. No acute osseous abnormality is
identified.
IMPRESSION: No active cardiopulmonary disease.

## 2019-06-27 ENCOUNTER — Telehealth: Payer: Self-pay | Admitting: Medical

## 2019-06-27 NOTE — Telephone Encounter (Signed)
06/27/19: Patient was to pick up paper prescription for electronic BP Cuff 01/18/19. Shredded document since it has not been pick up in several months.

## 2019-07-05 ENCOUNTER — Other Ambulatory Visit: Payer: Self-pay

## 2019-07-06 ENCOUNTER — Encounter: Payer: Self-pay | Admitting: Medical

## 2019-07-06 ENCOUNTER — Ambulatory Visit: Payer: 59 | Admitting: Medical

## 2019-07-06 VITALS — BP 137/80 | HR 57 | Temp 97.1°F | Resp 16 | Ht 74.0 in | Wt 282.0 lb

## 2019-07-06 DIAGNOSIS — F1911 Other psychoactive substance abuse, in remission: Secondary | ICD-10-CM | POA: Diagnosis not present

## 2019-07-06 MED ORDER — AMLODIPINE BESYLATE 10 MG PO TABS: 10.0000 mg | ORAL_TABLET | Freq: Every day | ORAL | 11 refills | Status: DC

## 2019-07-06 MED ORDER — HYDROXYZINE HCL 25 MG PO TABS: 25.0000 mg | ORAL_TABLET | Freq: Three times a day (TID) | ORAL | 3 refills | Status: DC | PRN

## 2019-07-06 NOTE — Patient Instructions (Addendum)
For hx of substance abuse and recent relapse with alcohol use will fill out paperwork for you to be off work after you coordinate with your employer.  I do thinks somehow you need to contact sponsor or get new sponsor. Attend on line meeting.  Pt no longer using wellbutrin since he never quit smoking.  For stress and anxiety will make hydroxyzine. Rx advisement given.  Advise continue to exercise.   Follow up 2 weeks or as needed
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
## 2019-07-11 ENCOUNTER — Telehealth: Payer: Self-pay

## 2019-07-11 NOTE — Telephone Encounter (Signed)
Copied from [place] [id number]. Topic: General - Other >> Jul 11, 2019  1:17 PM Celene Kras A wrote: Reason for CRM: Pt called and is wanting to confirm that his [employer] information was received. Pt is requesting a call back to confirm. Please advise.

## 2019-07-11 NOTE — Telephone Encounter (Signed)
Forms received  placed in provider folder.

## 2019-07-12 NOTE — Telephone Encounter (Signed)
Pt scheduled for appointment tomorrow.

## 2019-07-12 NOTE — Telephone Encounter (Signed)
I did get the form. I remember he had similar forms in the past and behavioral health either psychiatris, psychologist or counselor/therapist would be the preferred provider to fill out forms. Has he seen any of these in past since he went to rehab for substance abuse. When was last time he saw any of those.   Is he continuing to work presently. Did he talk to HR and how many weeks.  Other question.does he have copy of old paperwork. The exact form was probably filled out by mental health provider. If he has copy I would like to review. As is for same condition. This would be helpful if I am one to fill out.   He might need appointment early tomorrow afternoon to discuss. Can do phone call visit.

## 2019-07-13 ENCOUNTER — Ambulatory Visit (INDEPENDENT_AMBULATORY_CARE_PROVIDER_SITE_OTHER): Payer: 59 | Admitting: Medical

## 2019-07-13 ENCOUNTER — Other Ambulatory Visit: Payer: Self-pay

## 2019-07-13 DIAGNOSIS — F1911 Other psychoactive substance abuse, in remission: Secondary | ICD-10-CM | POA: Diagnosis not present

## 2019-07-13 NOTE — Progress Notes (Signed)
Subjective:    Patient ID: Walter Nichols, male    DOB: Jan 12, 1971, 48 y.o.   MRN: [id number]  HPI   Virtual Visit via Telephone Note  I connected with Walter Nichols on 07/13/19 at  1:20 PM EDT by telephone and verified that I am speaking with the correct person using two identifiers.  Location: Patient: home  Provider: office   I discussed the limitations, risks, security and privacy concerns of performing an evaluation and management service by telephone and the availability of in person appointments. I also discussed with the patient that there may be a patient responsible charge related to this service. The patient expressed understanding and agreed to proceed.    Follow Up Instructions:    I discussed the assessment and treatment plan with the patient. The patient was provided an opportunity to ask questions and all were answered. The patient agreed with the plan and demonstrated an understanding of the instructions.   The patient was advised to call back or seek an in-person evaluation if the symptoms worsen or if the condition fails to improve as anticipated.  I provided 15 minutes of non-face-to-face time during this encounter.   Mackie Pai, PA-C   Pt has history of substance abuse. He needs referral to [facility]. He has recent relapse of drinking alcohol and will need to get re-established wth them.. Pt saw counselor and did see psychiatrist before as well. He states went to location near [employer].  Pt asking 4 weeks of disability to start seeing counselor and psychiatrist again. See last visit note as well.    Review of Systems  Constitutional: Negative for chills, fatigue and fever.  Respiratory: Negative for cough, chest tightness, shortness of breath and wheezing.   Cardiovascular: Negative for chest pain and palpitations.  Gastrointestinal: Negative for abdominal pain and constipation.  Psychiatric/Behavioral: Negative for agitation, dysphoric  mood, sleep disturbance and suicidal ideas.       Overall stress and some anxiety. He is getting urges to use substances again.    Past Medical History:  Diagnosis Date  . Anxiety   . Depression   . Migraine      Social History   Socioeconomic History  . Marital status: Single    Spouse name: Not on file  . Number of children: Not on file  . Years of education: Not on file  . Highest education level: Not on file  Occupational History  . Not on file  Social Needs  . Financial resource strain: Not on file  . Food insecurity    Worry: Not on file    Inability: Not on file  . Transportation needs    Medical: Not on file    Non-medical: Not on file  Tobacco Use  . Smoking status: Current Every Day Smoker  . Smokeless tobacco: Never Used  Substance and Sexual Activity  . Alcohol use: Yes    Comment: 40 oz a day  . Drug use: No    Comment: in the past used coccaine. 3 yrs ago.  Marland Kitchen Sexual activity: Never  Lifestyle  . Physical activity    Days per week: Not on file    Minutes per session: Not on file  . Stress: Not on file  Relationships  . Social Herbalist on phone: Not on file    Gets together: Not on file    Attends religious service: Not on file    Active member of club or organization:  Not on file    Attends meetings of clubs or organizations: Not on file    Relationship status: Not on file  . Intimate partner violence    Fear of current or ex partner: Not on file    Emotionally abused: Not on file    Physically abused: Not on file    Forced sexual activity: Not on file  Other Topics Concern  . Not on file  Social History Narrative  . Not on file    Past Surgical History:  Procedure Laterality Date  . HEMORROIDECTOMY      No family history on file.  No Known Allergies  Current Outpatient Medications on File Prior to Visit  Medication Sig Dispense Refill  . amLODipine (NORVASC) 10 MG tablet Take 1 tablet (10 mg total) by mouth daily. 30  tablet 11  . atorvastatin (LIPITOR) 10 MG tablet TAKE 1 TABLET BY MOUTH EVERY DAY 90 tablet 1  . buPROPion (WELLBUTRIN SR) 150 MG 12 hr tablet TAKE 1 TABLET BY MOUTH TWICE A DAY 180 tablet 0  . cloNIDine (CATAPRES) 0.1 MG tablet Take 1 tablet (0.1 mg total) by mouth 2 (two) times daily. 60 tablet 3  . fenofibrate (TRICOR) 48 MG tablet TAKE 1 TABLET BY MOUTH EVERY DAY 90 tablet 1  . hydrOXYzine (ATARAX/VISTARIL) 25 MG tablet Take 1 tablet (25 mg total) by mouth 3 (three) times daily as needed for anxiety. 30 tablet 3  . Multiple Vitamins-Minerals (MULTIVITAMIN WITH MINERALS) tablet Take 1 tablet by mouth daily.    . sildenafil (VIAGRA) 50 MG tablet 1 tab po as needed one hour prior to sex 10 tablet 0   No current facility-administered medications on file prior to visit.     There were no vitals taken for this visit.      Objective:   Physical Exam By phone. General.- pleasant, alert, oriented. Normal speech.       Assessment & Plan:  Discussed form for disability best to be filled out by [employer] [facility]. In addition during your time off I want you to start meeting back with counselor and psychiatrist. Will place the referral but also ask that you call [facility] on this coming Monday  to get established. I will send [employer] letter explaining that we are working on you getting back in to fill out by [employer] [facility] specialist.  Follow up with me to be determined.  Esperanza Richters, PA-C

## 2019-07-13 NOTE — Patient Instructions (Signed)
Discussed form for disability best to be filled out by [employer]. In addition during your time off I want you to start meeting back with counselor and psychiatrist. Will place the referral but also ask that you call [facility] on this coming Monday  to get established. I will send [employer] letter explaining that we are working on you getting back in to fill out by [employer] health specialist.  Follow up with me to be determined.

## 2019-07-16 ENCOUNTER — Telehealth: Payer: Self-pay | Admitting: Medical

## 2019-07-16 NOTE — Telephone Encounter (Signed)
Place forms in providers basket.  

## 2019-07-16 NOTE — Telephone Encounter (Signed)
Pt dropped off document to be filled out by provider (5 pages [employer] documents) Pt would like to be called when ready or to fax. Please call pt to let pt know document is ready. Document put at front office tray under providers name.

## 2019-07-19 ENCOUNTER — Other Ambulatory Visit: Payer: Self-pay

## 2019-07-19 NOTE — Telephone Encounter (Signed)
Form is done and in folder. But want to clarify the exact dates he wants of. I think he told me oct 2-nov 23. Will you call him and verify. Let me know. Then ready to be faxed.

## 2019-07-20 ENCOUNTER — Encounter: Payer: Self-pay | Admitting: Medical

## 2019-07-20 ENCOUNTER — Ambulatory Visit (INDEPENDENT_AMBULATORY_CARE_PROVIDER_SITE_OTHER): Payer: 59 | Admitting: Medical

## 2019-07-20 ENCOUNTER — Other Ambulatory Visit: Payer: Self-pay

## 2019-07-20 VITALS — BP 133/77 | HR 67 | Temp 97.0°F | Resp 16 | Ht 74.0 in | Wt 282.8 lb

## 2019-07-20 DIAGNOSIS — F419 Anxiety disorder, unspecified: Secondary | ICD-10-CM | POA: Diagnosis not present

## 2019-07-20 DIAGNOSIS — F1911 Other psychoactive substance abuse, in remission: Secondary | ICD-10-CM

## 2019-07-20 MED ORDER — SILDENAFIL CITRATE 50 MG PO TABS: ORAL_TABLET | ORAL | 0 refills | Status: DC

## 2019-07-20 MED ORDER — BUSPIRONE HCL 15 MG PO TABS: 15.0000 mg | ORAL_TABLET | Freq: Two times a day (BID) | ORAL | 0 refills | Status: DC

## 2019-07-20 NOTE — Patient Instructions (Signed)
Hx of substance abuse. Please get in with psychiatrist and counselor. Call them today. Also call [employer] to make sure specialist get form they need to fill out.  For anxiety, stop hydroxyzine and start buspar.  For ED, rx viagra.  Follow up 1 month or as needed

## 2019-07-20 NOTE — Telephone Encounter (Signed)
Pt states the dates are Oct. 30th to Nov 30th. Forms are dated correctly and have been faxed.

## 2019-07-20 NOTE — Progress Notes (Signed)
Subjective:    Patient ID: Walter Nichols, male    DOB: May 09, 1971, 48 y.o.   MRN: [id number]  HPI  Pt in for follow up.  Recent stress related to child behavior and not doing her assignments.  Pt has anxiety hx and hx of substance abuse. Pt thinks some hydroxyzine. He feels little off. States does not feel like himself. Not specific in his description. On review no hx of buspar use. Pt recent more stressed after alcohol relapse.  If just filled out paperwork for pt to be off. During time off wanted him to schedule appointment with [facility] to see psychiatrist and counselor.    Pt has ED and needs refill of viagra.   Review of Systems  Constitutional: Negative for chills, fatigue and fever.  Respiratory: Negative for cough, chest tightness, shortness of breath and wheezing.   Cardiovascular: Negative for chest pain and palpitations.  Gastrointestinal: Negative for abdominal pain, diarrhea, nausea, rectal pain and vomiting.  Genitourinary: Negative for flank pain and frequency.  Musculoskeletal: Negative for back pain.  Skin: Negative for rash.  Neurological: Negative for dizziness, speech difficulty, weakness and light-headedness.  Hematological: Negative for adenopathy. Does not bruise/bleed easily.  Psychiatric/Behavioral: Negative for behavioral problems, confusion, hallucinations and sleep disturbance. The patient is nervous/anxious.     Past Medical History:  Diagnosis Date  . Anxiety   . Depression   . Migraine      Social History   Socioeconomic History  . Marital status: Single    Spouse name: Not on file  . Number of children: Not on file  . Years of education: Not on file  . Highest education level: Not on file  Occupational History  . Not on file  Social Needs  . Financial resource strain: Not on file  . Food insecurity    Worry: Not on file    Inability: Not on file  . Transportation needs    Medical: Not on file    Non-medical: Not on file  Tobacco  Use  . Smoking status: Current Every Day Smoker  . Smokeless tobacco: Never Used  Substance and Sexual Activity  . Alcohol use: Yes    Comment: 40 oz a day  . Drug use: No    Comment: in the past used coccaine. 3 yrs ago.  Marland Kitchen Sexual activity: Never  Lifestyle  . Physical activity    Days per week: Not on file    Minutes per session: Not on file  . Stress: Not on file  Relationships  . Social Musician on phone: Not on file    Gets together: Not on file    Attends religious service: Not on file    Active member of club or organization: Not on file    Attends meetings of clubs or organizations: Not on file    Relationship status: Not on file  . Intimate partner violence    Fear of current or ex partner: Not on file    Emotionally abused: Not on file    Physically abused: Not on file    Forced sexual activity: Not on file  Other Topics Concern  . Not on file  Social History Narrative  . Not on file    Past Surgical History:  Procedure Laterality Date  . HEMORROIDECTOMY      No family history on file.  No Known Allergies  Current Outpatient Medications on File Prior to Visit  Medication Sig Dispense Refill  . amLODipine (  NORVASC) 10 MG tablet Take 1 tablet (10 mg total) by mouth daily. 30 tablet 11  . atorvastatin (LIPITOR) 10 MG tablet TAKE 1 TABLET BY MOUTH EVERY DAY 90 tablet 1  . buPROPion (WELLBUTRIN SR) 150 MG 12 hr tablet TAKE 1 TABLET BY MOUTH TWICE A DAY 180 tablet 0  . cloNIDine (CATAPRES) 0.1 MG tablet Take 1 tablet (0.1 mg total) by mouth 2 (two) times daily. 60 tablet 3  . fenofibrate (TRICOR) 48 MG tablet TAKE 1 TABLET BY MOUTH EVERY DAY 90 tablet 1  . Multiple Vitamins-Minerals (MULTIVITAMIN WITH MINERALS) tablet Take 1 tablet by mouth daily.    . sildenafil (VIAGRA) 50 MG tablet 1 tab po as needed one hour prior to sex 10 tablet 0   No current facility-administered medications on file prior to visit.     BP 133/77   Pulse 67   Temp (!) 97  F (36.1 C) (Temporal)   Resp 16   Ht 6\' 2"  (1.88 m)   Wt 282 lb 12.8 oz (128.3 kg)   SpO2 100%   BMI 36.31 kg/m       Objective:   Physical Exam  General Mental Status- Alert. General Appearance- Not in acute distress.   Skin General: Color- Normal Color. Moisture- Normal Moisture.  Neck Carotid Arteries- Normal color. Moisture- Normal Moisture. No carotid bruits. No JVD.  Chest and Lung Exam Auscultation: Breath Sounds:-Normal.  Cardiovascular Auscultation:Rythm- Regular. Murmurs & Other Heart Sounds:Auscultation of the heart reveals- No Murmurs.  Abdomen Inspection:-Inspeection Normal. Palpation/Percussion:Note:No mass. Palpation and Percussion of the abdomen reveal- Non Tender, Non Distended + BS, no rebound or guarding.  Neurologic Cranial Nerve exam:- CN III-XII intact(No nystagmus), symmetric smile. Strength:- 5/5 equal and symmetric strength both upper and lower extremities.     Assessment & Plan:  Hx of substance abuse. Please get in with psychiatrist and counselor. Call them today. Also call [employer] to make sure specialist get form they need to fill out.  For anxiety, stop hydroxyzine and start buspar.  For ED, rx viagra.  Follow up 1 month or as needed  25 minutes spent with pt. 50% of time spent counseling on plan going forward.

## 2019-08-07 ENCOUNTER — Telehealth: Payer: Self-pay | Admitting: *Deleted

## 2019-08-07 NOTE — Telephone Encounter (Signed)
Patient notified that he will notified when paperwork comes in if he will need a virtual visit.

## 2019-08-07 NOTE — Telephone Encounter (Signed)
Copied from [place] [phone]. Topic: General - Other >> Aug 07, 2019  9:22 AM Antonieta Iba C wrote: Reason for CRM: pt says that he was unable to be seen within 30 day by therapist, pt called in to make provider aware. Also pt says that new leave paperwork will be coming to him to complete

## 2019-08-07 NOTE — Telephone Encounter (Signed)
Will review paperwork and see if I need him to make virtual visit to complete.

## 2019-08-14 ENCOUNTER — Other Ambulatory Visit: Payer: Self-pay | Admitting: Medical

## 2019-08-16 ENCOUNTER — Telehealth: Payer: Self-pay | Admitting: Medical

## 2019-08-16 NOTE — Telephone Encounter (Signed)
Notified pt paperwork has to be filled out by [facility] and Walter Nichols would be able to write a note stating pt will need more time to get forms filled out by [facility]. Pt was agreeable.

## 2019-08-16 NOTE — Telephone Encounter (Signed)
If you would remind pt to follow up with [employer] when they review my letter to see if they will give him extension so behavioral health can fill out form.

## 2019-08-16 NOTE — Telephone Encounter (Signed)
If you would fax over his letter. Have him follow up and see if they agree/would give extension. I really think when it comes to specialist type that they fill out. I think when they fill out disability claim likely to be successful as well as accurate.  Thanks, .mec

## 2019-08-16 NOTE — Telephone Encounter (Signed)
Opened to type letter for pt.

## 2019-08-16 NOTE — Telephone Encounter (Signed)
Letter faxed.

## 2019-08-17 ENCOUNTER — Telehealth: Payer: Self-pay | Admitting: Medical

## 2019-08-17 NOTE — Telephone Encounter (Signed)
I filled out [employer] short form about days absence. Not sure if was duplicate that already filled out. Checked media but did not find. Maybe prior form is yet to be scanned.

## 2019-08-17 NOTE — Telephone Encounter (Signed)
Asked Walter Nichols to fax short [employer] form on days off from work.

## 2019-08-20 ENCOUNTER — Other Ambulatory Visit: Payer: Self-pay

## 2019-08-20 ENCOUNTER — Ambulatory Visit (INDEPENDENT_AMBULATORY_CARE_PROVIDER_SITE_OTHER): Payer: 59 | Admitting: Medical

## 2019-08-20 DIAGNOSIS — F419 Anxiety disorder, unspecified: Secondary | ICD-10-CM

## 2019-08-20 DIAGNOSIS — F1911 Other psychoactive substance abuse, in remission: Secondary | ICD-10-CM | POA: Diagnosis not present

## 2019-08-20 MED ORDER — CLONAZEPAM 0.5 MG PO TABS: 0.5000 mg | ORAL_TABLET | Freq: Two times a day (BID) | ORAL | 0 refills | Status: DC | PRN

## 2019-08-20 NOTE — Progress Notes (Signed)
Subjective:    Patient ID: Walter Nichols, male    DOB: May 22, 1971, 48 y.o.   MRN: [id number]  HPI  Virtual Visit via Telephone Note  I connected with Walter Nichols on 08/20/19 at 10:00 AM EST by telephone and verified that I am speaking with the correct person using two identifiers.  Location: Patient: home Provider: office   I discussed the limitations, risks, security and privacy concerns of performing an evaluation and management service by telephone and the availability of in person appointments. I also discussed with the patient that there may be a patient responsible charge related to this service. The patient expressed understanding and agreed to proceed.   History of Present Illness:   Pt states he has fallen started using substance abuse again. He states this started after he found out his 2 children had be having relations. Pt finding this information set off his substance abuse. Pt children are seeing counselors. Child protection services just found out.   He started back drinking alcohol. He did not go back to other substances such as cocaine which was formerly his drug of choice.  Pt states there is safety plan in place.     Pt needs more time off to establish safety plan. Monitor and deal with [facility] counseling service, Kenney Houseman current counselor and maybe child protective.  Pt did reach out to his former counselor/psychiatrist.([facility] counselor).   With pt increased anxiety he states hydroxyzine is helping. Helps better than buspar.      Observations/Objective:  General- no acute distress. Pleasant, oriented but obvious sounds. depressed sound.      Assessment and Plan: For anxiety and stress, you can continue hydroxyzine for mild/moderate anxiety. For severe anxiety will make clonazepam available.But caution/rx advisement not to use in combination with alcohol. Under circumstances do want to make available.  If mood or anxiety worsens let me know.  Avoid  use of substance.   Will look at form that behavioral health should fill out.  Follow up 7 days or as needed  25 minutes spent with pt. 50% of time spent counseling pt on plan going forward.  Mackie Pai, PA-C  Follow Up Instructions:    I discussed the assessment and treatment plan with the patient. The patient was provided an opportunity to ask questions and all were answered. The patient agreed with the plan and demonstrated an understanding of the instructions.   The patient was advised to call back or seek an in-person evaluation if the symptoms worsen or if the condition fails to improve as anticipated.  I provided 25 minutes of non-face-to-face time during this encounter.   Mackie Pai, PA-C   Review of Systems  Constitutional: Negative for chills, fatigue and fever.  Respiratory: Negative for cough, chest tightness, shortness of breath and wheezing.   Cardiovascular: Negative for chest pain and palpitations.  Gastrointestinal: Negative for abdominal pain.  Musculoskeletal: Negative for back pain.  Skin: Negative for rash.  Neurological: Negative for dizziness, speech difficulty, weakness, numbness and headaches.  Psychiatric/Behavioral: Positive for dysphoric mood. Negative for sleep disturbance and suicidal ideas. The patient is nervous/anxious.        Depressed mood.    Past Medical History:  Diagnosis Date  . Anxiety   . Depression   . Migraine      Social History   Socioeconomic History  . Marital status: Single    Spouse name: Not on file  . Number of children: Not on file  . Years of  education: Not on file  . Highest education level: Not on file  Occupational History  . Not on file  Social Needs  . Financial resource strain: Not on file  . Food insecurity    Worry: Not on file    Inability: Not on file  . Transportation needs    Medical: Not on file    Non-medical: Not on file  Tobacco Use  . Smoking status: Current Every Day Smoker  .  Smokeless tobacco: Never Used  Substance and Sexual Activity  . Alcohol use: Yes    Comment: 40 oz a day  . Drug use: No    Comment: in the past used coccaine. 3 yrs ago.  Marland Kitchen Sexual activity: Never  Lifestyle  . Physical activity    Days per week: Not on file    Minutes per session: Not on file  . Stress: Not on file  Relationships  . Social Musician on phone: Not on file    Gets together: Not on file    Attends religious service: Not on file    Active member of club or organization: Not on file    Attends meetings of clubs or organizations: Not on file    Relationship status: Not on file  . Intimate partner violence    Fear of current or ex partner: Not on file    Emotionally abused: Not on file    Physically abused: Not on file    Forced sexual activity: Not on file  Other Topics Concern  . Not on file  Social History Narrative  . Not on file    Past Surgical History:  Procedure Laterality Date  . HEMORROIDECTOMY      No family history on file.  No Known Allergies  Current Outpatient Medications on File Prior to Visit  Medication Sig Dispense Refill  . amLODipine (NORVASC) 10 MG tablet Take 1 tablet (10 mg total) by mouth daily. 30 tablet 11  . atorvastatin (LIPITOR) 10 MG tablet TAKE 1 TABLET BY MOUTH EVERY DAY 90 tablet 1  . buPROPion (WELLBUTRIN SR) 150 MG 12 hr tablet TAKE 1 TABLET BY MOUTH TWICE A DAY 180 tablet 0  . busPIRone (BUSPAR) 15 MG tablet TAKE 1 TABLET (15 MG TOTAL) BY MOUTH 2 (TWO) TIMES DAILY. 60 tablet 0  . cloNIDine (CATAPRES) 0.1 MG tablet Take 1 tablet (0.1 mg total) by mouth 2 (two) times daily. 60 tablet 3  . fenofibrate (TRICOR) 48 MG tablet TAKE 1 TABLET BY MOUTH EVERY DAY 90 tablet 1  . Multiple Vitamins-Minerals (MULTIVITAMIN WITH MINERALS) tablet Take 1 tablet by mouth daily.    . sildenafil (VIAGRA) 50 MG tablet 1 tab po as needed one hour prior to sex 10 tablet 0   No current facility-administered medications on file prior  to visit.     There were no vitals taken for this visit.      Objective:   Physical Exam        Assessment & Plan:

## 2019-08-20 NOTE — Patient Instructions (Signed)
For anxiety and stress, you can continue hydroxyzine for mild/moderate anxiety. For severe anxiety will make clonazepam available.But caution/rx advisement not to use in combination with alcohol. Under circumstances do want to make available.  If mood or anxiety worsens let me know.  Avoid use of substance.   Will look at form that behavioral health should fill out.  Follow up 7 days or as needed

## 2019-08-20 NOTE — Telephone Encounter (Signed)
Pt has f/u scheduled for today.

## 2019-08-28 ENCOUNTER — Other Ambulatory Visit: Payer: Self-pay | Admitting: Medical

## 2019-09-04 ENCOUNTER — Telehealth: Payer: Self-pay | Admitting: *Deleted

## 2019-09-04 NOTE — Telephone Encounter (Signed)
Copied from [place] [id number]. Topic: General - Inquiry >> Sep 03, 2019  3:10 PM Mathis Bud wrote: Reason for CRM: Patient is extending his LOA from the 21st the 29th of December, patient states paperwork will be sent over soon.  Patient just wanted PCP to be aware. Call back [phone]

## 2019-09-05 ENCOUNTER — Other Ambulatory Visit: Payer: Self-pay | Admitting: Medical

## 2019-09-10 ENCOUNTER — Other Ambulatory Visit: Payer: Self-pay | Admitting: Medical

## 2019-09-10 MED ORDER — SILDENAFIL CITRATE 50 MG PO TABS: ORAL_TABLET | ORAL | 0 refills | Status: DC

## 2019-09-10 NOTE — Telephone Encounter (Signed)
Medication Refill - Medication: sildenafil (VIAGRA) 50 MG tablet  Has the patient contacted their pharmacy? Yes.   (Agent: If no, request that the patient contact the pharmacy for the refill.) (Agent: If yes, when and what did the pharmacy advise?)  Preferred Pharmacy (with phone number or street name): [id number] - [place], [place]  Agent: Please be advised that RX refills may take up to 3 business days. We ask that you follow-up with your pharmacy.

## 2019-09-11 ENCOUNTER — Institutional Professional Consult (permissible substitution): Payer: 59 | Admitting: Pulmonary Disease

## 2019-09-11 NOTE — Telephone Encounter (Signed)
Just be on the look out.

## 2019-09-19 ENCOUNTER — Ambulatory Visit (INDEPENDENT_AMBULATORY_CARE_PROVIDER_SITE_OTHER): Payer: Managed Care, Other (non HMO) | Admitting: Pulmonary Disease

## 2019-09-19 ENCOUNTER — Encounter: Payer: Self-pay | Admitting: Pulmonary Disease

## 2019-09-19 ENCOUNTER — Other Ambulatory Visit: Payer: Self-pay

## 2019-09-19 VITALS — BP 138/72 | HR 93 | Temp 97.9°F | Ht 75.0 in | Wt 288.4 lb

## 2019-09-19 DIAGNOSIS — R0683 Snoring: Secondary | ICD-10-CM

## 2019-09-19 NOTE — Progress Notes (Signed)
Subjective:    Patient ID: Walter Nichols, male    DOB: November 19, 1970, 49 y.o.   MRN: [id number]  Patient is being seen for snoring, daytime sleepiness  Snoring for many years Slight weight gain recently Usually goes to bed between 12 and 2 AM Takes him about 15 to 20 minutes to fall asleep 2 awakenings during the night Final awakening time between 7 and 745  Admits to occasional dryness of his mouth Occasional headaches His memory is fair  An active smoker Does have a history of hypertension  Mom has obstructive sleep apnea  Past Medical History:  Diagnosis Date  . Anxiety   . Depression   . Migraine    Social History   Socioeconomic History  . Marital status: Single    Spouse name: Not on file  . Number of children: Not on file  . Years of education: Not on file  . Highest education level: Not on file  Occupational History  . Not on file  Tobacco Use  . Smoking status: Current Every Day Smoker    Packs/day: 0.50    Types: Cigarettes  . Smokeless tobacco: Never Used  Substance and Sexual Activity  . Alcohol use: Yes    Comment: 40 oz a day  . Drug use: No    Comment: in the past used coccaine. 3 yrs ago.  Marland Kitchen Sexual activity: Never  Other Topics Concern  . Not on file  Social History Narrative  . Not on file   Social Determinants of Health   Financial Resource Strain:   . Difficulty of Paying Living Expenses: Not on file  Food Insecurity:   . Worried About Charity fundraiser in the Last Year: Not on file  . Ran Out of Food in the Last Year: Not on file  Transportation Needs:   . Lack of Transportation (Medical): Not on file  . Lack of Transportation (Non-Medical): Not on file  Physical Activity:   . Days of Exercise per Week: Not on file  . Minutes of Exercise per Session: Not on file  Stress:   . Feeling of Stress : Not on file  Social Connections:   . Frequency of Communication with Friends and Family: Not on file  . Frequency of Social Gatherings  with Friends and Family: Not on file  . Attends Religious Services: Not on file  . Active Member of Clubs or Organizations: Not on file  . Attends Archivist Meetings: Not on file  . Marital Status: Not on file  Intimate Partner Violence:   . Fear of Current or Ex-Partner: Not on file  . Emotionally Abused: Not on file  . Physically Abused: Not on file  . Sexually Abused: Not on file   History reviewed. No pertinent family history.   Review of Systems  Constitutional: Negative for fever and unexpected weight change.  HENT: Positive for sore throat. Negative for congestion, dental problem, ear pain, nosebleeds, postnasal drip, rhinorrhea, sinus pressure, sneezing and trouble swallowing.   Eyes: Negative for redness and itching.  Respiratory: Negative for cough, chest tightness, shortness of breath and wheezing.   Cardiovascular: Negative for palpitations and leg swelling.  Gastrointestinal: Negative for nausea and vomiting.  Genitourinary: Negative for dysuria.  Musculoskeletal: Negative for joint swelling.  Skin: Negative for rash.  Allergic/Immunologic: Negative.  Negative for environmental allergies, food allergies and immunocompromised state.  Neurological: Negative for headaches.  Hematological: Does not bruise/bleed easily.  Psychiatric/Behavioral: Negative for dysphoric mood. The patient  is nervous/anxious.        Objective:   Physical Exam Constitutional:      Appearance: He is obese.  HENT:     Head: Normocephalic and atraumatic.     Nose: Nose normal. No congestion.     Mouth/Throat:     Mouth: Mucous membranes are moist.     Comments: Mallampati 2 Eyes:     Extraocular Movements: Extraocular movements intact.     Pupils: Pupils are equal, round, and reactive to light.  Cardiovascular:     Rate and Rhythm: Normal rate and regular rhythm.     Pulses: Normal pulses.     Heart sounds: Normal heart sounds.  Pulmonary:     Effort: Pulmonary effort is  normal. No respiratory distress.     Breath sounds: Normal breath sounds. No stridor. No wheezing or rhonchi.  Musculoskeletal:        General: No swelling. Normal range of motion.     Cervical back: Normal range of motion and neck supple. No rigidity or tenderness.  Skin:    General: Skin is warm and dry.     Coloration: Skin is not jaundiced or pale.  Neurological:     General: No focal deficit present.     Mental Status: He is alert.    Vitals:   09/19/19 1134  BP: 138/72  Pulse: 93  Temp: 97.9 F (36.6 C)  SpO2: 96%   Results of the Epworth flowsheet 09/19/2019  Sitting and reading 1  Watching TV 3  Sitting, inactive in a public place (e.g. a theatre or a meeting) 1  As a passenger in a car for an hour without a break 0  Lying down to rest in the afternoon when circumstances permit 2  Sitting and talking to someone 0  Sitting quietly after a lunch without alcohol 1  In a car, while stopped for a few minutes in traffic 0  Total score 8      Assessment & Plan:  .  High probability of obstructive sleep apnea -Snoring -Excessive daytime sleepiness  .  Snoring .  Obesity .  Hypertension .  Excessive daytime  Pathophysiology of sleep disordered breathing discussed with the patient  Treatment options for sleep disordered breathing discussed with the patient  Plan: We will schedule patient for home sleep study Encouraged about weight loss efforts We will see him in about 3 months for follow-up  .  Encouraged to call with any significant concerns

## 2019-09-19 NOTE — Patient Instructions (Signed)
Moderate to high probability of significant obstructive sleep apnea  We will schedule you for home sleep study Update you with results as they become available  Set you up with a CPAP following results  Follow-up in 3 months  Weight loss efforts

## 2019-10-03 ENCOUNTER — Other Ambulatory Visit: Payer: Self-pay

## 2019-10-03 ENCOUNTER — Telehealth: Payer: Self-pay

## 2019-10-03 MED ORDER — SILDENAFIL CITRATE 50 MG PO TABS: ORAL_TABLET | ORAL | 0 refills | Status: DC

## 2019-10-03 NOTE — Telephone Encounter (Signed)
Forms faxed over to [facility]. Notified patient

## 2019-10-03 NOTE — Telephone Encounter (Signed)
Patient called in to request that Dr. Alvira Monday send in all information to [employer]. [employer] is stating they have not received  Anything as of yet. The fax number foe [employer] is [phone]. Claim number is [id number] please follow up with the patient at [phone] once this is done.

## 2019-10-06 ENCOUNTER — Other Ambulatory Visit: Payer: Self-pay | Admitting: Medical

## 2019-10-25 ENCOUNTER — Other Ambulatory Visit: Payer: Self-pay

## 2019-10-25 ENCOUNTER — Ambulatory Visit: Payer: Managed Care, Other (non HMO)

## 2019-10-25 DIAGNOSIS — R0683 Snoring: Secondary | ICD-10-CM

## 2019-10-26 DIAGNOSIS — G4733 Obstructive sleep apnea (adult) (pediatric): Secondary | ICD-10-CM

## 2019-10-27 DIAGNOSIS — G4733 Obstructive sleep apnea (adult) (pediatric): Secondary | ICD-10-CM

## 2019-10-30 ENCOUNTER — Telehealth: Payer: Self-pay | Admitting: Medical

## 2019-10-30 ENCOUNTER — Telehealth: Payer: Self-pay | Admitting: Pulmonary Disease

## 2019-10-30 DIAGNOSIS — G4733 Obstructive sleep apnea (adult) (pediatric): Secondary | ICD-10-CM

## 2019-10-30 DIAGNOSIS — R0683 Snoring: Secondary | ICD-10-CM

## 2019-10-30 NOTE — Telephone Encounter (Signed)
Dr. Wynona Neat has reviewed the home sleep test this showed severe obstructive sleep apnea.   Recommendations   Treatment options are CPAP with the settings auto 5 to 20.    Weight loss measures .   Advise against driving while sleepy & against medication with sedative side effects.    Make appointment for 3 months for compliance with download with Dr. Wynona Neat.   Called and spoke with Patient.  Dr.Olalere's results and recommendations given.  Patient aware of 3 month follow up with Dr. Wynona Neat or NP, for compliance.  Patient has current recall placed.  DME order placed.  Nothing further at this time.

## 2019-10-30 NOTE — Telephone Encounter (Signed)
Patient states that he was test for sleep apea last week, patient wanted  [name] to know about it.   Patient also prefers a smaller mask.

## 2019-10-30 NOTE — Telephone Encounter (Signed)
Advised patient that he will need to let pulmonologist know about mask.  He stated he will and he just wanted you to know that he go this done.

## 2019-11-09 ENCOUNTER — Other Ambulatory Visit: Payer: Self-pay | Admitting: Medical

## 2019-11-21 ENCOUNTER — Telehealth: Payer: Self-pay

## 2019-11-21 NOTE — Telephone Encounter (Signed)
Virtual visit scheduled.  

## 2019-11-21 NOTE — Telephone Encounter (Signed)
After hours call:   Caller needs a referral for an ENT as he has tonsil stones and he needs a refill of the viagra

## 2019-11-21 NOTE — Telephone Encounter (Signed)
I don't see that I have seen for tonsil issues in past. Have him schedule virtual visit wtihin next 3 days. After visit then can refer.

## 2019-11-22 ENCOUNTER — Other Ambulatory Visit: Payer: Self-pay

## 2019-11-22 ENCOUNTER — Ambulatory Visit (INDEPENDENT_AMBULATORY_CARE_PROVIDER_SITE_OTHER): Payer: 59 | Admitting: Medical

## 2019-11-22 DIAGNOSIS — N529 Male erectile dysfunction, unspecified: Secondary | ICD-10-CM

## 2019-11-22 DIAGNOSIS — J029 Acute pharyngitis, unspecified: Secondary | ICD-10-CM | POA: Diagnosis not present

## 2019-11-22 MED ORDER — AZITHROMYCIN 250 MG PO TABS: ORAL_TABLET | ORAL | 0 refills | Status: DC

## 2019-11-22 MED ORDER — SILDENAFIL CITRATE 50 MG PO TABS: ORAL_TABLET | ORAL | 0 refills | Status: DC

## 2019-11-22 NOTE — Progress Notes (Signed)
   Subjective:    Patient ID: Walter Nichols, male    DOB: 05/09/1971, 49 y.o.   MRN: [id number]  HPI  Virtual Visit via Video Note  I connected with Walter Nichols on 11/22/19 at 10:00 AM EST by a video enabled telemedicine application and verified that I am speaking with the correct person using two identifiers.  Location: Patient: home Provider: office   I discussed the limitations of evaluation and management by telemedicine and the availability of in person appointments. The patient expressed understanding and agreed to proceed.  History of Present Illness:  Pt states left side of his throat has swollen throat, faint tenderness to subumandibular node region irritation, and states has some slight tiny white clumps on left tonsil.   Pt states he has had on and off white dc from tonsil for 12 years.   Pt saw procedure on line to resolve issue.  Pt states only has issues on left side never on rt side.  No fever, no chills, no sweats or bodyaches.  Also hx of ED. Pt wants refill of his viagra.  Observations/Objective: General-no acute distress, pleasant, oriented. Lungs- on inspection lungs appear unlabored. Neck- no tracheal deviation or jvd on inspection. Neuro- gross motor function appears intact. Throat- on video. Mild enlarge left tonsil. Barely visible slight white dc visible.   Assessment and Plan: For pharyngitis rx azithromycin antibiotic. Referral to ENT as pt request based on long recurrent history described. Let us know if signs and symptoms worsen pending ent referral.  For ED refilled viagra  Follow up 7-10 days or as needed  Follow Up Instructions:    I discussed the assessment and treatment plan with the patient. The patient was provided an opportunity to ask questions and all were answered. The patient agreed with the plan and demonstrated an understanding of the instructions.   The patient was advised to call back or seek an in-person evaluation if the  symptoms worsen or if the condition fails to improve as anticipated.  I provided 20  minutes of non-face-to-face time during this encounter.   Esperanza Richters, PA-C   Review of Systems     Objective:   Physical Exam        Assessment & Plan:

## 2019-11-22 NOTE — Patient Instructions (Addendum)
For pharyngitis rx azithromycin antibiotic. Referral to ENT as pt request based on long recurrent history described. Let us know if signs and symptoms worsen pending ent referral.  For ED refilled viagra.  Follow up 7-10 days or as needed

## 2019-11-30 ENCOUNTER — Ambulatory Visit (INDEPENDENT_AMBULATORY_CARE_PROVIDER_SITE_OTHER): Payer: 59 | Admitting: Otolaryngology

## 2019-11-30 ENCOUNTER — Other Ambulatory Visit: Payer: Self-pay

## 2019-11-30 VITALS — Temp 97.5°F

## 2019-11-30 DIAGNOSIS — J029 Acute pharyngitis, unspecified: Secondary | ICD-10-CM

## 2019-11-30 DIAGNOSIS — K219 Gastro-esophageal reflux disease without esophagitis: Secondary | ICD-10-CM

## 2019-11-30 DIAGNOSIS — J310 Chronic rhinitis: Secondary | ICD-10-CM | POA: Diagnosis not present

## 2019-11-30 NOTE — Progress Notes (Signed)
HPI: Walter Nichols is a 49 y.o. male who presents is referred by by his PCP for evaluation of throat complaints.  He apparently has had a long history of "tonsil stones".  More recently has complained of sensation of throat discomfort more on the left side.  He always has a lot of mucus in his throat in the mornings that seems to be worse on the left side.  He has had no fevers.  He has no difficulty swallowing. He has recently been diagnosed with obstructive sleep apnea and just recently started a full shield CPAP machine. He denies trouble breathing through his nose. He does smoke 1/2 to 1 pack/day. He is normally fairly active but with recent Covid he has been unable to exercise adequately. He talks a lot at work and by the end of the day sometimes his voice gets a little raspy although his voice is fairly good today..  Past Medical History:  Diagnosis Date  . Anxiety   . Depression   . Migraine    Past Surgical History:  Procedure Laterality Date  . HEMORROIDECTOMY     Social History   Socioeconomic History  . Marital status: Single    Spouse name: Not on file  . Number of children: Not on file  . Years of education: Not on file  . Highest education level: Not on file  Occupational History  . Not on file  Tobacco Use  . Smoking status: Current Every Day Smoker    Packs/day: 0.50    Types: Cigarettes  . Smokeless tobacco: Never Used  Substance and Sexual Activity  . Alcohol use: Yes    Comment: 40 oz a day  . Drug use: No    Comment: in the past used coccaine. 3 yrs ago.  Marland Kitchen Sexual activity: Never  Other Topics Concern  . Not on file  Social History Narrative  . Not on file   Social Determinants of Health   Financial Resource Strain:   . Difficulty of Paying Living Expenses:   Food Insecurity:   . Worried About Charity fundraiser in the Last Year:   . Arboriculturist in the Last Year:   Transportation Needs:   . Film/video editor (Medical):   Marland Kitchen Lack of  Transportation (Non-Medical):   Physical Activity:   . Days of Exercise per Week:   . Minutes of Exercise per Session:   Stress:   . Feeling of Stress :   Social Connections:   . Frequency of Communication with Friends and Family:   . Frequency of Social Gatherings with Friends and Family:   . Attends Religious Services:   . Active Member of Clubs or Organizations:   . Attends Archivist Meetings:   Marland Kitchen Marital Status:    No family history on file. No Known Allergies Prior to Admission medications   Medication Sig Start Date End Date Taking? Authorizing Provider  amLODipine (NORVASC) 10 MG tablet Take 1 tablet (10 mg total) by mouth daily. 07/06/19  Yes Saguier, Percell Miller, PA-C  atorvastatin (LIPITOR) 10 MG tablet TAKE 1 TABLET BY MOUTH EVERY DAY 01/18/19  Yes Saguier, Percell Miller, PA-C  azithromycin (ZITHROMAX) 250 MG tablet Take 2 tablets by mouth on day 1, followed by 1 tablet by mouth daily for 4 days. 11/22/19  Yes Saguier, Percell Miller, PA-C  buPROPion [facility] SR) 150 MG 12 hr tablet TAKE 1 TABLET BY MOUTH TWICE A DAY 09/04/18  Yes Saguier, Percell Miller, PA-C  busPIRone (BUSPAR) 15  MG tablet TAKE 1 TABLET (15 MG TOTAL) BY MOUTH 2 (TWO) TIMES DAILY. 11/09/19  Yes Saguier, [name], PA-C  clonazePAM (KLONOPIN) 0.5 MG tablet Take 1 tablet (0.5 mg total) by mouth 2 (two) times daily as needed for anxiety. 08/20/19  Yes Saguier, [name], PA-C  cloNIDine (CATAPRES) 0.1 MG tablet Take 1 tablet (0.1 mg total) by mouth 2 (two) times daily. 07/27/16  Yes Saguier, [name], PA-C  fenofibrate (TRICOR) 48 MG tablet TAKE 1 TABLET BY MOUTH EVERY DAY 08/28/19  Yes Saguier, [name], PA-C  Multiple Vitamins-Minerals (MULTIVITAMIN WITH MINERALS) tablet Take 1 tablet by mouth daily.   Yes [provider]  sildenafil (VIAGRA) 50 MG tablet 1 tab po as needed one hour prior to sex 11/22/19  Yes Saguier, [name], PA-C     Positive ROS: Otherwise negative  All other systems have been reviewed and were otherwise  negative with the exception of those mentioned in the HPI and as above.  Physical Exam: Constitutional: Alert, well-appearing, no acute distress.  No significant hoarseness in the office today. Ears: External ears without lesions or tenderness. Ear canals are clear bilaterally with intact, clear TMs.  Nasal: External nose without lesions. Septum slightly deviated to the left with moderate rhinitis and swelling which is worse on the left side.  Mucus is clear with clear middle meatus bilaterally on anterior rhinoscopy. Oral: Lips and gums without lesions. Tongue and palate mucosa without lesions. Posterior oropharynx clear.  Tonsils are 2+ and symmetric bilaterally.  Tonsils appear normal bilaterally and are soft to palpation with no palpable masses.  No obvious tonsil stones noted on clinical exam in the office today. Fiberoptic laryngoscopy was performed through the right nostril.  The nasopharynx was clear.  The base of tongue was clear but had prominent lingual tonsillar tissue.  Epiglottis was normal.  Vocal cords were clear bilaterally with normal vocal cord mobility bilaterally.  Piriform sinuses were clear.  He had mild arytenoid edema consistent with probable laryngeal pharyngeal reflux. Neck: There was no palpable adenopathy or masses in the neck on either side. Respiratory: Breathing comfortably  Skin: No facial/neck lesions or rash noted.  Procedures  Assessment: Chronic rhinitis with clear mucus discharge. 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  Plan: Recommended and prescribed Nasacort 2 sprays each nostril at night on both sides.  As this will help improve nasal breathing as well as help reduce mucus and postnasal drainage. I also prescribed omeprazole 40 mg daily before dinner to take for the next month to see if this helps with the globus symptoms or excessive mucus buildup in the mornings.   Narda Bonds, MD   CC:

## 2019-12-14 ENCOUNTER — Other Ambulatory Visit: Payer: Self-pay | Admitting: Medical

## 2019-12-19 ENCOUNTER — Telehealth: Payer: Self-pay | Admitting: Medical

## 2019-12-19 MED ORDER — SILDENAFIL CITRATE 50 MG PO TABS: ORAL_TABLET | ORAL | 0 refills | Status: DC

## 2019-12-19 NOTE — Telephone Encounter (Signed)
Medication: sildenafil (VIAGRA) 50 MG tablet [[id number]]    Has the patient contacted their pharmacy? No. (If no, request that the patient contact the pharmacy for the refill.) (If yes, when and what did the pharmacy advise?)  Preferred Pharmacy (with phone number or street name): [id number] [place], [place] - [address]  [address] [place] [address]  Phone:  [phone] Fax:  [phone]  DEA #:  [id number]  Agent: Please be advised that RX refills may take up to 3 business days. We ask that you follow-up with your pharmacy.

## 2019-12-19 NOTE — Telephone Encounter (Signed)
Rx viagra refill sent to pt pharmacy.

## 2019-12-20 ENCOUNTER — Ambulatory Visit: Payer: 59 | Attending: Internal Medicine

## 2019-12-20 DIAGNOSIS — Z23 Encounter for immunization: Secondary | ICD-10-CM

## 2019-12-20 NOTE — Progress Notes (Signed)
   Covid-19 Vaccination Clinic  Name:  Walter Nichols    MRN: [id number] DOB: 23-Feb-1971  12/20/2019  [name] was observed post Covid-19 immunization for 15 minutes without incident. He was provided with Vaccine Information Sheet and instruction to access the V-Safe system.   [name] was instructed to call 911 with any severe reactions post vaccine: Marland Kitchen Difficulty breathing  . Swelling of face and throat  . A fast heartbeat  . A bad rash all over body  . Dizziness and weakness   Immunizations Administered    Name Date Dose VIS Date Route   Pfizer COVID-19 Vaccine 12/20/2019  9:13 AM 0.3 mL 08/24/2019 Intramuscular   Manufacturer: [employer], [employer]   Lot: [id number]   NDC: [address]-6

## 2019-12-25 ENCOUNTER — Encounter (INDEPENDENT_AMBULATORY_CARE_PROVIDER_SITE_OTHER): Payer: Self-pay

## 2019-12-25 NOTE — Progress Notes (Unsigned)
[facility]ed ref[facility] [address], Omeprazole 40 mg /Take 1 caps., by mouth QD before dinner. Quanity:30 no refills.Faxed on 12/04/2019. PM

## 2020-01-11 ENCOUNTER — Telehealth: Payer: Self-pay | Admitting: Medical

## 2020-01-11 NOTE — Telephone Encounter (Signed)
Caller: Hamed Debella  Call Back # [phone]  Patient states that he had a panic attack in [place] on [employer].   Patient states that she has never had panic attack before .  Please advise

## 2020-01-11 NOTE — Telephone Encounter (Signed)
Medication:  sildenafil (VIAGRA) 50 MG tablet [[id number]]   Has the patient contacted their pharmacy? No. (If no, request that the patient contact the pharmacy for the refill.) (If yes, when and what did the pharmacy advise?)  Preferred Pharmacy (with phone number or street name): [id number] [place], [place] - [address]  [address] [place] [address]  Phone:  [phone] Fax:  [phone]  DEA #:  [id number]  Agent: Please be advised that RX refills may take up to 3 business days. We ask that you follow-up with your pharmacy.

## 2020-01-14 ENCOUNTER — Ambulatory Visit: Payer: 59 | Attending: Internal Medicine

## 2020-01-14 DIAGNOSIS — Z23 Encounter for immunization: Secondary | ICD-10-CM

## 2020-01-14 NOTE — Progress Notes (Signed)
   Covid-19 Vaccination Clinic  Name:  Brady Schiller    MRN: [id number] DOB: 07-21-1971  01/14/2020  Mr. Klare was observed post Covid-19 immunization for 15 minutes without incident. He was provided with Vaccine Information Sheet and instruction to access the V-Safe system.   Mr. Rison was instructed to call 911 with any severe reactions post vaccine: Marland Kitchen Difficulty breathing  . Swelling of face and throat  . A fast heartbeat  . A bad rash all over body  . Dizziness and weakness   Immunizations Administered    Name Date Dose VIS Date Route   Pfizer COVID-19 Vaccine 01/14/2020  8:54 AM 0.3 mL 11/07/2018 Intramuscular   Manufacturer: [employer], [employer]   Lot: [id number]   NDC: [address]-3

## 2020-01-15 MED ORDER — SILDENAFIL CITRATE 50 MG PO TABS: ORAL_TABLET | ORAL | 0 refills | Status: DC

## 2020-01-15 NOTE — Telephone Encounter (Signed)
Rx viagra sent to pt pharmacy. 

## 2020-01-21 ENCOUNTER — Encounter: Payer: 59 | Admitting: Medical

## 2020-01-21 ENCOUNTER — Other Ambulatory Visit: Payer: Self-pay

## 2020-01-21 NOTE — Progress Notes (Signed)
   Subjective:    Patient ID: Walter Nichols, male    DOB: December 20, 1970, 49 y.o.   MRN: [id number]  HPI    Review of Systems     Objective:   Physical Exam        Assessment & Plan:  This encounter was created in error - please disregard.

## 2020-02-18 ENCOUNTER — Telehealth: Payer: Self-pay | Admitting: Medical

## 2020-02-18 NOTE — Telephone Encounter (Signed)
Medication: sildenafil (VIAGRA) 50 MG tablet   Has the patient contacted their pharmacy? No. (If no, request that the patient contact the pharmacy for the refill.) (If yes, when and what did the pharmacy advise?)  Preferred Pharmacy (with phone number or street name):  [id number] [place] - [address]  [address] [place] [address]  Phone:  [phone] Fax:  [phone]   Agent: Please be advised that RX refills may take up to 3 business days. We ask that you follow-up with your pharmacy.

## 2020-02-20 MED ORDER — SILDENAFIL CITRATE 50 MG PO TABS: ORAL_TABLET | ORAL | 0 refills | Status: DC

## 2020-02-23 ENCOUNTER — Other Ambulatory Visit: Payer: Self-pay | Admitting: Medical

## 2020-03-31 ENCOUNTER — Telehealth: Payer: Self-pay | Admitting: Medical

## 2020-03-31 NOTE — Telephone Encounter (Signed)
Medication: sildenafil (VIAGRA) 50 MG tablet [[id number]]   Has the patient contacted their pharmacy? No. (If no, request that the patient contact the pharmacy for the refill.) (If yes, when and what did the pharmacy advise?)  Preferred Pharmacy (with phone number or street name):  [id number] - [place], [place] - [address]  [address] [place] [address]  Phone:  [phone] Fax:  [phone]  DEA #:  [id number]        Agent: Please be advised that RX refills may take up to 3 business days. We ask that you follow-up with your pharmacy.

## 2020-04-01 ENCOUNTER — Telehealth: Payer: Self-pay | Admitting: Medical

## 2020-04-01 MED ORDER — SILDENAFIL CITRATE 50 MG PO TABS: ORAL_TABLET | ORAL | 0 refills | Status: DC

## 2020-04-01 NOTE — Telephone Encounter (Signed)
Rx viagra sent to pt pharmacy. 

## 2020-04-01 NOTE — Telephone Encounter (Signed)
Rx sent to pt pharmacy 

## 2020-04-11 ENCOUNTER — Other Ambulatory Visit: Payer: Self-pay

## 2020-04-11 ENCOUNTER — Ambulatory Visit (INDEPENDENT_AMBULATORY_CARE_PROVIDER_SITE_OTHER): Payer: 59 | Admitting: Medical

## 2020-04-11 VITALS — BP 136/68 | HR 61 | Temp 98.1°F | Resp 18 | Ht 75.0 in | Wt 268.6 lb

## 2020-04-11 DIAGNOSIS — K649 Unspecified hemorrhoids: Secondary | ICD-10-CM

## 2020-04-11 DIAGNOSIS — F419 Anxiety disorder, unspecified: Secondary | ICD-10-CM

## 2020-04-11 DIAGNOSIS — I10 Essential (primary) hypertension: Secondary | ICD-10-CM

## 2020-04-11 DIAGNOSIS — K6289 Other specified diseases of anus and rectum: Secondary | ICD-10-CM | POA: Diagnosis not present

## 2020-04-11 DIAGNOSIS — Z1211 Encounter for screening for malignant neoplasm of colon: Secondary | ICD-10-CM

## 2020-04-11 MED ORDER — TRAMADOL HCL 50 MG PO TABS: ORAL_TABLET | ORAL | 0 refills | Status: DC

## 2020-04-11 MED ORDER — HYDROCORTISONE ACETATE 25 MG RE SUPP
25.0000 mg | Freq: Two times a day (BID) | RECTAL | 0 refills | Status: DC
Start: 2020-04-11 — End: 2020-06-10

## 2020-04-11 MED ORDER — BUSPIRONE HCL 15 MG PO TABS: 15.0000 mg | ORAL_TABLET | Freq: Two times a day (BID) | ORAL | 0 refills | Status: DC

## 2020-04-11 NOTE — Patient Instructions (Addendum)
Bp is well controlled today. Continue current bp meds.  For anxiety- want you to take buspar twice daily. If anxiety/stress not improved let me know.  For rectal pain/hx of hemorrhoid, I rx'd anusol hc suppository, hydrate well, use stool softener, and limited on 3 tab tramadol to help with pain. Limited tramadol to avoid constipation but help with pain as you start suppository.  Screening referral for colonoscopy placed.  Follow up 10-14 days or as needed.

## 2020-04-11 NOTE — Addendum Note (Signed)
Addended by: Gwenevere Abbot on: 04/11/2020 09:34 AM   Modules accepted: Orders

## 2020-04-11 NOTE — Progress Notes (Signed)
Subjective:    Patient ID: Walter Nichols, male    DOB: 09/26/1970, 49 y.o.   MRN: [id number]  HPI  Pt in with some recent rectal area pain 6 days ago. Hx of hemorrhoids in the past. Pt states recent constipation, burning and pain in rectum. Pinkish redness when he wiped the other day.  Sunday had hard time passing bm. Pt has not had bm. He states does not want to have bm due to pain.   Pt recently very stressed about daugher psychiatric condition. Past week very anxious in [employer] randomly. Pt note not eating much as stress has increased. Pt was only taking buspar 1/2 tab a day.     Review of Systems  Constitutional: Negative for chills, fatigue and fever.  Respiratory: Negative for cough, chest tightness, shortness of breath and wheezing.   Cardiovascular: Negative for chest pain and palpitations.  Gastrointestinal: Negative for anal bleeding, diarrhea, nausea and vomiting.       Rectal area pain.  Musculoskeletal: Negative for back pain.  Skin: Negative for rash.  Neurological: Negative for dizziness and headaches.  Hematological: Negative for adenopathy. Does not bruise/bleed easily.  Psychiatric/Behavioral: Negative for behavioral problems, confusion and suicidal ideas. The patient is not nervous/anxious.        Stressed about young dautgher psychiatric issues. Daughter has new psychotherapist.    Past Medical History:  Diagnosis Date  . Anxiety   . Depression   . Migraine      Social History   Socioeconomic History  . Marital status: Single    Spouse name: Not on file  . Number of children: Not on file  . Years of education: Not on file  . Highest education level: Not on file  Occupational History  . Not on file  Tobacco Use  . Smoking status: Current Every Day Smoker    Packs/day: 0.50    Types: Cigarettes  . Smokeless tobacco: Never Used  Substance and Sexual Activity  . Alcohol use: Yes    Comment: 40 oz a day  . Drug use: No    Comment: in the past used  coccaine. 3 yrs ago.  Marland Kitchen Sexual activity: Never  Other Topics Concern  . Not on file  Social History Narrative  . Not on file   Social Determinants of Health   Financial Resource Strain:   . Difficulty of Paying Living Expenses:   Food Insecurity:   . Worried About Programme researcher, broadcasting/film/video in the Last Year:   . Barista in the Last Year:   Transportation Needs:   . Freight forwarder (Medical):   Marland Kitchen Lack of Transportation (Non-Medical):   Physical Activity:   . Days of Exercise per Week:   . Minutes of Exercise per Session:   Stress:   . Feeling of Stress :   Social Connections:   . Frequency of Communication with Friends and Family:   . Frequency of Social Gatherings with Friends and Family:   . Attends Religious Services:   . Active Member of Clubs or Organizations:   . Attends Banker Meetings:   Marland Kitchen Marital Status:   Intimate Partner Violence:   . Fear of Current or Ex-Partner:   . Emotionally Abused:   Marland Kitchen Physically Abused:   . Sexually Abused:     Past Surgical History:  Procedure Laterality Date  . HEMORROIDECTOMY      No family history on file.  No Known Allergies  Current Outpatient Medications  on File Prior to Visit  Medication Sig Dispense Refill  . amLODipine (NORVASC) 10 MG tablet Take 1 tablet (10 mg total) by mouth daily. 30 tablet 11  . atorvastatin (LIPITOR) 10 MG tablet TAKE 1 TABLET BY MOUTH EVERY DAY 90 tablet 1  . azithromycin (ZITHROMAX) 250 MG tablet Take 2 tablets by mouth on day 1, followed by 1 tablet by mouth daily for 4 days. (Patient not taking: Reported on 04/11/2020) 6 tablet 0  . buPROPion (WELLBUTRIN SR) 150 MG 12 hr tablet TAKE 1 TABLET BY MOUTH TWICE A DAY 180 tablet 0  . busPIRone (BUSPAR) 15 MG tablet TAKE 1 TABLET (15 MG TOTAL) BY MOUTH 2 (TWO) TIMES DAILY. 60 tablet 0  . clonazePAM (KLONOPIN) 0.5 MG tablet Take 1 tablet (0.5 mg total) by mouth 2 (two) times daily as needed for anxiety. 20 tablet 0  . cloNIDine  (CATAPRES) 0.1 MG tablet Take 1 tablet (0.1 mg total) by mouth 2 (two) times daily. 60 tablet 3  . fenofibrate (TRICOR) 48 MG tablet TAKE 1 TABLET BY MOUTH EVERY DAY 90 tablet 1  . Multiple Vitamins-Minerals (MULTIVITAMIN WITH MINERALS) tablet Take 1 tablet by mouth daily.    . sildenafil (VIAGRA) 50 MG tablet 1 tab po as needed one hour prior to sex 10 tablet 0   No current facility-administered medications on file prior to visit.    BP (!) 136/68   Pulse 61   Temp 98.1 F (36.7 C) (Oral)   Resp 18   Ht 6\' 3"  (1.905 m)   Wt (!) 268 lb 9.6 oz (121.8 kg)   SpO2 99%   BMI 33.57 kg/m       Objective:   Physical Exam  General- No acute distress. Pleasant patient. Neck- Full range of motion, no jvd Lungs- Clear, even and unlabored. Heart- regular rate and rhythm. Neurologic- CNII- XII grossly intact.  Rectal- on inspection of area slight small swollen tender probable hemorrhoid are at 3 o clock position. No induration. No fissure seen.       Assessment & Plan:  Bp is well controlled today. Continue current bp meds.  For anxiety- wa  nt you to take buspar twice daily. If anxiety/stress not improved let me know.  For rectal pain/hx of hemorrhoid, I rx'd anusol hc suppository, hydrate well, use stool softener, and limited on 3 tab tramadol to help with pain. Limited tramadol to avoid constipation but help with pain as you start suppository.  Screening referral for colonoscopy placed.  Follow up 10-14 days or as needed.  Time spent with patient today was 30  minutes which consisted of chart review, discussing diagnoses, counseling on mood, answering questions, treatment and documentation.  , PA-C

## 2020-04-18 ENCOUNTER — Encounter: Payer: Self-pay | Admitting: Gastroenterology

## 2020-04-18 ENCOUNTER — Other Ambulatory Visit: Payer: Self-pay

## 2020-04-18 ENCOUNTER — Ambulatory Visit (INDEPENDENT_AMBULATORY_CARE_PROVIDER_SITE_OTHER): Payer: 59 | Admitting: Medical

## 2020-04-18 VITALS — BP 134/85 | HR 61 | Resp 18 | Ht 75.0 in | Wt 268.8 lb

## 2020-04-18 DIAGNOSIS — K649 Unspecified hemorrhoids: Secondary | ICD-10-CM | POA: Diagnosis not present

## 2020-04-18 DIAGNOSIS — K611 Rectal abscess: Secondary | ICD-10-CM | POA: Diagnosis not present

## 2020-04-18 MED ORDER — AMOXICILLIN-POT CLAVULANATE 875-125 MG PO TABS: 1.0000 | ORAL_TABLET | Freq: Two times a day (BID) | ORAL | 0 refills | Status: DC

## 2020-04-18 NOTE — Patient Instructions (Signed)
You are improved but still have some symptoms. Seemed to respond to anusol hc suppository but by exam today and slight yellow coloration considering that you may have had small perirectal abscess. Appears resolving. Will go ahead and rx augmentin antibiotic in light of slight yellow dc.   Went ahead and referred to gastroenterologist as discussed.  Follow up 1 month with me or as needed to touch base on stress/anxiety regarding family issues. Sooner if needed

## 2020-04-18 NOTE — Progress Notes (Signed)
Subjective:    Patient ID: Walter Nichols, male    DOB: 11/12/1970, 49 y.o.   MRN: [id number]  HPI  Pt in for follow up regarding rectal pain and possible hemorrhoid. Pt given anusol hc suppository. Pt states much better presently. He states was concerned maybe fissure. But day after he left her state he had serosanguinous drainge/use.  He also updates me of family issues associated with depressed daughter. She may move to mothers house.     Review of Systems  Constitutional: Negative for chills and fatigue.  Respiratory: Negative for cough, chest tightness, shortness of breath and wheezing.   Cardiovascular: Negative for chest pain and palpitations.  Gastrointestinal: Negative for abdominal pain.  Musculoskeletal: Negative for back pain.  Neurological: Negative for dizziness, weakness, numbness and headaches.  Hematological: Negative for adenopathy. Does not bruise/bleed easily.     Past Medical History:  Diagnosis Date   Anxiety    Depression    Migraine      Social History   Socioeconomic History   Marital status: Single    Spouse name: Not on file   Number of children: Not on file   Years of education: Not on file   Highest education level: Not on file  Occupational History   Not on file  Tobacco Use   Smoking status: Current Every Day Smoker    Packs/day: 0.50    Types: Cigarettes   Smokeless tobacco: Never Used  Substance and Sexual Activity   Alcohol use: Yes    Comment: 40 oz a day   Drug use: No    Comment: in the past used coccaine. 3 yrs ago.   Sexual activity: Never  Other Topics Concern   Not on file  Social History Narrative   Not on file   Social Determinants of Health   Financial Resource Strain:    Difficulty of Paying Living Expenses:   Food Insecurity:    Worried About Programme researcher, broadcasting/film/video in the Last Year:    Barista in the Last Year:   Transportation Needs:    Freight forwarder (Medical):    Lack of  Transportation (Non-Medical):   Physical Activity:    Days of Exercise per Week:    Minutes of Exercise per Session:   Stress:    Feeling of Stress :   Social Connections:    Frequency of Communication with Friends and Family:    Frequency of Social Gatherings with Friends and Family:    Attends Religious Services:    Active Member of Clubs or Organizations:    Attends Engineer, structural:    Marital Status:   Intimate Partner Violence:    Fear of Current or Ex-Partner:    Emotionally Abused:    Physically Abused:    Sexually Abused:     Past Surgical History:  Procedure Laterality Date   HEMORROIDECTOMY      No family history on file.  No Known Allergies  Current Outpatient Medications on File Prior to Visit  Medication Sig Dispense Refill   amLODipine (NORVASC) 10 MG tablet Take 1 tablet (10 mg total) by mouth daily. 30 tablet 11   atorvastatin (LIPITOR) 10 MG tablet TAKE 1 TABLET BY MOUTH EVERY DAY 90 tablet 1   azithromycin (ZITHROMAX) 250 MG tablet Take 2 tablets by mouth on day 1, followed by 1 tablet by mouth daily for 4 days. (Patient not taking: Reported on 04/11/2020) 6 tablet 0   buPROPion [facility]  SR) 150 MG 12 hr tablet TAKE 1 TABLET BY MOUTH TWICE A DAY 180 tablet 0   busPIRone (BUSPAR) 15 MG tablet Take 1 tablet (15 mg total) by mouth 2 (two) times daily. 60 tablet 0   cloNIDine (CATAPRES) 0.1 MG tablet Take 1 tablet (0.1 mg total) by mouth 2 (two) times daily. 60 tablet 3   fenofibrate (TRICOR) 48 MG tablet TAKE 1 TABLET BY MOUTH EVERY DAY 90 tablet 1   hydrocortisone (ANUSOL-HC) 25 MG suppository Place 1 suppository (25 mg total) rectally 2 (two) times daily. 20 suppository 0   Multiple Vitamins-Minerals (MULTIVITAMIN WITH MINERALS) tablet Take 1 tablet by mouth daily.     sildenafil (VIAGRA) 50 MG tablet 1 tab po as needed one hour prior to sex 10 tablet 0   traMADol (ULTRAM) 50 MG tablet 1 tab po q 6 hours prn pain 3  tablet 0   No current facility-administered medications on file prior to visit.    BP 134/85    Pulse 61    Resp 18    Ht 6\' 3"  (1.905 m)    Wt 268 lb 12.8 oz (121.9 kg)    SpO2 99%    BMI 33.60 kg/m       Objective:   Physical Exam  General- No acute distress. Pleasant patient. Neck- Full range of motion, no jvd Lungs- Clear, even and unlabored. Heart- regular rate and rhythm. Neurologic- CNII- XII grossly intact.  Rectal- Rectal- on inspection of area that does not look swollen. Small draining tiny area that looks yellow but when press area more serosanguinous.. No induration. No fissure seen.       Assessment & Plan:  You are improved but still have some symptoms. Seemed to respond to anusol hc suppository but by exam today and slight yellow coloration considering that you may have had small perirectal abscess. Appears resolving. Will go ahead and rx augmentin antibiotic in light of slight yellow dc.   Went ahead and referred to gastroenterologist as discussed.  Follow up 1 month with me or as needed to touch base on stress/anxiety regarding family issues. Sooner if needed  , PA-C    Time spent with patient today was 20 minutes which consisted of chart review, discussing diagnosis, work up, treatment and documentation.

## 2020-05-15 ENCOUNTER — Telehealth: Payer: Self-pay | Admitting: Medical

## 2020-05-15 MED ORDER — SILDENAFIL CITRATE 50 MG PO TABS: ORAL_TABLET | ORAL | 0 refills | Status: DC

## 2020-05-15 NOTE — Telephone Encounter (Signed)
Medication: sildenafil (VIAGRA) 50 MG tablet [[id number]]   Has the patient contacted their pharmacy? No. (If no, request that the patient contact the pharmacy for the refill.) (If yes, when and what did the pharmacy advise?)  Preferred Pharmacy (with phone number or street name): [id number] - [place], [place] - [address]  [address] [place] [address]  Phone:  [phone] Fax:  [phone]  DEA #:  [id number]  Agent: Please be advised that RX refills may take up to 3 business days. We ask that you follow-up with your pharmacy.

## 2020-05-15 NOTE — Telephone Encounter (Signed)
Rx sent 

## 2020-05-20 ENCOUNTER — Ambulatory Visit: Payer: 59 | Admitting: Medical

## 2020-05-20 DIAGNOSIS — Z0289 Encounter for other administrative examinations: Secondary | ICD-10-CM

## 2020-06-10 ENCOUNTER — Encounter: Payer: Self-pay | Admitting: Gastroenterology

## 2020-06-10 ENCOUNTER — Ambulatory Visit: Payer: 59 | Admitting: Gastroenterology

## 2020-06-10 VITALS — BP 116/80 | HR 77 | Ht 75.0 in | Wt 272.4 lb

## 2020-06-10 DIAGNOSIS — Z1211 Encounter for screening for malignant neoplasm of colon: Secondary | ICD-10-CM | POA: Diagnosis not present

## 2020-06-10 DIAGNOSIS — Z8719 Personal history of other diseases of the digestive system: Secondary | ICD-10-CM

## 2020-06-10 DIAGNOSIS — K921 Melena: Secondary | ICD-10-CM | POA: Diagnosis not present

## 2020-06-10 DIAGNOSIS — K6289 Other specified diseases of anus and rectum: Secondary | ICD-10-CM

## 2020-06-10 MED ORDER — CLENPIQ 10-3.5-12 MG-GM -GM/160ML PO SOLN: 1.0000 | ORAL | 0 refills | Status: DC

## 2020-06-10 NOTE — Patient Instructions (Addendum)
If you are age 49 or older, your body mass index should be between 23-30. Your Body mass index is 34.04 kg/m. If this is out of the aforementioned range listed, please consider follow up with your Primary Care Provider.  If you are age 19 or younger, your body mass index should be between 19-25. Your Body mass index is 34.04 kg/m. If this is out of the aformentioned range listed, please consider follow up with your Primary Care Provider.   You have been scheduled for a colonoscopy. Please follow written instructions given to you at your visit today.  Please pick up your prep supplies at the pharmacy within the next 1-3 days. If you use inhalers (even only as needed), please bring them with you on the day of your procedure.  We have sent the following medications to your pharmacy for you to pick up at your convenience: Clenpiq It was a pleasure to see you today!  Vito Cirigliano, D.O.

## 2020-06-10 NOTE — Progress Notes (Signed)
Chief Complaint: Rectal pain, colon cancer screening   Referring Provider:     Esperanza Richters, PA-C   HPI:     Walter Nichols is a 49 y.o. male with a history of HTN, HLD, depression, anxiety, referred to the Gastroenterology Clinic for evaluation of colon cancer screening and recent rectal symptoms.  He reports recent onset BRBPR and rectal pain/irritation. Has a hx of hemorrhoids s/p hemorrhoidectomy approximately 9 and 5 years ago, along with history of rectal fissure in the past. No abdominal pain.   He was last seen by his PCM for this issue on 04/18/2020.  Symptoms were improving at that time with Anusol HC Suppository.  Exam at that time with small draining area of serosanguineous fluid, c/f resolving small perirectal abscess.  This was treated with Augmentin and referred to GI.   Today, he states sxs now essentially resolved.  He presents today to discuss CRC screening.  No previous colonoscopy.  No known family history of CRC, GI malignancy, liver disease, pancreatic disease, or IBD.    Past Medical History:  Diagnosis Date  . Alcoholism (HCC)   . Anal fissure   . Anxiety   . Depression   . Migraine      Past Surgical History:  Procedure Laterality Date  . HEMORROIDECTOMY     x2   Family History  Problem Relation Age of Onset  . Lung cancer Maternal Grandmother   . Colon cancer Neg Hx   . Esophageal cancer Neg Hx    Social History   Tobacco Use  . Smoking status: Current Every Day Smoker    Packs/day: 0.50    Types: Cigarettes  . Smokeless tobacco: Never Used  Vaping Use  . Vaping Use: Never used  Substance Use Topics  . Alcohol use: Yes    Comment: 40 oz a day/ocassionally   . Drug use: No    Comment: in the past used coccaine. 3 yrs ago.   Current Outpatient Medications  Medication Sig Dispense Refill  . amLODipine (NORVASC) 10 MG tablet Take 1 tablet (10 mg total) by mouth daily. 30 tablet 11  . atorvastatin (LIPITOR) 10 MG tablet  TAKE 1 TABLET BY MOUTH EVERY DAY 90 tablet 1  . busPIRone (BUSPAR) 15 MG tablet Take 1 tablet (15 mg total) by mouth 2 (two) times daily. 60 tablet 0  . Cyanocobalamin (BL VITAMIN B-12 PO) Take 1 tablet by mouth daily.    . Multiple Vitamins-Minerals (MULTIVITAMIN WITH MINERALS) tablet Take 1 tablet by mouth as needed (3 times a week. 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     . sildenafil (VIAGRA) 50 MG tablet 1 tab po as needed one hour prior to sex 10 tablet 0  . triamcinolone (NASACORT) 55 MCG/ACT AERO nasal inhaler as needed.    Marland Kitchen VITAMIN A PO Take 1 tablet by mouth daily.    Marland Kitchen VITAMIN D PO Take 1 tablet by mouth daily.    Marland Kitchen VITAMIN E PO Take 1 tablet by mouth daily.    . traMADol (ULTRAM) 50 MG tablet 1 tab po q 6 hours prn pain (Patient not taking: Reported on 06/10/2020) 3 tablet 0   No current facility-administered medications for this visit.   No Known Allergies   Review of Systems: All systems reviewed and negative except where noted in HPI.     Physical Exam:    Wt Readings from Last 3 Encounters:  06/10/20 272  lb 6 oz (123.5 kg)  04/18/20 268 lb 12.8 oz (121.9 kg)  04/11/20 (!) 268 lb 9.6 oz (121.8 kg)    BP 116/80   Pulse 77   Ht 6\' 3"  (1.905 m)   Wt 272 lb 6 oz (123.5 kg)   BMI 34.04 kg/m  Constitutional:  Pleasant, in no acute distress. Psychiatric: Normal mood and affect. Behavior is normal. EENT: Pupils normal.  Conjunctivae are normal. No scleral icterus. Neck supple. No cervical LAD. Cardiovascular: Normal rate, regular rhythm. No edema Pulmonary/chest: Effort normal and breath sounds normal. No wheezing, rales or rhonchi. Abdominal: Soft, nondistended, nontender. Bowel sounds active throughout. There are no masses palpable. No hepatomegaly. Neurological: Alert and oriented to person place and time. Skin: Skin is warm and dry. No rashes noted. Rectal: Exam deferred by patient to time of colonoscopy.    ASSESSMENT AND PLAN;   1) Colon cancer screening -Schedule  colonoscopy for routine CRC screening  2) History of hemorrhoids 3) Recent hematochezia 4) Rectal pain -Symptoms have now essentially resolved.  History of hemorrhoidectomy x2.  Evaluate further at the time of colonoscopy.  If any clinically significant internal hemorrhoidal disease, can discuss hemorrhoid banding vs colorectal surgery referral  The indications, risks, and benefits of colonoscopy were explained to the patient in detail. Risks include but are not limited to bleeding, perforation, adverse reaction to medications, and cardiopulmonary compromise. Sequelae include but are not limited to the possibility of surgery, hospitalization, and mortality. The patient verbalized understanding and wished to proceed. All questions answered, referred to the scheduler and bowel prep ordered. Further recommendations pending results of the exam.     , DO, FACG  06/10/2020, 4:04 PM   [place], 06/12/2020, PA-C

## 2020-06-15 ENCOUNTER — Other Ambulatory Visit: Payer: Self-pay | Admitting: Medical

## 2020-06-15 NOTE — Telephone Encounter (Signed)
Please advise 

## 2020-06-16 ENCOUNTER — Other Ambulatory Visit: Payer: Self-pay | Admitting: Medical

## 2020-06-16 NOTE — Telephone Encounter (Signed)
Rx fenofbrate sent to pt pharmacy. On review hx of triglclyerides of 400 in in the past.

## 2020-06-22 ENCOUNTER — Other Ambulatory Visit: Payer: Self-pay | Admitting: Medical

## 2020-06-25 ENCOUNTER — Telehealth: Payer: Self-pay | Admitting: Medical

## 2020-06-25 NOTE — Telephone Encounter (Signed)
CallerOrlando Nichols Call Back # [phone]   Patient called in regards to some medication questions and is requesting a call back at the number listed above by a clinical staff member. Please Advise  Thank You

## 2020-06-25 NOTE — Telephone Encounter (Signed)
Patient states he heard on the TV that he should stop taking low dose aspirin due to certain side effects , wants to make sure its ok to take

## 2020-06-25 NOTE — Telephone Encounter (Signed)
Advise on aspirin dose change. I reviewed most recent guidance and the details make mention of individual decision for each case persons in his age.   In his age group with cardiovascular risk score above 10% and low risk of bleeding then reasonable to use 81 mg aspirin. His calculated risk score is 11.1%.  If he comfortable using can continue.  If guidance modified might need to stop in future as advise does change as new evidence found.  If he started aspirin based on advise given by his cardiologist then double check with cardiologist when you see cardiologist.

## 2020-06-26 ENCOUNTER — Encounter: Payer: Self-pay | Admitting: Gastroenterology

## 2020-06-26 NOTE — Telephone Encounter (Signed)
Pt called and unable to lvm 

## 2020-06-26 NOTE — Telephone Encounter (Signed)
CallerSaajan Nichols Call Back # [phone]   Pt called back and said he will be available around 1230p to speak to someone then   Thank you

## 2020-06-26 NOTE — Telephone Encounter (Signed)
Pt.notified

## 2020-07-09 ENCOUNTER — Ambulatory Visit (AMBULATORY_SURGERY_CENTER): Payer: 59 | Admitting: Gastroenterology

## 2020-07-09 ENCOUNTER — Other Ambulatory Visit: Payer: Self-pay

## 2020-07-09 ENCOUNTER — Encounter: Payer: Self-pay | Admitting: Gastroenterology

## 2020-07-09 VITALS — BP 141/92 | HR 47 | Temp 97.3°F | Resp 12 | Ht 75.0 in | Wt 272.0 lb

## 2020-07-09 DIAGNOSIS — Z1211 Encounter for screening for malignant neoplasm of colon: Secondary | ICD-10-CM

## 2020-07-09 DIAGNOSIS — K641 Second degree hemorrhoids: Secondary | ICD-10-CM

## 2020-07-09 DIAGNOSIS — K5730 Diverticulosis of large intestine without perforation or abscess without bleeding: Secondary | ICD-10-CM

## 2020-07-09 DIAGNOSIS — K602 Anal fissure, unspecified: Secondary | ICD-10-CM

## 2020-07-09 MED ORDER — SODIUM CHLORIDE 0.9 % IV SOLN: 500.0000 mL | Freq: Once | INTRAVENOUS | Status: DC

## 2020-07-09 MED ORDER — AMBULATORY NON FORMULARY MEDICATION: 0 refills | Status: AC

## 2020-07-09 NOTE — Progress Notes (Signed)
A/ox3, pleased with MAC, report to RN 

## 2020-07-09 NOTE — Op Note (Signed)
[facility] Patient Name: Walter Nichols Procedure Date: 07/09/2020 10:57 AM MRN: [id number] Endoscopist: Doristine LocksVito Javaris Wigington , MD Age: [age] Referring MD:  Date of Birth: 08/17/1971 Gender: Male Account #: [id number] Procedure:                Colonoscopy Indications:              Screening for colorectal malignant neoplasm, This                            is the patient's first colonoscopy                           Additionally, he has a history of hemorrhoids s/p                            hemorrhoid surgery x2 in the past, but more                            recently recurrence of perianal symptoms. Medicines:                Monitored Anesthesia Care Procedure:                Pre-Anesthesia Assessment:                           - Prior to the procedure, a History and Physical                            was performed, and patient medications and                            allergies were reviewed. The patient's tolerance of                            previous anesthesia was also reviewed. The risks                            and benefits of the procedure and the sedation                            options and risks were discussed with the patient.                            All questions were answered, and informed consent                            was obtained. Prior Anticoagulants: The patient has                            taken no previous anticoagulant or antiplatelet                            agents. ASA Grade Assessment: II - A patient with  mild systemic disease. After reviewing the risks                            and benefits, the patient was deemed in                            satisfactory condition to undergo the procedure.                           After obtaining informed consent, the colonoscope                            was passed under direct vision. Throughout the                            procedure, the patient's blood pressure,  pulse, and                            oxygen saturations were monitored continuously. The                            Colonoscope was introduced through the anus and                            advanced to the the terminal ileum. The colonoscopy                            was performed without difficulty. The patient                            tolerated the procedure well. The quality of the                            bowel preparation was good. The terminal ileum,                            ileocecal valve, appendiceal orifice, and rectum                            were photographed. Scope In: 11:05:10 AM Scope Out: 11:19:58 AM Scope Withdrawal Time: 0 hours 11 minutes 3 seconds  Total Procedure Duration: 0 hours 14 minutes 48 seconds  Findings:                 An anal fissure and hemorrhoids were found on                            perianal exam.                           A few small-mouthed diverticula were found in the                            sigmoid colon.  The exam was otherwise normal throughout the                            remainder of the colon.                           Non-bleeding internal hemorrhoids were found during                            retroflexion. The hemorrhoids were medium-sized and                            Grade II (internal hemorrhoids that prolapse but                            reduce spontaneously).                           A scar was found in the rectum consistent with                            prior hemorrhoid surgery. The scar tissue was                            healthy in appearance.                           The terminal ileum appeared normal. Complications:            No immediate complications. Estimated Blood Loss:     Estimated blood loss: none. Impression:               - Anal fissure found on perianal exam.                           - Diverticulosis in the sigmoid colon.                           - Non-bleeding  internal hemorrhoids.                           - Scar in the rectum.                           - The examined portion of the ileum was normal.                           - No specimens collected. Recommendation:           - Patient has a contact number available for                            emergencies. The signs and symptoms of potential                            delayed complications were discussed with the  patient. Return to normal activities tomorrow.                            Written discharge instructions were provided to the                            patient.                           - Resume previous diet.                           - Continue present medications.                           - Repeat colonoscopy in 10 years for screening                            purposes.                           - Return to GI office PRN.                           - Use fiber, for example Citrucel, Fibercon, Konsyl                            or Metamucil.                           - Start topical Nitroglycerine 0.125% (compunding                            pharmacy). Apply a pea-sized amount to the affected                            area twice daily for 6-8 weeks. Avoid strenuous                            activity/exercise within 30 minutes of application.                           - Internal hemorrhoids were noted on this study and                            may be amenable to hemorrhoid band ligation. If you                            are interested in further treatment of these                            hemorrhoids with band ligation, please contact my                            clinic to set up an appointment for evaluation and  treatment. Doristine Locks, MD 07/09/2020 11:26:58 AM

## 2020-07-09 NOTE — Patient Instructions (Signed)
YOU HAD AN ENDOSCOPIC PROCEDURE TODAY AT THE [facility]:   Refer to the procedure report that was given to you for any specific questions about what was found during the examination.  If the procedure report does not answer your questions, please call your gastroenterologist to clarify.  If you requested that your care partner not be given the details of your procedure findings, then the procedure report has been included in a sealed envelope for you to review at your convenience later.  YOU SHOULD EXPECT: Some feelings of bloating in the abdomen. Passage of more gas than usual.  Walking can help get rid of the air that was put into your GI tract during the procedure and reduce the bloating. If you had a lower endoscopy (such as a colonoscopy or flexible sigmoidoscopy) you may notice spotting of blood in your stool or on the toilet paper. If you underwent a bowel prep for your procedure, you may not have a normal bowel movement for a few days.  Please Note:  You might notice some irritation and congestion in your nose or some drainage.  This is from the oxygen used during your procedure.  There is no need for concern and it should clear up in a day or so.  SYMPTOMS TO REPORT IMMEDIATELY:   Following lower endoscopy (colonoscopy or flexible sigmoidoscopy):  Excessive amounts of blood in the stool  Significant tenderness or worsening of abdominal pains  Swelling of the abdomen that is new, acute  Fever of 100F or higher  For urgent or emergent issues, a gastroenterologist can be reached at any hour by calling [phone]. Do not use MyChart messaging for urgent concerns.    DIET:  We do recommend a small meal at first, but then you may proceed to your regular diet.  Drink plenty of fluids but you should avoid alcoholic beverages for 24 hours.  ACTIVITY:  You should plan to take it easy for the rest of today and you should NOT DRIVE or use heavy machinery until tomorrow (because  of the sedation medicines used during the test).    FOLLOW UP: Our staff will call the number listed on your records 48-72 hours following your procedure to check on you and address any questions or concerns that you may have regarding the information given to you following your procedure. If we do not reach you, we will leave a message.  We will attempt to reach you two times.  During this call, we will ask if you have developed any symptoms of COVID 19. If you develop any symptoms (ie: fever, flu-like symptoms, shortness of breath, cough etc.) before then, please call [phone].  If you test positive for Covid 19 in the 2 weeks post procedure, please call and report this information to [place].    SIGNATURES/CONFIDENTIALITY: You and/or your care partner have signed paperwork which will be entered into your electronic medical record.  These signatures attest to the fact that that the information above on your After Visit Summary has been reviewed and is understood.  Full responsibility of the confidentiality of this discharge information lies with you and/or your care-partner.   You should apply a pea size amount to your rectum twice daily for 6-8 weeks. Please take your printed prescription to [facility].  [facility]'s information is below:  Address: [address] [place], [place] [address]  [phone]   AVOID STRENUOUS ACTIVITY/EXERCISE WITHIN 30 MINUTES OF APPLYING NITRO OINTMENT!!!!  Next colonoscopy- 10 years  Please read over handouts about diverticulosis, hemorrhoids and hemorrhoidal banding  Continue your normal medication  Follow up with Dr. Barron Alvine in the office as needed

## 2020-07-09 NOTE — Progress Notes (Signed)
VS taken by JD 

## 2020-07-11 ENCOUNTER — Telehealth: Payer: Self-pay

## 2020-07-11 NOTE — Telephone Encounter (Signed)
  Follow up Call-  Call back number 07/09/2020  Post procedure Call Back phone  # [phone]  Permission to leave phone message Yes  Some recent data might be hidden     Patient questions:  Do you have a fever, pain , or abdominal swelling? No. Pain Score  0 *  Have you tolerated food without any problems? Yes.    Have you been able to return to your normal activities? Yes.    Do you have any questions about your discharge instructions: Diet   No. Medications  No. Follow up visit  No.  Do you have questions or concerns about your Care? No.  Actions: * If pain score is 4 or above: No action needed, pain <4. 1. Have you developed a fever since your procedure? no  2.   Have you had an respiratory symptoms (SOB or cough) since your procedure? no  3.   Have you tested positive for COVID 19 since your procedure no  4.   Have you had any family members/close contacts diagnosed with the COVID 19 since your procedure?  no   If yes to any of these questions please route to Laverna Peace, RN and Karlton Lemon, RN

## 2020-07-24 ENCOUNTER — Telehealth: Payer: Self-pay | Admitting: Medical

## 2020-07-24 MED ORDER — SILDENAFIL CITRATE 50 MG PO TABS: ORAL_TABLET | ORAL | 0 refills | Status: DC

## 2020-07-24 NOTE — Telephone Encounter (Signed)
Rx sent 

## 2020-07-24 NOTE — Telephone Encounter (Signed)
Medication: sildenafil (VIAGRA) 50 MG tablet   Has the patient contacted their pharmacy? No. (If no, request that the patient contact the pharmacy for the refill.) (If yes, when and what did the pharmacy advise?)  Preferred Pharmacy (with phone number or street name): [id number] [place], [place] - [address]  [address] [place] [address]  Phone:  [phone] Fax:  [phone]  DEA #:  [id number]  Agent: Please be advised that RX refills may take up to 3 business days. We ask that you follow-up with your pharmacy.

## 2020-08-22 ENCOUNTER — Telehealth: Payer: Self-pay | Admitting: Medical

## 2020-08-22 MED ORDER — SILDENAFIL CITRATE 50 MG PO TABS
ORAL_TABLET | ORAL | 0 refills | Status: DC
Start: 2020-08-22 — End: 2020-09-25

## 2020-08-22 NOTE — Telephone Encounter (Signed)
Medication: sildenafil (VIAGRA) 50 MG tablet [[id number]]       Has the patient contacted their pharmacy? (If no, request that the patient contact the pharmacy for the refill.) (If yes, when and what did the pharmacy advise?)     Preferred Pharmacy (with phone number or street name):  [id number] [place], [place] - [address]  [address] [place] [address]  Phone:  [phone] Fax:  [phone]     Agent: Please be advised that RX refills may take up to 3 business days. We ask that you follow-up with your pharmacy.

## 2020-08-22 NOTE — Telephone Encounter (Signed)
Rx sent 

## 2020-08-27 ENCOUNTER — Telehealth: Payer: Self-pay | Admitting: Medical

## 2020-08-27 NOTE — Telephone Encounter (Signed)
Schedule virtual visit tomorrow or Friday. Need to discuss his medical history. Been a while since seen him.Then decide on writing letter.  Need to review since often [employer] will start sending over official forms/fmla etc. So need follow up.

## 2020-08-27 NOTE — Telephone Encounter (Signed)
Appt made for Friday  °

## 2020-08-27 NOTE — Telephone Encounter (Signed)
Patient states he needs an extension to remind at home to work. Patient states that he has several medication concerns that prevent him from wanting to return to the office. Patient is requesting a note sent to his employer to extend his time to work form home.  Employer : [employer]  Fax Number  [phone] email :  [email or web address]

## 2020-08-29 ENCOUNTER — Telehealth (INDEPENDENT_AMBULATORY_CARE_PROVIDER_SITE_OTHER): Payer: 59 | Admitting: Medical

## 2020-08-29 ENCOUNTER — Other Ambulatory Visit: Payer: Self-pay

## 2020-08-29 DIAGNOSIS — E785 Hyperlipidemia, unspecified: Secondary | ICD-10-CM

## 2020-08-29 DIAGNOSIS — I10 Essential (primary) hypertension: Secondary | ICD-10-CM | POA: Diagnosis not present

## 2020-08-29 DIAGNOSIS — E669 Obesity, unspecified: Secondary | ICD-10-CM | POA: Diagnosis not present

## 2020-08-29 DIAGNOSIS — F419 Anxiety disorder, unspecified: Secondary | ICD-10-CM

## 2020-08-29 DIAGNOSIS — Z638 Other specified problems related to primary support group: Secondary | ICD-10-CM

## 2020-08-29 NOTE — Progress Notes (Signed)
° °  Subjective:    Patient ID: Walter Nichols, male    DOB: 1970/12/24, 49 y.o.   MRN: [id number]  HPI  Virtual Visit via Video Note  I connected with Eilam Shrewsbury on 08/29/20 at  8:20 AM EST by a video enabled telemedicine application and verified that I am speaking with the correct person using two identifiers.  Location: Patient: home Provider:home   I discussed the limitations of evaluation and management by telemedicine and the availability of in person appointments. The patient expressed understanding and agreed to proceed.  History of Present Illness:   Pt states he has medical accomadation for me to fill. Company wants him to return to work/in office until January. He wants to return to work in April, 2022.  He is working from home. He also has extenuating single parenting situation where working from home would be beneficial.  Observations/Objective:  Pt has htn, hyperlipidemia, obestiy and anxiety.  Pt has not checked his blood pressure today. He needs to have physical.   Pt has high cholesterol. Lipitor and fenofibrate.  Pt is obese. He has not been working out.  Pt anxiety is controlled. But states feels normal life stress.  Daughter now living with mother in [place].   Assessment and Plan: For htn continue amlodipine. Recommend check bp at home. Want to see bp level less than 140/90. Better to be closer to 130/80.  For high cholesterol continue atorvastatin.   For obesity recommend healthy diet and regular exercise.  For anxiety continue buspar.   Will write letter of accomadation asking work to allow you to work from home until the end of March and April.  Follow within 2-3 weeks for welness exam. Recommend early appointment and to come in fasting.  Fax [phone].   Follow Up Instructions:    I discussed the assessment and treatment plan with the patient. The patient was provided an opportunity to ask questions and all were answered. The patient  agreed with the plan and demonstrated an understanding of the instructions.   The patient was advised to call back or seek an in-person evaluation if the symptoms worsen or if the condition fails to improve as anticipated.  Time spent with patient today was 30  minutes which consisted of chart revdiew, discussing diagnosis, work up treatment and documentation.   Esperanza Richters, PA-C   Review of Systems  Constitutional: Negative for chills, fatigue and fever.  Respiratory: Negative for chest tightness, shortness of breath and wheezing.   Cardiovascular: Negative for chest pain and palpitations.  Gastrointestinal: Negative for abdominal pain, blood in stool, diarrhea, nausea and vomiting.  Musculoskeletal: Negative for back pain.  Skin: Negative for rash.  Neurological: Negative for dizziness, seizures, speech difficulty, weakness and light-headedness.  Hematological: Negative for adenopathy. Does not bruise/bleed easily.  Psychiatric/Behavioral: Negative for behavioral problems, dysphoric mood and sleep disturbance. The patient is not nervous/anxious.        Stress. But anxiety seems controlled.       Objective:   Physical Exam        Assessment & Plan:

## 2020-08-29 NOTE — Patient Instructions (Addendum)
For htn continue amlodipine. Recommend check bp at home. Want to see bp level less than 140/90. Better to be closer to 130/80.  For high cholesterol continue atorvastatin.   For obesity recommend healthy diet and regular exercise.  For anxiety continue buspar. Do recommend you attend counseling.  Will write letter of accomadation asking work to allow you to work from home until the end of March and April.  Follow within 2-3 weeks for welness exam. Recommend early appointment and to come in fasting.

## 2020-09-02 NOTE — Telephone Encounter (Signed)
I did call back Walter Nichols and left a message for her to call back.  Prior to calling her back I did touch base with Walter Nichols to discuss his reasons for wanting to continue virtual work from home.  He has concerned about his Covid risk score, some anxiety with presently controlled and is a single parent.  He has concerns about omicron variant and recent uptake in infections.  Also we discussed that he does not have help with his children presently.  He thinks his mother is probably reluctant to help out and he does not really want to ask her in light of the Covid situation.

## 2020-09-02 NOTE — Telephone Encounter (Signed)
Russella Dar of [place]  Call Back # [phone]  Sandi Raveling called in reference to not received in reference to patient working form home, per [place] she has a few questions in reference to the note, she would like a call back.

## 2020-09-25 ENCOUNTER — Telehealth: Payer: Self-pay | Admitting: Medical

## 2020-09-25 MED ORDER — SILDENAFIL CITRATE 50 MG PO TABS
ORAL_TABLET | ORAL | 0 refills | Status: DC
Start: 2020-09-25 — End: 2020-10-14

## 2020-09-25 NOTE — Telephone Encounter (Signed)
Rx sent 

## 2020-09-25 NOTE — Telephone Encounter (Signed)
Medication:  sildenafil (VIAGRA) 50 MG tablet [[id number]]      Has the patient contacted their pharmacy?  (If no, request that the patient contact the pharmacy for the refill.) (If yes, when and what did the pharmacy advise?)     Preferred Pharmacy (with phone number or street name):  [id number] [place], [place] - [address]  [address] [place] [address]  Phone:  [phone] Fax:  [phone]     Agent: Please be advised that RX refills may take up to 3 business days. We ask that you follow-up with your pharmacy.

## 2020-10-13 ENCOUNTER — Other Ambulatory Visit: Payer: Self-pay

## 2020-10-14 ENCOUNTER — Encounter: Payer: Self-pay | Admitting: Medical

## 2020-10-14 ENCOUNTER — Other Ambulatory Visit (HOSPITAL_COMMUNITY)
Admission: RE | Admit: 2020-10-14 | Discharge: 2020-10-14 | Disposition: A | Discharge status: Home / Self Care | Payer: 59 | Source: Ambulatory Visit | Attending: Medical | Admitting: Medical

## 2020-10-14 ENCOUNTER — Ambulatory Visit (INDEPENDENT_AMBULATORY_CARE_PROVIDER_SITE_OTHER): Payer: 59 | Admitting: Medical

## 2020-10-14 VITALS — BP 130/80 | HR 71 | Resp 18 | Ht 75.0 in | Wt 279.0 lb

## 2020-10-14 DIAGNOSIS — E785 Hyperlipidemia, unspecified: Secondary | ICD-10-CM

## 2020-10-14 DIAGNOSIS — Z113 Encounter for screening for infections with a predominantly sexual mode of transmission: Secondary | ICD-10-CM | POA: Insufficient documentation

## 2020-10-14 DIAGNOSIS — I10 Essential (primary) hypertension: Secondary | ICD-10-CM

## 2020-10-14 DIAGNOSIS — R739 Hyperglycemia, unspecified: Secondary | ICD-10-CM

## 2020-10-14 DIAGNOSIS — Z125 Encounter for screening for malignant neoplasm of prostate: Secondary | ICD-10-CM

## 2020-10-14 DIAGNOSIS — F17200 Nicotine dependence, unspecified, uncomplicated: Secondary | ICD-10-CM | POA: Diagnosis not present

## 2020-10-14 DIAGNOSIS — Z0000 Encounter for general adult medical examination without abnormal findings: Secondary | ICD-10-CM

## 2020-10-14 DIAGNOSIS — N529 Male erectile dysfunction, unspecified: Secondary | ICD-10-CM

## 2020-10-14 DIAGNOSIS — F419 Anxiety disorder, unspecified: Secondary | ICD-10-CM

## 2020-10-14 LAB — COMPREHENSIVE METABOLIC PANEL
ALT: 44 U/L (ref 0–53)
AST: 38 U/L — ABNORMAL HIGH (ref 0–37)
Albumin: 4.6 g/dL (ref 3.5–5.2)
Alkaline Phosphatase: 79 U/L (ref 39–117)
BUN: 12 mg/dL (ref 6–23)
CO2: 24 mEq/L (ref 19–32)
Calcium: 10.2 mg/dL (ref 8.4–10.5)
Chloride: 107 mEq/L (ref 96–112)
Creatinine, Ser: 1.19 mg/dL (ref 0.40–1.50)
GFR: 72.37 mL/min (ref >60.00)
Glucose, Bld: 106 mg/dL — ABNORMAL HIGH (ref 70–99)
Potassium: 4.4 mEq/L (ref 3.5–5.1)
Sodium: 136 mEq/L (ref 135–145)
Total Bilirubin: 0.5 mg/dL (ref 0.2–1.2)
Total Protein: 7.6 g/dL (ref 6.0–8.3)

## 2020-10-14 LAB — CBC WITH DIFFERENTIAL/PLATELET
Basophils Absolute: 0.1 10*3/uL (ref 0.0–0.1)
Basophils Relative: 1 % (ref 0.0–3.0)
Eosinophils Absolute: 0.2 10*3/uL (ref 0.0–0.7)
Eosinophils Relative: 3.7 % (ref 0.0–5.0)
HCT: 45.3 % (ref 39.0–52.0)
Hemoglobin: 15.1 g/dL (ref 13.0–17.0)
Lymphocytes Relative: 38.3 % (ref 12.0–46.0)
Lymphs Abs: 2.2 10*3/uL (ref 0.7–4.0)
MCHC: 33.4 g/dL (ref 30.0–36.0)
MCV: 86.3 fl (ref 78.0–100.0)
Monocytes Absolute: 0.5 10*3/uL (ref 0.1–1.0)
Monocytes Relative: 9.1 % (ref 3.0–12.0)
Neutro Abs: 2.7 10*3/uL (ref 1.4–7.7)
Neutrophils Relative %: 47.9 % (ref 43.0–77.0)
Platelets: 330 10*3/uL (ref 150.0–400.0)
RBC: 5.25 Mil/uL (ref 4.22–5.81)
RDW: 14.1 % (ref 11.5–15.5)
WBC: 5.7 10*3/uL (ref 4.0–10.5)

## 2020-10-14 LAB — LIPID PANEL
Cholesterol: 202 mg/dL — ABNORMAL HIGH (ref 0–200)
HDL: 33.9 mg/dL — ABNORMAL LOW (ref >39.00)
NonHDL: 168.53
Total CHOL/HDL Ratio: 6
Triglycerides: 275 mg/dL — ABNORMAL HIGH (ref 0.0–149.0)
VLDL: 55 mg/dL — ABNORMAL HIGH (ref 0.0–40.0)

## 2020-10-14 LAB — LDL CHOLESTEROL, DIRECT: Direct LDL: 121 mg/dL

## 2020-10-14 LAB — HEMOGLOBIN A1C: Hgb A1c MFr Bld: 6 % (ref 4.6–6.5)

## 2020-10-14 LAB — PSA: PSA: 3.89 ng/mL (ref 0.10–4.00)

## 2020-10-14 MED ORDER — SILDENAFIL CITRATE 50 MG PO TABS: ORAL_TABLET | ORAL | 0 refills | Status: AC

## 2020-10-14 NOTE — Progress Notes (Signed)
Subjective:    Patient ID: Walter Nichols, male    DOB: 30-Jul-1971, 50 y.o.   MRN: [id number]  HPI Pt in for cpe/wellness exam  Working at [employer]/remote work presently. Not exercising much, Diet is moderate healthy. No drug use. He does smoking about pack a day. Alcohol use 1-2 beer on weekends. Girlfriend does drink.    Pt anxiety controlled with buspar. Pt discusses his mood and struggles with his daughter having to move to mother who lives in [place].  Has high cholesterol. Pt is on atorvastatin 10 mg daily.  Hx of htn. Pt is on amlodipine 10 mg daily.  ED history. Will rx viagra 50 mg daily.    The 10-year ASCVD risk score Denman George DC Montez Hageman., et al., 2013) is: 14.3%   Values used to calculate the score:     Age: 67 years     Sex: Male     Is Non-Hispanic African American: Yes     Diabetic: No     Tobacco smoker: Yes     Systolic Blood Pressure: 130 mmHg     Is BP treated: Yes     HDL Cholesterol: 32 mg/dL     Total Cholesterol: 147 mg/dL    Review of Systems  Constitutional: Negative for chills, fatigue and fever.  HENT: Negative for congestion, drooling, ear pain, hearing loss, rhinorrhea, sinus pressure and sinus pain.   Respiratory: Negative for cough, chest tightness, shortness of breath and wheezing.   Cardiovascular: Negative for chest pain and palpitations.  Gastrointestinal: Negative for abdominal distention, abdominal pain, anal bleeding and blood in stool.  Genitourinary: Negative for dysuria, flank pain and frequency.  Musculoskeletal: Negative for back pain and myalgias.  Skin: Negative for rash.  Neurological: Negative for dizziness, seizures, speech difficulty, numbness and headaches.  Hematological: Negative for adenopathy. Does not bruise/bleed easily.  Psychiatric/Behavioral: Negative for behavioral problems, dysphoric mood, self-injury, sleep disturbance and suicidal ideas. The patient is nervous/anxious.     Past Medical History:  Diagnosis  Date  . Alcoholism (HCC)   . Anal fissure   . Anxiety   . Depression   . Hypertension   . Migraine      Social History   Socioeconomic History  . Marital status: Single    Spouse name: Not on file  . Number of children: Not on file  . Years of education: Not on file  . Highest education level: Not on file  Occupational History  . Not on file  Tobacco Use  . Smoking status: Current Every Day Smoker    Packs/day: 0.50    Types: Cigarettes  . Smokeless tobacco: Never Used  Vaping Use  . Vaping Use: Never used  Substance and Sexual Activity  . Alcohol use: Not Currently    Comment: 40 oz a day/ocassionally   . Drug use: No    Comment: in the past used coccaine. 3 yrs ago.  Marland Kitchen Sexual activity: Never  Other Topics Concern  . Not on file  Social History Narrative  . Not on file   Social Determinants of Health   Financial Resource Strain: Not on file  Food Insecurity: Not on file  Transportation Needs: Not on file  Physical Activity: Not on file  Stress: Not on file  Social Connections: Not on file  Intimate Partner Violence: Not on file    Past Surgical History:  Procedure Laterality Date  . HEMORROIDECTOMY     x2    Family History  Problem  Relation Age of Onset  . Lung cancer Maternal Grandmother   . Colon cancer Neg Hx   . Esophageal cancer Neg Hx   . Rectal cancer Neg Hx   . Stomach cancer Neg Hx     No Known Allergies  Current Outpatient Medications on File Prior to Visit  Medication Sig Dispense Refill  . AMBULATORY NON FORMULARY MEDICATION Nitroglycerin ointment 0.125% apply BID rectally for 6-8 weeks 30 g 0  . amLODipine (NORVASC) 10 MG tablet TAKE 1 TABLET BY MOUTH EVERY DAY 90 tablet 3  . ASPIRIN 81 PO Take 1 tablet by mouth daily.    Marland Kitchen atorvastatin (LIPITOR) 10 MG tablet TAKE 1 TABLET BY MOUTH EVERY DAY 90 tablet 1  . busPIRone (BUSPAR) 15 MG tablet TAKE 1 TABLET (15 MG TOTAL) BY MOUTH 2 (TWO) TIMES DAILY. 60 tablet 0  . Cyanocobalamin (BL  VITAMIN B-12 PO) Take 1 tablet by mouth daily.    . fenofibrate (TRICOR) 48 MG tablet TAKE 1 TABLET BY MOUTH EVERY DAY 90 tablet 1  . Multiple Vitamins-Minerals (MULTIVITAMIN WITH MINERALS) tablet Take 1 tablet by mouth as needed (3 times a week. 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     . sildenafil (VIAGRA) 50 MG tablet 1 tab po as needed one hour prior to sex 10 tablet 0  . traMADol (ULTRAM) 50 MG tablet 1 tab po q 6 hours prn pain 3 tablet 0  . triamcinolone (NASACORT) 55 MCG/ACT AERO nasal inhaler as needed.    Marland Kitchen VITAMIN A PO Take 1 tablet by mouth daily.    Marland Kitchen VITAMIN D PO Take 1 tablet by mouth daily.    Marland Kitchen VITAMIN E PO Take 1 tablet by mouth daily.     No current facility-administered medications on file prior to visit.    BP (!) 139/92   Pulse 71   Resp 18   Ht 6\' 3"  (1.905 m)   Wt 279 lb (126.6 kg)   SpO2 100%   BMI 34.87 kg/m       Objective:   Physical Exam  General Mental Status- Alert. General Appearance- Not in acute distress.   Skin General: Color- Normal Color. Moisture- Normal Moisture.  Neck Carotid Arteries- Normal color. Moisture- Normal Moisture. No carotid bruits. No JVD.  Chest and Lung Exam Auscultation: Breath Sounds:-Normal.  Cardiovascular Auscultation:Rythm- Regular. Murmurs & Other Heart Sounds:Auscultation of the heart reveals- No Murmurs.  Abdomen Inspection:-Inspeection Normal. Palpation/Percussion:Note:No mass. Palpation and Percussion of the abdomen reveal- Non Tender, Non Distended + BS, no rebound or guarding.    Neurologic Cranial Nerve exam:- CN III-XII intact(No nystagmus), symmetric smile. Strength:- 5/5 equal and symmetric strength both upper and lower extremities.      Assessment & Plan:  For you wellness exam today I have ordered cbc, cmp and lipid panel.  Vaccine up to date.  Recommend exercise and healthy diet.  We will let you know lab results as they come in.  Follow up date appointment will be determined after lab  review.   For anxiety continue buspar.  For htn continue amlodipine.  Hx of smoking. Recommend you stop completely.   For high cholesterol continue atorvastatin.   Std screening get hiv screen and urine ancillary test.  For ED refilled viagra.  , Esperanza Richters   [place] charge as well. Addressed anxiety, ED, htn and high cholesterol

## 2020-10-14 NOTE — Addendum Note (Signed)
Addended by: Gwenevere Abbot on: 10/14/2020 10:51 AM   Modules accepted: Orders

## 2020-10-14 NOTE — Patient Instructions (Addendum)
For you wellness exam today I have ordered cbc, cmp and lipid panel.  Vaccine up to date.  Recommend exercise and healthy diet.  We will let you know lab results as they come in.  Follow up date appointment will be determined after lab review.   For anxiety continue buspar.  For htn continue amlodipine.  Hx of smoking. Recommend you stop completely.   For high cholesterol continue atorvastatin.   Std screening get hiv screen and urine ancillary test.  For ED refilled viagra.   Preventive Care 49-51 Years Old, Male Preventive care refers to lifestyle choices and visits with your health care provider that can promote health and wellness. This includes:  A yearly physical exam. This is also called an annual wellness visit.  Regular dental and eye exams.  Immunizations.  Screening for certain conditions.  Healthy lifestyle choices, such as: ? Eating a healthy diet. ? Getting regular exercise. ? Not using drugs or products that contain nicotine and tobacco. ? Limiting alcohol use. What can I expect for my preventive care visit? Physical exam Your health care provider will check your:  Height and weight. These may be used to calculate your BMI (body mass index). BMI is a measurement that tells if you are at a healthy weight.  Heart rate and blood pressure.  Body temperature.  Skin for abnormal spots. Counseling Your health care provider may ask you questions about your:  Past medical problems.  Family's medical history.  Alcohol, tobacco, and drug use.  Emotional well-being.  Home life and relationship well-being.  Sexual activity.  Diet, exercise, and sleep habits.  Work and work Astronomer.  Access to firearms. What immunizations do I need? Vaccines are usually given at various ages, according to a schedule. Your health care provider will recommend vaccines for you based on your age, medical history, and lifestyle or other factors, such as travel or  where you work.   What tests do I need? Blood tests  Lipid and cholesterol levels. These may be checked every 5 years, or more often if you are over 49 years old.  Hepatitis C test.  Hepatitis B test. Screening  Lung cancer screening. You may have this screening every year starting at age 49 if you have a 30-pack-year history of smoking and currently smoke or have quit within the past 15 years.  Prostate cancer screening. Recommendations will vary depending on your family history and other risks.  Genital exam to check for testicular cancer or hernias.  Colorectal cancer screening. ? All adults should have this screening starting at age 49 and continuing until age 29. ? Your health care provider may recommend screening at age 7 if you are at increased risk. ? You will have tests every 1-10 years, depending on your results and the type of screening test.  Diabetes screening. ? This is done by checking your blood sugar (glucose) after you have not eaten for a while (fasting). ? You may have this done every 1-3 years.  STD (sexually transmitted disease) testing, if you are at risk. Follow these instructions at home: Eating and drinking  Eat a diet that includes fresh fruits and vegetables, whole grains, lean protein, and low-fat dairy products.  Take vitamin and mineral supplements as recommended by your health care provider.  Do not drink alcohol if your health care provider tells you not to drink.  If you drink alcohol: ? Limit how much you have to 0-2 drinks a day. ? Be aware of  how much alcohol is in your drink. In the [place] one drink equals one 12 oz bottle of beer (355 mL), one 5 oz glass of wine (148 mL), or one 1 oz glass of hard liquor (44 mL).   Lifestyle  Take daily care of your teeth and gums. Brush your teeth every morning and night with fluoride toothpaste. Floss one time each day.  Stay active. Exercise for at least 30 minutes 5 or more days each week.  Do  not use any products that contain nicotine or tobacco, such as cigarettes, e-cigarettes, and chewing tobacco. If you need help quitting, ask your health care provider.  Do not use drugs.  If you are sexually active, practice safe sex. Use a condom or other form of protection to prevent STIs (sexually transmitted infections).  If told by your health care provider, take low-dose aspirin daily starting at age 49.  Find healthy ways to cope with stress, such as: ? Meditation, yoga, or listening to music. ? Journaling. ? Talking to a trusted person. ? Spending time with friends and family. Safety  Always wear your seat belt while driving or riding in a vehicle.  Do not drive: ? If you have been drinking alcohol. Do not ride with someone who has been drinking. ? When you are tired or distracted. ? While texting.  Wear a helmet and other protective equipment during sports activities.  If you have firearms in your house, make sure you follow all gun safety procedures. What's next?  Go to your health care provider once a year for an annual wellness visit.  Ask your health care provider how often you should have your eyes and teeth checked.  Stay up to date on all vaccines. This information is not intended to replace advice given to you by your health care provider. Make sure you discuss any questions you have with your health care provider. Document Revised: 05/29/2019 Document Reviewed: 08/24/2018 Elsevier Patient Education  2021 [employer].

## 2020-10-15 LAB — HIV ANTIBODY (ROUTINE TESTING W REFLEX): HIV 1&2 Ab, 4th Generation: NONREACTIVE

## 2020-10-15 LAB — URINE CYTOLOGY ANCILLARY ONLY
Chlamydia: NEGATIVE
Comment: NEGATIVE
Comment: NEGATIVE
Comment: NORMAL
Neisseria Gonorrhea: NEGATIVE
Trichomonas: NEGATIVE

## 2020-10-15 LAB — RPR: RPR Ser Ql: NONREACTIVE

## 2020-11-19 ENCOUNTER — Telehealth: Payer: Self-pay | Admitting: Medical

## 2020-11-19 NOTE — Telephone Encounter (Signed)
Patient is lookin for medication for his gout flare up .   Please advise

## 2020-11-20 ENCOUNTER — Telehealth: Payer: Self-pay | Admitting: Medical

## 2020-11-20 MED ORDER — PREDNISONE 10 MG (21) PO TBPK: ORAL_TABLET | ORAL | 0 refills | Status: DC

## 2020-11-20 NOTE — Telephone Encounter (Signed)
Patient stated he has been treated before by you for gout.  His pain started 2 days ago in his left great toe.  He has started the prednisone and he is feeling much better.  You do not have any openings tomorrow at all.  I have him down 11/28/20 next Friday at 1pm (he goes to lunch from 1-2).  Will this be ok since he is already feeling better?  Can the blood work be done when feeling better?

## 2020-11-20 NOTE — Telephone Encounter (Signed)
Follow up next Friday is ok. Thanks.

## 2020-11-20 NOTE — Telephone Encounter (Signed)
See other note

## 2020-11-20 NOTE — Telephone Encounter (Signed)
Also ask pt what joint is hurting.

## 2020-11-20 NOTE — Telephone Encounter (Signed)
Patient called again, requesting gout medication refill, patient is checking the status.   Patient states he is in pain.

## 2020-11-20 NOTE — Telephone Encounter (Signed)
Patient called again after hours call  Caller Name Ferrel Simington Phone Number [phone] Patient Name Walter Nichols Patient DOB 02/13/1971 Call Type Message Only Information Provided Reason for Call Medication Question / Request Initial Comment Caller states c/o gout flare up, called yesterday to request an Rx but the pharmacy hasn't received anything. Additional Comment declines triage.

## 2020-11-20 NOTE — Telephone Encounter (Signed)
Pt should make appointment typically rather than calling . I don't see uric acid on his lab review. Also did not see gout on dx list. Can you see if that is case?  Want him to be seen tomorrow. Can get uric acid. Office visit with me.  Will send in prednisone to start today.

## 2020-11-24 NOTE — Telephone Encounter (Signed)
Patient notified

## 2020-11-28 ENCOUNTER — Other Ambulatory Visit: Payer: Self-pay

## 2020-11-28 ENCOUNTER — Ambulatory Visit (INDEPENDENT_AMBULATORY_CARE_PROVIDER_SITE_OTHER): Payer: 59 | Admitting: Medical

## 2020-11-28 ENCOUNTER — Encounter: Payer: Self-pay | Admitting: Medical

## 2020-11-28 VITALS — BP 148/88 | HR 87 | Resp 18 | Ht 75.0 in | Wt 277.0 lb

## 2020-11-28 DIAGNOSIS — M79675 Pain in left toe(s): Secondary | ICD-10-CM

## 2020-11-28 DIAGNOSIS — I10 Essential (primary) hypertension: Secondary | ICD-10-CM | POA: Diagnosis not present

## 2020-11-28 DIAGNOSIS — M2012 Hallux valgus (acquired), left foot: Secondary | ICD-10-CM | POA: Diagnosis not present

## 2020-11-28 DIAGNOSIS — E785 Hyperlipidemia, unspecified: Secondary | ICD-10-CM | POA: Diagnosis not present

## 2020-11-28 LAB — COMPREHENSIVE METABOLIC PANEL
ALT: 31 U/L (ref 0–53)
AST: 20 U/L (ref 0–37)
Albumin: 4.3 g/dL (ref 3.5–5.2)
Alkaline Phosphatase: 76 U/L (ref 39–117)
BUN: 12 mg/dL (ref 6–23)
CO2: 25 mEq/L (ref 19–32)
Calcium: 9.5 mg/dL (ref 8.4–10.5)
Chloride: 107 mEq/L (ref 96–112)
Creatinine, Ser: 1.39 mg/dL (ref 0.40–1.50)
GFR: 60.01 mL/min (ref >60.00)
Glucose, Bld: 110 mg/dL — ABNORMAL HIGH (ref 70–99)
Potassium: 3.8 mEq/L (ref 3.5–5.1)
Sodium: 140 mEq/L (ref 135–145)
Total Bilirubin: 0.3 mg/dL (ref 0.2–1.2)
Total Protein: 7.4 g/dL (ref 6.0–8.3)

## 2020-11-28 LAB — URIC ACID: Uric Acid, Serum: 8.3 mg/dL — ABNORMAL HIGH (ref 4.0–7.8)

## 2020-11-28 MED ORDER — LOSARTAN POTASSIUM 25 MG PO TABS: 25.0000 mg | ORAL_TABLET | Freq: Every day | ORAL | 2 refills | Status: DC

## 2020-11-28 NOTE — Progress Notes (Signed)
Subjective:    Patient ID: Walter Nichols, male    DOB: 07-May-1971, 50 y.o.   MRN: [id number]  HPI  Pt in for follow up of left great toe. Pt states he has hx of toe pain intermittently. He sent me message and reported that he thought was gout flare. When he sent me message was about 10 days ago. I had no quick available appointments and advise to follow up so could get uric acid level.  Pt bp is mild elevated today. He is on amlodipine 10 mg daily.  Hx of high cholesterol and on atorvastatin. Also on fenofibrate.  Review of Systems  Constitutional: Negative for chills, fatigue and fever.  Respiratory: Negative for cough, chest tightness, shortness of breath and wheezing.   Cardiovascular: Negative for chest pain and palpitations.  Gastrointestinal: Negative for abdominal pain.  Genitourinary: Negative for dysuria.  Musculoskeletal:       Minimal faint left great toe area pain.  Skin: Negative for rash.  Neurological: Negative for dizziness, weakness, numbness and headaches.  Hematological: Negative for adenopathy. Does not bruise/bleed easily.  Psychiatric/Behavioral: Negative for behavioral problems, confusion and dysphoric mood.    Past Medical History:  Diagnosis Date  . Alcoholism (HCC)   . Anal fissure   . Anxiety   . Depression   . Hypertension   . Migraine      Social History   Socioeconomic History  . Marital status: Single    Spouse name: Not on file  . Number of children: Not on file  . Years of education: Not on file  . Highest education level: Not on file  Occupational History  . Not on file  Tobacco Use  . Smoking status: Current Every Day Smoker    Packs/day: 0.50    Types: Cigarettes  . Smokeless tobacco: Never Used  Vaping Use  . Vaping Use: Never used  Substance and Sexual Activity  . Alcohol use: Not Currently    Comment: 40 oz a day/ocassionally   . Drug use: No    Comment: in the past used coccaine. 3 yrs ago.  Marland Kitchen Sexual activity: Never   Other Topics Concern  . Not on file  Social History Narrative  . Not on file   Social Determinants of Health   Financial Resource Strain: Not on file  Food Insecurity: Not on file  Transportation Needs: Not on file  Physical Activity: Not on file  Stress: Not on file  Social Connections: Not on file  Intimate Partner Violence: Not on file    Past Surgical History:  Procedure Laterality Date  . HEMORROIDECTOMY     x2    Family History  Problem Relation Age of Onset  . Lung cancer Maternal Grandmother   . Colon cancer Neg Hx   . Esophageal cancer Neg Hx   . Rectal cancer Neg Hx   . Stomach cancer Neg Hx     No Known Allergies  Current Outpatient Medications on File Prior to Visit  Medication Sig Dispense Refill  . AMBULATORY NON FORMULARY MEDICATION Nitroglycerin ointment 0.125% apply BID rectally for 6-8 weeks 30 g 0  . amLODipine (NORVASC) 10 MG tablet TAKE 1 TABLET BY MOUTH EVERY DAY 90 tablet 3  . ASPIRIN 81 PO Take 1 tablet by mouth daily.    Marland Kitchen atorvastatin (LIPITOR) 10 MG tablet TAKE 1 TABLET BY MOUTH EVERY DAY 90 tablet 1  . busPIRone (BUSPAR) 15 MG tablet TAKE 1 TABLET (15 MG TOTAL) BY MOUTH 2 (  TWO) TIMES DAILY. 60 tablet 0  . Cyanocobalamin (BL VITAMIN B-12 PO) Take 1 tablet by mouth daily.    . fenofibrate (TRICOR) 48 MG tablet TAKE 1 TABLET BY MOUTH EVERY DAY 90 tablet 1  . Multiple Vitamins-Minerals (MULTIVITAMIN WITH MINERALS) tablet Take 1 tablet by mouth as needed (3 times a week. 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     . predniSONE (STERAPRED UNI-PAK 21 TAB) 10 MG (21) TBPK tablet Standard taper over 6 days. 21 tablet 0  . sildenafil (VIAGRA) 50 MG tablet 1 tab po as needed one hour prior to sex 10 tablet 0  . traMADol (ULTRAM) 50 MG tablet 1 tab po q 6 hours prn pain 3 tablet 0  . triamcinolone (NASACORT) 55 MCG/ACT AERO nasal inhaler as needed.    Marland Kitchen VITAMIN A PO Take 1 tablet by mouth daily.    Marland Kitchen VITAMIN D PO Take 1 tablet by mouth daily.    Marland Kitchen VITAMIN E PO  Take 1 tablet by mouth daily.     No current facility-administered medications on file prior to visit.    BP (!) 145/86   Pulse 87   Resp 18   Ht 6\' 3"  (1.905 m)   Wt 277 lb (125.6 kg)   SpO2 97%   BMI 34.62 kg/m      Objective:   Physical Exam  General Mental Status- Alert. General Appearance- Not in acute distress.   Skin General: Color- Normal Color. Moisture- Normal Moisture.  Neck Carotid Arteries- Normal color. Moisture- Normal Moisture. No carotid bruits. No JVD.  Chest and Lung Exam Auscultation: Breath Sounds:-Normal.  Cardiovascular Auscultation:Rythm- Regular. Murmurs & Other Heart Sounds:Auscultation of the heart reveals- No Murmurs.  Abdomen Inspection:-Inspeection Normal. Palpation/Percussion:Note:No mass. Palpation and Percussion of the abdomen reveal- Non Tender, Non Distended + BS, no rebound or guarding.  Neurologic Cranial Nerve exam:- CN III-XII intact(No nystagmus), symmetric smile. Strength:- 5/5 equal and symmetric strength both upper and lower extremities.  Left foot- left great toe mild tender at base. Faint warmth. Toe not swollen. Mild hallux valgus with bunion.    Assessment & Plan:  You do appear to have probable recent gout with residual pain. Will get uric acid level then decide on further tx. Also you do have some hallux valgus present with small bunion.. May refer to podiatrist in future if worsens.  Your bp is elevated today. Last visit  reading was good level. Continue amlodipine. Check bp at home. If your bp is over 140/90 then add losartan 25 mg. Continue amlodipine.  For high cholesterol continue atorvastatin and fenofibrate.  Follow up date to be determined after lab review.  , PA-C

## 2020-11-28 NOTE — Patient Instructions (Addendum)
You do appear to have probable recent gout with residual pain. Will get uric acid level then decide on further tx. Also you do have some hallux valgus present with small bunion.. May refer to podiatrist in future if worsens.  Your bp is elevated today. Last visit  reading was good level. Continue amlodipine. Check bp at home. If your bp is over 140/90 then add losartan 25 mg. Continue amlodipine. Ask to give me my chart update on Monday with bp levels and if started losartan.  For high cholesterol continue atorvastatin and fenofibrate.  Follow up date to be determined after lab review.  Bunion A bunion (hallux valgus) is a bump that forms slowly on the inner side of the big toe joint. It occurs when the big toe turns toward the second toe. Bunions may be small at first, but they often get larger over time. They can make walking painful. What are the causes? This condition may be caused by:  Wearing narrow or pointed shoes that force the big toe to press against the other toes.  Abnormal foot development that causes the foot to roll inward.  Changes in the foot that are caused by certain diseases, such as rheumatoid arthritis or polio.  A foot injury. What increases the risk? The following factors may make you more likely to develop this condition:  Wearing shoes that squeeze the toes together.  Having certain diseases, such as: ? Rheumatoid arthritis. ? Polio. ? Cerebral palsy.  Having family members who have bunions.  Being born with abnormally shaped feet (a foot deformity), such as flat feet or low arches.  Doing activities that put a lot of pressure on the feet, such as ballet dancing. What are the signs or symptoms? The main symptom of this condition is a bump on your big toe that you can notice. Other symptoms may include:  Pain.  Redness and inflammation around your big toe.  Thick or hardened skin on your big toe or between your toes.  Stiffness or loss of motion  in your big toe.  Trouble with walking.   How is this diagnosed? This condition may be diagnosed based on your symptoms, medical history, and activities. You may also have tests and imaging, such as:  X-rays. These allow your health care provider to check the position of the bones in your foot and look for damage to your joint. They also help your health care provider determine the severity of your bunion and the best way to treat it.  Joint aspiration. In this test, a sample of fluid is removed from the toe joint. This test may be done if you are in a lot of pain. It helps rule out diseases that cause painful swelling of the joints, such as arthritis or gout. How is this treated? Treatment depends on the severity of your symptoms. The goal of treatment is to relieve symptoms and prevent your bunion from getting worse. Your health care provider may recommend:  Wearing shoes that have a wide toe box, or using bunion pads to cushion the affected area.  Taping your toes together to keep them in a normal position.  Placing a device inside your shoe (orthotic device) to help reduce pressure on your toe joint.  Taking medicine to ease pain and inflammation.  Putting ice or heat on the affected area.  Doing stretching exercises.  Surgery, for severe cases. Follow these instructions at home: Managing pain, stiffness, and swelling  If directed, put ice on the  painful area. To do this: ? Put ice in a plastic bag. ? Place a towel between your skin and the bag. ? Leave the ice on for 20 minutes, 2-3 times a day. ? Remove the ice if your skin turns bright red. This is very important. If you cannot feel pain, heat, or cold, you have a greater risk of damage to the area.  If directed, apply heat to the affected area before you exercise. Use the heat source that your health care provider recommends, such as a moist heat pack or a heating pad. ? Place a towel between your skin and the heat  source. ? Leave the heat on for 20-30 minutes. ? Remove the heat if your skin turns bright red. This is especially important if you are unable to feel pain, heat, or cold. You have a greater risk of getting burned.      General instructions  Do exercises as told by your health care provider.  Support your toe joint with proper footwear, shoe padding, or taping as told by your health care provider.  Take over-the-counter and prescription medicines only as told by your health care provider.  Do not use any products that contain nicotine or tobacco, such as cigarettes, e-cigarettes, and chewing tobacco. If you need help quitting, ask your health care provider.  Keep all follow-up visits. This is important. Contact a health care provider if:  Your symptoms get worse.  Your symptoms do not improve in 2 weeks. Get help right away if:  You have severe pain and trouble with walking. Summary  A bunion is a bump on the inner side of the big toe joint that forms when the big toe turns toward the second toe.  Bunions can make walking painful.  Treatment depends on the severity of your symptoms.  Support your toe joint with proper footwear, shoe padding, or taping as told by your health care provider. This information is not intended to replace advice given to you by your health care provider. Make sure you discuss any questions you have with your health care provider. Document Revised: 01/04/2020 Document Reviewed: 01/04/2020 Elsevier Patient Education  2021 [employer].

## 2020-11-29 ENCOUNTER — Telehealth: Payer: Self-pay | Admitting: Medical

## 2020-11-29 MED ORDER — COLCHICINE 0.6 MG PO TABS: 0.6000 mg | ORAL_TABLET | Freq: Two times a day (BID) | ORAL | 0 refills | Status: DC

## 2020-11-29 NOTE — Telephone Encounter (Signed)
Rx colchicine sent to pt pharmacy. 

## 2020-12-11 ENCOUNTER — Other Ambulatory Visit: Payer: Self-pay | Admitting: Medical

## 2021-01-12 ENCOUNTER — Ambulatory Visit: Payer: 59 | Admitting: Medical

## 2021-01-15 ENCOUNTER — Encounter: Payer: Self-pay | Admitting: Medical

## 2021-01-15 ENCOUNTER — Ambulatory Visit (INDEPENDENT_AMBULATORY_CARE_PROVIDER_SITE_OTHER): Payer: 59 | Admitting: Medical

## 2021-01-15 ENCOUNTER — Other Ambulatory Visit: Payer: Self-pay

## 2021-01-15 VITALS — BP 140/85 | HR 75 | Resp 18 | Ht 74.0 in | Wt 273.6 lb

## 2021-01-15 DIAGNOSIS — F419 Anxiety disorder, unspecified: Secondary | ICD-10-CM | POA: Diagnosis not present

## 2021-01-15 DIAGNOSIS — F32A Depression, unspecified: Secondary | ICD-10-CM

## 2021-01-15 DIAGNOSIS — I10 Essential (primary) hypertension: Secondary | ICD-10-CM

## 2021-01-15 MED ORDER — VENLAFAXINE HCL ER 37.5 MG PO CP24: 37.5000 mg | ORAL_CAPSULE | Freq: Every day | ORAL | 0 refills | Status: DC

## 2021-01-15 NOTE — Patient Instructions (Addendum)
Your blood pressure is still elevated today compared to last visit. Continue amlodipine and increase your losartan to 50 mg daily.   For anxiety and depression continue buspar and will add effexor 37.5 mg daily.  For smoking cessation recommend can try patches or gum to use in place of smoking. Counseled on quitting smoking and benefits of quitting. Long term increased risk of cardiovascular.  Follow up 1 month or as needed

## 2021-01-15 NOTE — Progress Notes (Signed)
Subjective:    Patient ID: Walter Nichols, male    DOB: 03-13-1971, 50 y.o.   MRN: [id number]  HPI  Pt in for mild elevated blood pressure. Pt is on amlodipine and losartan. No gross motor or sensory function deficits. BP not improved as expected after elevation.  Pt still having stress/some anxiety. Relationship issues with daughter and girlfriend. Also some depression. Phq-9 score. Also worried about son overweight. Prior more anxious only but now getting depressed.  Pt smokes. He failed wellbutrin use. Smoking has worsened with stress and working at home.     Review of Systems  Constitutional: Negative for chills, fatigue and fever.  Respiratory: Negative for cough, chest tightness, shortness of breath and wheezing.   Cardiovascular: Negative for chest pain and palpitations.  Gastrointestinal: Negative for abdominal pain.  Musculoskeletal: Negative for back pain.  Skin: Negative for rash.  Neurological: Negative for dizziness and light-headedness.  Hematological: Negative for adenopathy. Does not bruise/bleed easily.  Psychiatric/Behavioral: Positive for dysphoric mood and sleep disturbance. Negative for agitation and confusion. The patient is nervous/anxious.      Past Medical History:  Diagnosis Date  . Alcoholism (HCC)   . Anal fissure   . Anxiety   . Depression   . Hypertension   . Migraine      Social History   Socioeconomic History  . Marital status: Single    Spouse name: Not on file  . Number of children: Not on file  . Years of education: Not on file  . Highest education level: Not on file  Occupational History  . Not on file  Tobacco Use  . Smoking status: Current Every Day Smoker    Packs/day: 0.50    Types: Cigarettes  . Smokeless tobacco: Never Used  Vaping Use  . Vaping Use: Never used  Substance and Sexual Activity  . Alcohol use: Not Currently    Comment: 40 oz a day/ocassionally   . Drug use: No    Comment: in the past used coccaine. 3 yrs  ago.  Marland Kitchen Sexual activity: Never  Other Topics Concern  . Not on file  Social History Narrative  . Not on file   Social Determinants of Health   Financial Resource Strain: Not on file  Food Insecurity: Not on file  Transportation Needs: Not on file  Physical Activity: Not on file  Stress: Not on file  Social Connections: Not on file  Intimate Partner Violence: Not on file    Past Surgical History:  Procedure Laterality Date  . HEMORROIDECTOMY     x2    Family History  Problem Relation Age of Onset  . Lung cancer Maternal Grandmother   . Colon cancer Neg Hx   . Esophageal cancer Neg Hx   . Rectal cancer Neg Hx   . Stomach cancer Neg Hx     No Known Allergies  Current Outpatient Medications on File Prior to Visit  Medication Sig Dispense Refill  . AMBULATORY NON FORMULARY MEDICATION Nitroglycerin ointment 0.125% apply BID rectally for 6-8 weeks 30 g 0  . amLODipine (NORVASC) 10 MG tablet TAKE 1 TABLET BY MOUTH EVERY DAY 90 tablet 3  . ASPIRIN 81 PO Take 1 tablet by mouth daily.    Marland Kitchen atorvastatin (LIPITOR) 10 MG tablet TAKE 1 TABLET BY MOUTH EVERY DAY 90 tablet 1  . busPIRone (BUSPAR) 15 MG tablet TAKE 1 TABLET (15 MG TOTAL) BY MOUTH 2 (TWO) TIMES DAILY. 60 tablet 0  . colchicine 0.6 MG tablet  Take 1 tablet (0.6 mg total) by mouth 2 (two) times daily. 14 tablet 0  . Cyanocobalamin (BL VITAMIN B-12 PO) Take 1 tablet by mouth daily.    . fenofibrate (TRICOR) 48 MG tablet TAKE 1 TABLET BY MOUTH EVERY DAY 90 tablet 1  . losartan (COZAAR) 25 MG tablet Take 1 tablet (25 mg total) by mouth daily. 30 tablet 2  . Multiple Vitamins-Minerals (MULTIVITAMIN WITH MINERALS) tablet Take 1 tablet by mouth as needed (3 times a week. 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     . sildenafil (VIAGRA) 50 MG tablet 1 tab po as needed one hour prior to sex 10 tablet 0  . traMADol (ULTRAM) 50 MG tablet 1 tab po q 6 hours prn pain 3 tablet 0  . triamcinolone (NASACORT) 55 MCG/ACT AERO nasal inhaler as needed.     Marland Kitchen VITAMIN A PO Take 1 tablet by mouth daily.    Marland Kitchen VITAMIN D PO Take 1 tablet by mouth daily.    Marland Kitchen VITAMIN E PO Take 1 tablet by mouth daily.     No current facility-administered medications on file prior to visit.    BP (!) 144/85   Pulse 75   Resp 18   Ht 6\' 2"  (1.88 m)   Wt 273 lb 9.6 oz (124.1 kg)   SpO2 90%   BMI 35.13 kg/m       Objective:   Physical Exam   General Mental Status- Alert. General Appearance- Not in acute distress.   Skin General: Color- Normal Color. Moisture- Normal Moisture.  Neck Carotid Arteries- Normal color. Moisture- Normal Moisture. No carotid bruits. No JVD.  Chest and Lung Exam Auscultation: Breath Sounds:-Normal.  Cardiovascular Auscultation:Rythm- Regular. Murmurs & Other Heart Sounds:Auscultation of the heart reveals- No Murmurs.  Abdomen Inspection:-Inspeection Normal. Palpation/Percussion:Note:No mass. Palpation and Percussion of the abdomen reveal- Non Tender, Non Distended + BS, no rebound or guarding.   Neurologic Cranial Nerve exam:- CN III-XII intact(No nystagmus), symmetric smile. Strength:- 5/5 equal and symmetric strength both upper and lower extremities.     Assessment & Plan:  Your blood pressure is still elevated today compared to last visit. Continue amlodipine and increase your losartan to 50 mg daily.   For anxiety and depression continue buspar and will add effexor 37.5 mg daily.  For smoking cessation recommend can try patches or gum to use in place of smoking.   Follow up 1 month or as needed  , PA-C

## 2021-02-09 ENCOUNTER — Other Ambulatory Visit: Payer: Self-pay | Admitting: Medical

## 2021-02-16 ENCOUNTER — Ambulatory Visit: Payer: 59 | Admitting: Medical

## 2021-03-31 ENCOUNTER — Other Ambulatory Visit: Payer: Self-pay | Admitting: Medical

## 2021-06-10 ENCOUNTER — Other Ambulatory Visit: Payer: Self-pay | Admitting: Medical

## 2021-06-24 ENCOUNTER — Other Ambulatory Visit: Payer: Self-pay | Admitting: Medical

## 2021-07-01 ENCOUNTER — Other Ambulatory Visit: Payer: Self-pay | Admitting: Medical

## 2021-07-16 ENCOUNTER — Telehealth: Payer: Self-pay | Admitting: *Deleted

## 2021-07-16 NOTE — Telephone Encounter (Signed)
Pt called [place] back and appointment was made for 11/07.

## 2021-07-16 NOTE — Telephone Encounter (Signed)
Pt called and pt hung up   Called back , and no answer , was unable to leave vociemail

## 2021-07-16 NOTE — Telephone Encounter (Signed)
Who Is Calling Patient / Member / Family / Caregiver Caller Name Jazen Spraggins Phone Number [phone] Patient Name Walter Nichols Patient DOB 1971/05/07 Call Type Message Only Information Provided Reason for Call Request for General Office Information Initial Comment Caller states that he is needing to get accommodation to work from home. He is high risk and concerned about working in office. Disp. Time Disposition Final User 07/15/2021 6:22:34 PM General Information Provided Yes Jackelyn Hoehn

## 2021-07-21 ENCOUNTER — Telehealth (INDEPENDENT_AMBULATORY_CARE_PROVIDER_SITE_OTHER): Payer: 59 | Admitting: Medical

## 2021-07-21 ENCOUNTER — Other Ambulatory Visit: Payer: Self-pay

## 2021-07-21 DIAGNOSIS — F17200 Nicotine dependence, unspecified, uncomplicated: Secondary | ICD-10-CM | POA: Diagnosis not present

## 2021-07-21 DIAGNOSIS — R058 Other specified cough: Secondary | ICD-10-CM

## 2021-07-21 DIAGNOSIS — Z8616 Personal history of COVID-19: Secondary | ICD-10-CM | POA: Diagnosis not present

## 2021-07-21 DIAGNOSIS — J40 Bronchitis, not specified as acute or chronic: Secondary | ICD-10-CM | POA: Diagnosis not present

## 2021-07-21 NOTE — Progress Notes (Signed)
Subjective:    Patient ID: Walter Nichols, male    DOB: 02-17-1971, 50 y.o.   MRN: [id number]  HPI Virtual Visit via Video Note  I connected with Walter Nichols on 07/21/21 at 11:20 AM EST by a video enabled telemedicine application and verified that I am speaking with the correct person using two identifiers.  Location: Patient: home Provider: office   I discussed the limitations of evaluation and management by telemedicine and the availability of in person appointments. The patient expressed understanding and agreed to proceed.  History of Present Illness:  Pt states he wants accomadation to work from home. Pt states he got covid infection in September. He was formerly vaccinated. Pt has initial 2 vaccines and one booster. Pt states 4 days or moderate symptoms and took 2 full weeks to get over the illness.   Pt states his work allows him to work from home due to [employer]. 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  He has worked from home in the past. Pt is single dad and with son psychiatric challenges it is easier to supervise.  His work is planning to do hybrid schedule.  Later this month he will get physical exam.   Observations/Objective: General-no acute distress, pleasant, oriented. Lungs- on inspection lungs appear unlabored. Neck- no tracheal deviation or jvd on inspection. Neuro- gross motor function appears intact.   Assessment and Plan: [phone] COVID infection in September.  Since then patient reports majority of signs symptoms resolved except for a daily productive cough.  No shortness of breath or wheezing noted.  Patient does smoke.  Visit done today via video so unable to auscultate lungs.  Based on smoking history and COVID infection decided to go ahead and prescribe azithromycin antibiotic and benzonatate for cough.  If symptoms persist then recommend getting chest x-ray.  Discussed patient desired to work from home in order to avoid potential infectious  type illnesses.  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  We discussed and reviewed warning accommodation today.  Gave letter to fax to staff.  Asking you to call tomorrow morning to make sure your employer received a letter.  Follow-up later this month for wellness exam.  Esperanza Richters, PA-C   Time spent with patient today was  20 minutes which consisted of chart revdiew, discussing diagnosis, work up treatment and documentation.    Follow Up Instructions:    I discussed the assessment and treatment plan with the patient. The patient was provided an opportunity to ask questions and all were answered. The patient agreed with the plan and demonstrated an understanding of the instructions.   The patient was advised to call back or seek an in-person evaluation if the symptoms worsen or if the condition fails to improve as anticipated.    Esperanza Richters, PA-C    Review of Systems  Constitutional:  Negative for chills, fatigue and fever.  HENT:  Negative for congestion and ear pain.   Respiratory:  Positive for cough. Negative for chest tightness, shortness of breath and wheezing.        He has residual dry cough.  Cardiovascular:  Negative for chest pain and palpitations.  Gastrointestinal:  Negative for abdominal pain and constipation.  Genitourinary:  Negative for dysuria, flank pain and frequency.  Musculoskeletal:  Negative for back pain, gait problem, myalgias and neck stiffness.  Skin:  Negative for rash.  Neurological:  Positive for headaches. Negative for dizziness and numbness.  Some mild ha since having covid.      Objective:   Physical Exam        Assessment & Plan:

## 2021-07-21 NOTE — Patient Instructions (Addendum)
COVID infection in September.  Since then patient reports majority of signs symptoms resolved except for a daily productive cough.  No shortness of breath or wheezing noted.  Patient does smoke.  Visit done today via video so unable to auscultate lungs.  Based on smoking history and COVID infection decided to go ahead and prescribe azithromycin antibiotic and benzonatate for cough.  If symptoms persist then recommend getting chest x-ray.  Discussed patient desired to work from home in order to avoid potential infectious type illnesses.  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  We discussed and reviewed warning accommodation today.  Gave letter to fax to staff.  Asking you to call tomorrow morning to make sure your employer received a letter.  Follow-up later this month for wellness exam.

## 2021-07-28 ENCOUNTER — Telehealth: Payer: Self-pay | Admitting: Medical

## 2021-07-28 MED ORDER — AZITHROMYCIN 250 MG PO TABS: ORAL_TABLET | ORAL | 0 refills | Status: AC

## 2021-07-28 MED ORDER — BENZONATATE 100 MG PO CAPS: 100.0000 mg | ORAL_CAPSULE | Freq: Three times a day (TID) | ORAL | 0 refills | Status: DC | PRN

## 2021-07-28 NOTE — Telephone Encounter (Signed)
Pt called but voicemail not set up to notify him on medication

## 2021-07-28 NOTE — Telephone Encounter (Signed)
Pt. Called and stated he was supposed to be prescribed medication at his virtual visit on 11/8. He stated one was an antibiotic and another was for a cough. He stated he has went to the pharmacy and each time no medication is there.

## 2021-07-28 NOTE — Telephone Encounter (Signed)
Rx benzonatate and azithromyin sent to pharmacy.

## 2021-07-29 NOTE — Telephone Encounter (Signed)
Pt called stated he will pick up medication today and will keep follow up next week

## 2021-08-05 ENCOUNTER — Encounter: Payer: 59 | Admitting: Medical

## 2021-08-12 ENCOUNTER — Telehealth: Payer: Self-pay | Admitting: Medical

## 2021-08-12 MED ORDER — METHYLPREDNISOLONE 4 MG PO TABS: 4.0000 mg | ORAL_TABLET | Freq: Every day | ORAL | 0 refills | Status: DC

## 2021-08-12 NOTE — Telephone Encounter (Signed)
Reports gout flare sent in medrol dose pack. If does not resolve needs office visit. Notify pt.

## 2021-08-12 NOTE — Telephone Encounter (Signed)
Called pt and notified.

## 2021-08-12 NOTE — Telephone Encounter (Signed)
Patient would like to know if Walter Nichols could send over a prescription for his gout. He states that he believe a flare up is about to come up and wants to start the medication. Please advice.    [id number] [place], [place] - [address] [place] [address]  Phone:  [phone]  Fax:  [phone]

## 2021-08-30 ENCOUNTER — Other Ambulatory Visit: Payer: Self-pay | Admitting: Medical

## 2021-09-15 ENCOUNTER — Ambulatory Visit (INDEPENDENT_AMBULATORY_CARE_PROVIDER_SITE_OTHER): Payer: 59 | Admitting: Medical

## 2021-09-15 VITALS — BP 138/80 | HR 64 | Temp 98.2°F | Resp 18 | Ht 74.0 in | Wt 285.0 lb

## 2021-09-15 DIAGNOSIS — I10 Essential (primary) hypertension: Secondary | ICD-10-CM

## 2021-09-15 DIAGNOSIS — F32A Depression, unspecified: Secondary | ICD-10-CM | POA: Diagnosis not present

## 2021-09-15 DIAGNOSIS — R5383 Other fatigue: Secondary | ICD-10-CM | POA: Diagnosis not present

## 2021-09-15 DIAGNOSIS — M7021 Olecranon bursitis, right elbow: Secondary | ICD-10-CM

## 2021-09-15 DIAGNOSIS — F419 Anxiety disorder, unspecified: Secondary | ICD-10-CM

## 2021-09-15 DIAGNOSIS — E559 Vitamin D deficiency, unspecified: Secondary | ICD-10-CM

## 2021-09-15 DIAGNOSIS — E785 Hyperlipidemia, unspecified: Secondary | ICD-10-CM | POA: Diagnosis not present

## 2021-09-15 LAB — CBC WITH DIFFERENTIAL/PLATELET
Basophils Absolute: 0.1 10*3/uL (ref 0.0–0.1)
Basophils Relative: 1.3 % (ref 0.0–3.0)
Eosinophils Absolute: 0.3 10*3/uL (ref 0.0–0.7)
Eosinophils Relative: 5.3 % — ABNORMAL HIGH (ref 0.0–5.0)
HCT: 42.1 % (ref 39.0–52.0)
Hemoglobin: 13.9 g/dL (ref 13.0–17.0)
Lymphocytes Relative: 44.4 % (ref 12.0–46.0)
Lymphs Abs: 2.8 10*3/uL (ref 0.7–4.0)
MCHC: 33.2 g/dL (ref 30.0–36.0)
MCV: 87 fl (ref 78.0–100.0)
Monocytes Absolute: 0.5 10*3/uL (ref 0.1–1.0)
Monocytes Relative: 8.6 % (ref 3.0–12.0)
Neutro Abs: 2.6 10*3/uL (ref 1.4–7.7)
Neutrophils Relative %: 40.4 % — ABNORMAL LOW (ref 43.0–77.0)
Platelets: 336 10*3/uL (ref 150.0–400.0)
RBC: 4.84 Mil/uL (ref 4.22–5.81)
RDW: 13.9 % (ref 11.5–15.5)
WBC: 6.4 10*3/uL (ref 4.0–10.5)

## 2021-09-15 LAB — COMPREHENSIVE METABOLIC PANEL
ALT: 45 U/L (ref 0–53)
AST: 30 U/L (ref 0–37)
Albumin: 4.4 g/dL (ref 3.5–5.2)
Alkaline Phosphatase: 70 U/L (ref 39–117)
BUN: 10 mg/dL (ref 6–23)
CO2: 23 mEq/L (ref 19–32)
Calcium: 9.5 mg/dL (ref 8.4–10.5)
Chloride: 106 mEq/L (ref 96–112)
Creatinine, Ser: 1.18 mg/dL (ref 0.40–1.50)
GFR: 72.63 mL/min (ref >60.00)
Glucose, Bld: 104 mg/dL — ABNORMAL HIGH (ref 70–99)
Potassium: 4.2 mEq/L (ref 3.5–5.1)
Sodium: 137 mEq/L (ref 135–145)
Total Bilirubin: 0.3 mg/dL (ref 0.2–1.2)
Total Protein: 7.2 g/dL (ref 6.0–8.3)

## 2021-09-15 LAB — LIPID PANEL
Cholesterol: 203 mg/dL — ABNORMAL HIGH (ref 0–200)
HDL: 28 mg/dL — ABNORMAL LOW (ref >39.00)
Total CHOL/HDL Ratio: 7
Triglycerides: 424 mg/dL — ABNORMAL HIGH (ref 0.0–149.0)

## 2021-09-15 LAB — LDL CHOLESTEROL, DIRECT: Direct LDL: 122 mg/dL

## 2021-09-15 LAB — VITAMIN B12: Vitamin B-12: 732 pg/mL (ref 211–911)

## 2021-09-15 LAB — VITAMIN D 25 HYDROXY (VIT D DEFICIENCY, FRACTURES): VITD: 20.64 ng/mL — ABNORMAL LOW (ref 30.00–100.00)

## 2021-09-15 MED ORDER — BUSPIRONE HCL 15 MG PO TABS: 15.0000 mg | ORAL_TABLET | Freq: Three times a day (TID) | ORAL | 1 refills | Status: DC

## 2021-09-15 MED ORDER — VENLAFAXINE HCL ER 75 MG PO CP24: 75.0000 mg | ORAL_CAPSULE | Freq: Every day | ORAL | 3 refills | Status: DC

## 2021-09-15 NOTE — Addendum Note (Signed)
Addended by: Mervin Kung A on: 09/15/2021 11:44 AM   Modules accepted: Orders

## 2021-09-15 NOTE — Progress Notes (Signed)
Subjective:    Patient ID: Walter Nichols, male    DOB: 12-Mar-1971, 51 y.o.   MRN: [id number]  HPI Pt states he had very emotional christmas.   Pt is sad about his daughter estanged situation. Daughter lives with mother. His daughter won't talk to [place].  Pt feels fidgety and nervous. Feeling fatigued, lazy, lack of motivation and anxious.  He stats he does not want to admit to depression.  Former alcohol and substance abuse. He recently admits to drinking again and smoking marijuana. Also he broke up with his girlfriend.  He is drinking 40 oz budweiser daily. Would smoker marijuana about 1 time every 2 weeks.  Phq-9 score is . Pt is on effexor 37.5 mg q day.  Anxiety- gad 7 score is 10.   Htn- bp controlled today. On losartan and amlodipine.  Hyperlpidemia- on fenofibrate 48 mg daily.   He wants me to fill out intermittent leave form     Review of Systems  Constitutional:  Negative for chills and fatigue.  HENT:  Negative for congestion.   Respiratory:  Negative for cough, chest tightness, shortness of breath and wheezing.   Cardiovascular:  Negative for chest pain and palpitations.  Gastrointestinal:  Negative for abdominal pain, nausea and vomiting.  Genitourinary:  Negative for dysuria.  Musculoskeletal:  Negative for back pain, myalgias and neck pain.  Skin:  Negative for rash.  Neurological:  Negative for dizziness, speech difficulty, weakness, numbness and headaches.  Hematological:  Negative for adenopathy. Does not bruise/bleed easily.  Psychiatric/Behavioral:  Positive for dysphoric mood. Negative for agitation, confusion and suicidal ideas. The patient is nervous/anxious.    Past Medical History:  Diagnosis Date   Alcoholism (HCC)    Anal fissure    Anxiety    Depression    Hypertension    Migraine      Social History   Socioeconomic History   Marital status: Single    Spouse name: Not on file   Number of children: Not on file   Years of  education: Not on file   Highest education level: Not on file  Occupational History   Not on file  Tobacco Use   Smoking status: Every Day    Packs/day: 0.50    Types: Cigarettes   Smokeless tobacco: Never  Vaping Use   Vaping Use: Never used  Substance and Sexual Activity   Alcohol use: Not Currently    Comment: 40 oz a day/ocassionally    Drug use: No    Comment: in the past used coccaine. 3 yrs ago.   Sexual activity: Never  Other Topics Concern   Not on file  Social History Narrative   Not on file   Social Determinants of Health   Financial Resource Strain: Not on file  Food Insecurity: Not on file  Transportation Needs: Not on file  Physical Activity: Not on file  Stress: Not on file  Social Connections: Not on file  Intimate Partner Violence: Not on file    Past Surgical History:  Procedure Laterality Date   HEMORROIDECTOMY     x2    Family History  Problem Relation Age of Onset   Lung cancer Maternal Grandmother    Colon cancer Neg Hx    Esophageal cancer Neg Hx    Rectal cancer Neg Hx    Stomach cancer Neg Hx     No Known Allergies  Current Outpatient Medications on File Prior to Visit  Medication Sig Dispense Refill  AMBULATORY NON FORMULARY MEDICATION Nitroglycerin ointment 0.125% apply BID rectally for 6-8 weeks 30 g 0   amLODipine (NORVASC) 10 MG tablet TAKE 1 TABLET BY MOUTH EVERY DAY 90 tablet 3   ASPIRIN 81 PO Take 1 tablet by mouth daily.     atorvastatin (LIPITOR) 10 MG tablet TAKE 1 TABLET BY MOUTH EVERY DAY 90 tablet 1   benzonatate (TESSALON) 100 MG capsule Take 1 capsule (100 mg total) by mouth 3 (three) times daily as needed for cough. 30 capsule 0   busPIRone (BUSPAR) 15 MG tablet TAKE 1 TABLET (15 MG TOTAL) BY MOUTH 2 (TWO) TIMES DAILY. 60 tablet 0   Cyanocobalamin (BL VITAMIN B-12 PO) Take 1 tablet by mouth daily.     fenofibrate (TRICOR) 48 MG tablet TAKE 1 TABLET BY MOUTH EVERY DAY 90 tablet 1   losartan (COZAAR) 25 MG tablet  TAKE 1 TABLET (25 MG TOTAL) BY MOUTH DAILY. 90 tablet 2   methylPREDNISolone (MEDROL) 4 MG tablet Take 1 tablet (4 mg total) by mouth daily. 21 tablet 0   Multiple Vitamins-Minerals (MULTIVITAMIN WITH MINERALS) tablet Take 1 tablet by mouth as needed (3 times a week. 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      sildenafil (VIAGRA) 50 MG tablet 1 tab po as needed one hour prior to sex 10 tablet 0   triamcinolone (NASACORT) 55 MCG/ACT AERO nasal inhaler as needed.     venlafaxine XR (EFFEXOR-XR) 37.5 MG 24 hr capsule Take 1 capsule (37.5 mg total) by mouth daily with breakfast. 90 capsule 2   VITAMIN A PO Take 1 tablet by mouth daily.     VITAMIN D PO Take 1 tablet by mouth daily.     VITAMIN E PO Take 1 tablet by mouth daily.     No current facility-administered medications on file prior to visit.    BP 138/80 (BP Location: Left Arm, Patient Position: Sitting, Cuff Size: Large)    Pulse 64    Temp 98.2 F (36.8 C) (Oral)    Resp 18    Ht 6\' 2"  (1.88 m)    Wt 285 lb (129.3 kg)    SpO2 97%    BMI 36.59 kg/m        Objective:   Physical Exam  General Mental Status- Alert. General Appearance- Not in acute distress.   S Chest and Lung Exam Auscultation: Breath Sounds:-Normal.  Cardiovascular Auscultation:Rythm- Regular. Murmurs & Other Heart Sounds:Auscultation of the heart reveals- No Murmurs.  Abdomen Inspection:-Inspeection Normal. Palpation/Percussion:Note:No mass. Palpation and Percussion of the abdomen reveal- Non Tender, Non Distended + BS, no rebound or guarding.  Neurologic Cranial Nerve exam:- CN III-XII intact(No nystagmus), symmetric smile. Strength:- 5/5 equal and symmetric strength both upper and lower extremities.       Assessment & Plan:   Patient Instructions  Recent increased anxiety with some depression based on depression and anxiety scale. Increase your venlafaxine to 75 mg daily and increase your buspar to 15 mg three times daily.   Do want you to establish  counseling as you mentioned will do.  Htn- well controlled.  Hyperlipidemia- will get cmp and lipid panel today. Continue current medications.  For fatigue will add fatigue labs.  Recommend with substance abuse history not to drink alcohol or use drugs as you fought that battle in past successfully and went thru rehab.  Follow up in one month or sooner if needed.   , PA-C

## 2021-09-15 NOTE — Patient Instructions (Addendum)
Recent increased anxiety with some depression based on depression and anxiety scale. Increase your venlafaxine to 75 mg daily and increase your buspar to 15 mg three times daily.   Do want you to establish counseling as you mentioned will do.  Htn- well controlled.  Hyperlipidemia- will get cmp and lipid panel today. Continue current medications.  For fatigue will add fatigue labs.  Recommend with substance abuse history not to drink alcohol or use drugs as you fought that battle in past successfully and went thru rehab.  Follow up in one month or sooner if needed.  Recent rt elbow bursitis. Recommend ice twice daily and low dose ibuprofen. If are persists let me know and will refer to sports med md  Depending on complexity of fmla form may need phone visit to fill out form.   Elbow Bursitis Bursitis is swelling and pain at the tip of your elbow. This happens when fluid builds up in a sac under your skin (bursa). This may also be called olecranon bursitis. Follow these instructions at home: Medicines Take over-the-counter and prescription medicines only as told by your doctor. If you were prescribed an antibiotic, take it exactly as told by your doctor. Do not stop taking it even if you start to feel better. Managing pain, stiffness, and swelling  If told, put ice on your elbow: Put ice in a plastic bag. Place a towel between your skin and the bag. Leave the ice on for 20 minutes, 2-3 times a day. If your bursitis is caused by an injury, follow instructions from your doctor about: Resting your elbow. Wearing a bandage. Wear elbow pads or elbow wraps as needed. These help cushion your elbow. General instructions Avoid any activities that cause elbow pain. Ask your doctor what activities are safe for you. Keep all follow-up visits as told by your doctor. This is important. Contact a doctor if you have: A fever. Problems that do not get better with treatment. Pain or swelling  that: Gets worse. Goes away and then comes back. Pus draining from your elbow. Get help right away if you have: Trouble moving your arm, hand, or fingers. Summary Bursitis is swelling and pain at the tip of the elbow. You may need to take medicine or put ice on your elbow. Contact your doctor if your problems do not get better with treatment. This information is not intended to replace advice given to you by your health care provider. Make sure you discuss any questions you have with your health care provider. Document Revised: 02/06/2020 Document Reviewed: 03/05/2020 Elsevier Patient Education  2022 [employer].

## 2021-09-16 MED ORDER — VITAMIN D (ERGOCALCIFEROL) 1.25 MG (50000 UNIT) PO CAPS: 50000.0000 [IU] | ORAL_CAPSULE | ORAL | 0 refills | Status: DC

## 2021-09-16 MED ORDER — ATORVASTATIN CALCIUM 10 MG PO TABS: 10.0000 mg | ORAL_TABLET | Freq: Every day | ORAL | 3 refills | Status: DC

## 2021-09-16 NOTE — Addendum Note (Signed)
Addended by: Gwenevere Abbot on: 09/16/2021 09:25 PM   Modules accepted: Orders

## 2021-09-22 LAB — VITAMIN B1: Vitamin B1 (Thiamine): 9 nmol/L (ref 8–30)

## 2021-09-24 ENCOUNTER — Telehealth: Payer: Self-pay | Admitting: Medical

## 2021-09-24 NOTE — Telephone Encounter (Signed)
Patient wants to make Walter Nichols aware that we will be receiving FMLA forms from Walter Nichols for him to fill out. Patient states that those form need to be filled out and faxed back by 01/19. Pt was informed of the 5-7 days time frame for paperwork, and he stated he was available for any questions. Please advice.

## 2021-09-25 NOTE — Telephone Encounter (Signed)
Spoke with patient and made him aware FMLA has not been received , also made him a VV

## 2021-09-29 ENCOUNTER — Telehealth (INDEPENDENT_AMBULATORY_CARE_PROVIDER_SITE_OTHER): Payer: 59 | Admitting: Medical

## 2021-09-29 DIAGNOSIS — E559 Vitamin D deficiency, unspecified: Secondary | ICD-10-CM | POA: Diagnosis not present

## 2021-09-29 DIAGNOSIS — F32A Depression, unspecified: Secondary | ICD-10-CM

## 2021-09-29 DIAGNOSIS — F419 Anxiety disorder, unspecified: Secondary | ICD-10-CM | POA: Diagnosis not present

## 2021-09-29 NOTE — Patient Instructions (Signed)
Anxiety and depression.  Presently stable with current medication regimen.  I did go ahead and fill out FMLA form providing, for 2 episodes per month and 2 days per episode.  Asking my medical assistant to fill out the various number days that I had seen patient in the past for both conditions.  After that portion of a form filled we will fax over finished paperwork.  Vitamin D deficiency.  Discussed weekly vitamin D for 8 weeks and then repeating vitamin D level in 9 weeks.  Also reviewed all other labs drawn during last visit as patient states he had not been called back yet.  Follow-up early February as planned or sooner if needed.

## 2021-09-29 NOTE — Progress Notes (Signed)
° °  Subjective:    Patient ID: Walter Nichols, male    DOB: 03-21-1971, 51 y.o.   MRN: [id number]  HPI  Virtual Visit via Video Note  I connected with Walter Nichols on 09/29/21 at 11:20 AM EST by a video enabled telemedicine application and verified that I am speaking with the correct person using two identifiers.  Location: Patient: home Provider: office   I discussed the limitations of evaluation and management by telemedicine and the availability of in person appointments. The patient expressed understanding and agreed to proceed.  History of Present Illness:   Pt wanted me to fill out fmla form in event he needs periodic days off for anxiety, depression and history of substance abuse. Presently he feels stable on venlafaxine and buspar.   Pt has low vit d. Level was 20.64. Advised to take the vit weekly rx sent to pharmacy. Will get scheduled for repeat vit D in 9 weeks.     Observations/Objective: General-no acute distress, pleasant, oriented. Lungs- on inspection lungs appear unlabored. Neck- no tracheal deviation or jvd on inspection. Neuro- gross motor function appears intact.   Assessment and Plan: Patient Instructions  Anxiety and depression.  Presently stable with current medication regimen.  I did go ahead and fill out FMLA form providing, for 2 episodes per month and 2 days per episode.  Asking my medical assistant to fill out the various number days that I had seen patient in the past for both conditions.  After that portion of a form filled we will fax over finished paperwork.  Vitamin D deficiency.  Discussed weekly vitamin D for 8 weeks and then repeating vitamin D level in 9 weeks.  Also reviewed all other labs drawn during last visit as patient states he had not been called back yet.  Follow-up early February as planned or sooner if needed.   Esperanza Richters, PA-C   Time spent with patient today was 31  minutes which consisted of chart review, discussing  diagnosis, work up, treatment, filling out FMLA form and documentation.   Follow Up Instructions:    I discussed the assessment and treatment plan with the patient. The patient was provided an opportunity to ask questions and all were answered. The patient agreed with the plan and demonstrated an understanding of the instructions.   The patient was advised to call back or seek an in-person evaluation if the symptoms worsen or if the condition fails to improve as anticipated.   Esperanza Richters, PA-C   Review of Systems     Objective:   Physical Exam        Assessment & Plan:

## 2021-10-07 ENCOUNTER — Other Ambulatory Visit: Payer: Self-pay | Admitting: Medical

## 2021-10-16 ENCOUNTER — Ambulatory Visit: Payer: 59 | Admitting: Medical

## 2021-10-16 ENCOUNTER — Telehealth: Payer: Self-pay

## 2021-10-16 NOTE — Telephone Encounter (Signed)
Caller Name Merrik Puebla Caller Phone Number [phone] Patient Name Walter Nichols Patient DOB 1970-12-19 Call Type Message Only Information Provided Reason for Call Request to Reschedule Office Appointment Initial Comment Caller states might have an appointment scheduled for tomorrow morning. Needs to be rescheduled. Patient request to speak to RN No Additional Comment Thank You 820 AM Disp. Time Disposition Final User 10/15/2021 7:22:57 PM General Information Provided Yes Jimmie Molly Call Closed By: Jimmie Molly Transaction Date/Time: 10/15/2021 7:20:07 PM (ET)

## 2021-10-20 ENCOUNTER — Other Ambulatory Visit: Payer: Self-pay | Admitting: Medical

## 2021-10-27 ENCOUNTER — Ambulatory Visit (INDEPENDENT_AMBULATORY_CARE_PROVIDER_SITE_OTHER): Payer: 59 | Admitting: Medical

## 2021-10-27 VITALS — BP 137/83 | HR 84 | Resp 20 | Ht 74.0 in | Wt 279.0 lb

## 2021-10-27 DIAGNOSIS — F32A Depression, unspecified: Secondary | ICD-10-CM

## 2021-10-27 DIAGNOSIS — I10 Essential (primary) hypertension: Secondary | ICD-10-CM

## 2021-10-27 DIAGNOSIS — F419 Anxiety disorder, unspecified: Secondary | ICD-10-CM

## 2021-10-27 DIAGNOSIS — E785 Hyperlipidemia, unspecified: Secondary | ICD-10-CM | POA: Diagnosis not present

## 2021-10-27 DIAGNOSIS — F17200 Nicotine dependence, unspecified, uncomplicated: Secondary | ICD-10-CM

## 2021-10-27 DIAGNOSIS — M7021 Olecranon bursitis, right elbow: Secondary | ICD-10-CM

## 2021-10-27 MED ORDER — VENLAFAXINE HCL ER 150 MG PO TB24: ORAL_TABLET | ORAL | 3 refills | Status: DC

## 2021-10-27 NOTE — Patient Instructions (Addendum)
Htn- bp controlled. Continue losartan and amlodipine.  Anxiety and depression. Moderate phq-9 score and GAD-7 score. Continue same dose buspar. I could increase your effexor to 150 mg. This could help with mood and energy. Other options I don't think good idea as you have already tried wellbutrin. Continue with counseling. Continue to work out.  For high cholesterol continue atorvastatin and fenofibrate.  Placed referral to sports med for olecranon bursitis.  Recommend stop smoking   Follow up in 2 weeks or sooner if needed.   Walter Nichols
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
## 2021-10-29 ENCOUNTER — Ambulatory Visit: Payer: Self-pay

## 2021-10-29 ENCOUNTER — Ambulatory Visit (INDEPENDENT_AMBULATORY_CARE_PROVIDER_SITE_OTHER): Payer: 59 | Admitting: Family Medicine

## 2021-10-29 VITALS — BP 132/90 | Ht 74.0 in | Wt 279.0 lb

## 2021-10-29 DIAGNOSIS — M7021 Olecranon bursitis, right elbow: Secondary | ICD-10-CM | POA: Insufficient documentation

## 2021-10-29 NOTE — Assessment & Plan Note (Signed)
Acutely occurring.  No signs of infection or inflammatory origin. -Counseled on home exercise therapy and supportive care. -Counseled on compression. -Could consider aspiration if ongoing.

## 2021-10-29 NOTE — Progress Notes (Signed)
°  Daivon Rayos - 51 y.o. male MRN [id number]  Date of birth: 09-Mar-1971  SUBJECTIVE:  Including CC & ROS.  No chief complaint on file.   Tashi Band is a 51 y.o. male that is presenting with acute right elbow pain.  The pain is posterior nature.  Denies any injury or inciting event..  Review of the note from 2/14 shows he was counseled on supportive care.   Review of Systems See HPI   HISTORY: Past Medical, Surgical, Social, and Family History Reviewed & Updated per EMR.   Pertinent Historical Findings include:  Past Medical History:  Diagnosis Date   Alcoholism (HCC)    Anal fissure    Anxiety    Depression    Hypertension    Migraine     Past Surgical History:  Procedure Laterality Date   HEMORROIDECTOMY     x2     PHYSICAL EXAM:  VS: BP 132/90 (BP Location: Left Arm, Patient Position: Sitting)    Ht 6\' 2"  (1.88 m)    Wt 279 lb (126.6 kg)    BMI 35.82 kg/m  Physical Exam Gen: NAD, alert, cooperative with exam, well-appearing MSK: Neurovascularly intact    Limited ultrasound: Right elbow:  Normal-appearing triceps tendon at the insertion. Bursitis appreciated overlying the olecranon.  No increased hyperemia or debris within the bursa.  Summary: Olecranon bursitis  Ultrasound and interpretation by , MD    ASSESSMENT & PLAN:   Olecranon bursitis of right elbow Acutely occurring.  No signs of infection or inflammatory origin. -Counseled on home exercise therapy and supportive care. -Counseled on compression. -Could consider aspiration if ongoing.

## 2021-10-29 NOTE — Patient Instructions (Signed)
Nice to meet you Please try compression  Please avoid resting on your elbow   Please send me a message in MyChart with any questions or updates.  Please see me back in 4 weeks or as needed if better.   --Dr. Raeford Razor

## 2021-11-03 ENCOUNTER — Telehealth: Payer: Self-pay | Admitting: Medical

## 2021-11-03 NOTE — Telephone Encounter (Signed)
Called pt notified him we have not received FMLA papers yet , stated he will call and let the office know

## 2021-11-03 NOTE — Telephone Encounter (Signed)
Forms faxed into front office Placed in [facility] folder

## 2021-11-03 NOTE — Telephone Encounter (Signed)
Patient would like to know if his short term leave papers that were faxed over by [facility] were received. He would like a call back if not. Please advise.

## 2021-11-04 NOTE — Telephone Encounter (Signed)
Form picked up

## 2021-11-10 ENCOUNTER — Ambulatory Visit (INDEPENDENT_AMBULATORY_CARE_PROVIDER_SITE_OTHER): Payer: 59 | Admitting: Medical

## 2021-11-10 VITALS — BP 132/80 | HR 64 | Temp 98.2°F | Ht 74.0 in | Wt 273.0 lb

## 2021-11-10 DIAGNOSIS — F419 Anxiety disorder, unspecified: Secondary | ICD-10-CM

## 2021-11-10 DIAGNOSIS — F32A Depression, unspecified: Secondary | ICD-10-CM

## 2021-11-10 MED ORDER — DOXYCYCLINE HYCLATE 100 MG PO TABS: 100.0000 mg | ORAL_TABLET | Freq: Two times a day (BID) | ORAL | 0 refills | Status: DC

## 2021-11-10 NOTE — Telephone Encounter (Signed)
Form re-faxed

## 2021-11-10 NOTE — Patient Instructions (Addendum)
Depression and anxiety. More anxiety-doing better with buspar. Gave letter asking Walter Nichols to refer to your fmla paperwork. New paperwork you gave me today is lengthy and complex. Will review and see if I can fill out. May need specialist to fill out. Will review form and may need to do virtual visit to fill out.    Scalp infection verses large diffuse lipoma. Rx Doxycycline.   Follow up in one week or sooner if needed.
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
## 2021-11-10 NOTE — Telephone Encounter (Signed)
Pt states [employer] for short term disability only received fax cover sheets and not the actual documents for the extension.   Please refax documents!
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
## 2021-11-17 ENCOUNTER — Ambulatory Visit (INDEPENDENT_AMBULATORY_CARE_PROVIDER_SITE_OTHER): Payer: 59 | Admitting: Medical

## 2021-11-17 ENCOUNTER — Telehealth: Payer: Self-pay | Admitting: Medical

## 2021-11-17 ENCOUNTER — Encounter: Payer: Self-pay | Admitting: Medical

## 2021-11-17 VITALS — BP 130/80 | HR 67 | Resp 18 | Ht 74.0 in | Wt 276.0 lb

## 2021-11-17 DIAGNOSIS — F1910 Other psychoactive substance abuse, uncomplicated: Secondary | ICD-10-CM

## 2021-11-17 DIAGNOSIS — F419 Anxiety disorder, unspecified: Secondary | ICD-10-CM | POA: Diagnosis not present

## 2021-11-17 DIAGNOSIS — F32A Depression, unspecified: Secondary | ICD-10-CM | POA: Diagnosis not present

## 2021-11-17 MED ORDER — VENLAFAXINE HCL ER 150 MG PO CP24: 150.0000 mg | ORAL_CAPSULE | Freq: Every day | ORAL | 11 refills | Status: DC

## 2021-11-17 NOTE — Telephone Encounter (Signed)
Patient called stating the pharmacy told him that his insurance wont cover Venlafaxine HCl 150 MG TB24 in tables but they will cover the med if its sent as capsules. He would like to get it changed and resent. Please advise.  ? ? ?[id number] - [place], [place] - [address]  ?[address] [place] [address]  ?Phone:  [phone]  Fax:  [phone]  ?

## 2021-11-17 NOTE — Addendum Note (Signed)
Addended by: Anabel Halon on: 11/17/2021 01:25 PM ? ? Modules accepted: Orders ? ?

## 2021-11-17 NOTE — Patient Instructions (Addendum)
Anxiety, depression and substance abuse. Time off recently has been helpful. Improved to point can return to work but still some struggle. Filled out extensive form today. ? ?Continue both buspar and effexor. ? ?If any thoughts of harm to self or others be seen in ED. ? ?Recommend continue counseling for mood. Also get back in with AA.  ? ?Follow up in 3 weeks or sooner if needed. ?

## 2021-11-17 NOTE — Telephone Encounter (Signed)
Pt.notified

## 2021-12-16 ENCOUNTER — Telehealth: Payer: Self-pay | Admitting: Medical

## 2021-12-16 NOTE — Telephone Encounter (Signed)
No appointments open for tomorrow , and PM appointments for Monday ?

## 2021-12-16 NOTE — Telephone Encounter (Signed)
Pt is thinking about returning to work on Monday instead of his return date. Please advise.  ?

## 2021-12-17 NOTE — Telephone Encounter (Signed)
Patient wants an extension , he was supposed to go back march 16th but wants to return 12/21/2021  ?

## 2021-12-17 NOTE — Telephone Encounter (Signed)
Appt made for 12/21/21  ? ?Forms faxed in with extension date ?

## 2021-12-21 ENCOUNTER — Telehealth (INDEPENDENT_AMBULATORY_CARE_PROVIDER_SITE_OTHER): Payer: 59 | Admitting: Medical

## 2021-12-21 DIAGNOSIS — F419 Anxiety disorder, unspecified: Secondary | ICD-10-CM | POA: Diagnosis not present

## 2021-12-21 DIAGNOSIS — F32A Depression, unspecified: Secondary | ICD-10-CM | POA: Diagnosis not present

## 2021-12-21 DIAGNOSIS — E559 Vitamin D deficiency, unspecified: Secondary | ICD-10-CM

## 2021-12-21 DIAGNOSIS — F1910 Other psychoactive substance abuse, uncomplicated: Secondary | ICD-10-CM | POA: Diagnosis not present

## 2021-12-21 DIAGNOSIS — G4700 Insomnia, unspecified: Secondary | ICD-10-CM

## 2021-12-21 MED ORDER — TRAZODONE HCL 50 MG PO TABS: ORAL_TABLET | ORAL | 0 refills | Status: DC

## 2021-12-21 NOTE — Patient Instructions (Addendum)
Anxiety, depression, substance abuse history and anxiety. Symptoms some improved enough that can return to work per pt. Continue effexor, buspar and adding trazadone to help you sleep. Rx advisement given on meds. Review educations. If were to have then stop buspar, effexor and trazadone. Any severe symptoms then be seen in ED. ? ?Follow up on 1 month or sooner if needed. ? ?Serotonin Syndrome ?Serotonin is a chemical in your body (neurotransmitter) that helps to control several functions, such as: ?Brain and nerve cell function. ?Mood and emotions. ?Memory. ?Eating. ?Sleeping. ?Sexual activity. ?Stress response. ?Having too much serotonin in your body can cause serotonin syndrome. This condition can be harmful to your brain and nerve cells. This can be a life-threatening condition. ?What are the causes? ?This condition may be caused by taking medicines or drugs that increase the level of serotonin in your body, such as: ?Antidepressant medicines. ?Migraine medicines. ?Certain pain medicines. ?Certain drugs, including ecstasy, LSD, cocaine, and amphetamines. ?Over-the-counter cough or cold medicines that contain dextromethorphan. ?Certain herbal supplements, including St. John's wort, ginseng, and nutmeg. ?This condition usually occurs when you take these medicines or drugs in combination, but it can also happen with a high dose of a single medicine or drug. ?What increases the risk? ?You are more likely to develop this condition if: ?You just started taking a medicine or drug that increases the level of serotonin in the body. ?You recently increased the dose of a medicine or drug that increases the level of serotonin in the body. ?You take more than one medicine or drug that increases the level of serotonin in the body. ?What are the signs or symptoms? ?Symptoms of this condition usually start within several hours of taking a medicine or drug. Symptoms may be mild or severe. Mild symptoms  include: ?Sweating. ?Restlessness or agitation. ?Muscle twitching or stiffness. ?Rapid heart rate. ?Nausea and vomiting. ?Diarrhea. ?Headache. ?Shivering or goose bumps. ?Confusion. ?Severe symptoms include: ?Irregular heartbeat. ?Seizures. ?Loss of consciousness. ?High fever. ?How is this diagnosed? ?This condition may be diagnosed based on: ?Your medical history.  ?A physical exam. ?Your prior use of drugs and medicines. ?Blood or urine tests. These may be used to rule out other causes of your symptoms. ?How is this treated? ?The treatment for this condition depends on the severity of your symptoms. ?For mild cases, stopping the medicine or drug that caused your condition is usually all that is needed. ?For moderate to severe cases, treatment in a hospital may be needed to prevent or manage life-threatening symptoms. This may include medicines to control your symptoms, IV fluids, interventions to support your breathing, and treatments to control your body temperature. ?Follow these instructions at home: ?Medicines ? ?Take over-the-counter and prescription medicines only as told by your health care provider. This is important. ?Check with your health care provider before you start taking any new prescriptions, over-the-counter medicines, herbs, or supplements. ?Avoid combining any medicines that can cause this condition to occur. ?Lifestyle ? ?Maintain a healthy lifestyle. ?Eat a healthy diet that includes plenty of vegetables, fruits, whole grains, low-fat dairy products, and lean protein. Do not eat a lot of foods that are high in fat, added sugars, or salt. ?Get the right amount and quality of sleep. Most adults need 7-9 hours of sleep each night. ?Make time to exercise, even if it is only for short periods of time. Most adults should exercise for at least 150 minutes each week. ?Do not drink alcohol. ?Do not use illegal drugs, and do  not take medicines for reasons other than they are prescribed. ?General  instructions ?Do not use any products that contain nicotine or tobacco, such as cigarettes and e-cigarettes. If you need help quitting, ask your health care provider. ?Keep all follow-up visits as told by your health care provider. This is important. ?Contact a health care provider if: ?Your symptoms do not improve or they get worse. ?Get help right away if you: ?Have worsening confusion, severe headache, chest pain, high fever, seizures, or loss of consciousness. ?Experience serious side effects of medicine, such as swelling of your face, lips, tongue, or throat. ?Have serious thoughts about hurting yourself or others. ?These symptoms may represent a serious problem that is an emergency. Do not wait to see if the symptoms will go away. Get medical help right away. Call your local emergency services (911 in the [place] Do not drive yourself to the hospital. ?If you ever feel like you may hurt yourself or others, or have thoughts about taking your own life, get help right away. You can go to your nearest emergency department or call: ?Your local emergency services (911 in the [place] ??A suicide crisis helpline, such as the [facility] at (463) 403-4315. This is open 24 hours a day. ?Summary ?Serotonin is a brain chemical that helps to regulate the nervous system. High levels of serotonin in the body can cause serotonin syndrome, which is a very dangerous condition. ?This condition may be caused by taking medicines or drugs that increase the level of serotonin in your body. ?Treatment depends on the severity of your symptoms. For mild cases, stopping the medicine or drug that caused your condition is usually all that is needed. ?Check with your health care provider before you start taking any new prescriptions, over-the-counter medicines, herbs, or supplements. ?This information is not intended to replace advice given to you by your health care provider. Make sure you discuss any questions you  have with your health care provider. ?Document Revised: 10/07/2017 Document Reviewed: 10/07/2017 ?Elsevier Patient Education ? [place]. ? ?

## 2021-12-21 NOTE — Progress Notes (Addendum)
? ?  Subjective:  ? ? Patient ID: Walter Nichols, male    DOB: 09-23-1970, 51 y.o.   MRN: [id number] ? ?HPI ? ?Virtual Visit via Video Note ? ?I connected with Walter Nichols on 12/21/21 at  3:40 PM EDT by a video enabled telemedicine application and verified that I am speaking with the correct person using two identifiers. ? ?Location: ?Patient: home ?Provider: office ?  ?I discussed the limitations of evaluation and management by telemedicine and the availability of in person appointments. The patient expressed understanding and agreed to proceed. ? ?History of Present Illness: ?Pt states he is back to work. He states computer issues as he is returning to work. He did attend to work. Pt returned to work earlier than formerly planned.  I had filled out forms for anxiety, depression and substance abuse. He expressed he feels like necessary to work but mood not ideal. He is beginning to garden which he enjoys and finds relaxing. ? ?He states he has been sleeping intermittently. He sleeps for about 2.5 hours wakes up and then will sleep again for 2.5 hours. States hard to fall back asleep. He states at times hard to wake up since not sleeping well. In the past he has tried melatonin. Has not helped adequately. ?  ?Observations/Objective: ? ?General-no acute distress, pleasant, oriented. ?Lungs- on inspection lungs appear unlabored. ?Neck- no tracheal deviation or jvd on inspection. ?Neuro- gross motor function appears intact.  ? ?Assessment and Plan: ?Anxiety, depression, substance abuse history and anxiety. Symptoms some improved enough that can return to work per pt. Continue effexor, buspar and adding trazadone to help you sleep. Rx advisement given on meds. Review educations. If were to have then stop buspar, effexor and trazadone. Any severe symptoms then be seen in ED. ? ?Follow up on 1 month or sooner if needed. ? ?Time spent with patient today was  20 minutes which consisted of chart review, discussing diagnosis, work  up treatment and documentation.  ? ?Follow Up Instructions: ? ?  ?I discussed the assessment and treatment plan with the patient. The patient was provided an opportunity to ask questions and all were answered. The patient agreed with the plan and demonstrated an understanding of the instructions. ?  ?The patient was advised to call back or seek an in-person evaluation if the symptoms worsen or if the condition fails to improve as anticipated. ? ? ? ? ?Esperanza Richters, PA-C  ? ? ?Review of Systems  ?Constitutional:  Negative for chills, fatigue and fever.  ?HENT:  Negative for congestion.   ?Respiratory:  Negative for cough, chest tightness, shortness of breath and wheezing.   ?Cardiovascular:  Negative for chest pain and palpitations.  ?Gastrointestinal:  Negative for abdominal pain, anal bleeding, blood in stool and constipation.  ?Neurological:  Negative for dizziness, tremors, speech difficulty, light-headedness and numbness.  ?Hematological:  Negative for adenopathy. Does not bruise/bleed easily.  ?Psychiatric/Behavioral:  Positive for dysphoric mood and sleep disturbance. Negative for behavioral problems and suicidal ideas. The patient is nervous/anxious.   ? ?   ?Objective:  ? Physical Exam ? ? ? ? ?   ?Assessment & Plan:  ? ? ?

## 2021-12-23 ENCOUNTER — Telehealth: Payer: Self-pay

## 2021-12-23 NOTE — Telephone Encounter (Signed)
Pt was supposed to go back march 13th and went back April 10th , just need a letter stating he is still under your care and still following up with you since he went back later than expected ?

## 2021-12-23 NOTE — Telephone Encounter (Signed)
Patient called stating his payheck was short because his HR department stated his FMLA needed to be fixed since he had an extension,  ? ? ?Needs a letter stating he is still following up under your care and he needed an extension due to no improvement ? ?Pt also has a follow up appt in 1 month ? ? ?Claim ID - [id number] ?Fax number: [phone] ? ? ? ?

## 2021-12-24 NOTE — Telephone Encounter (Signed)
Letter faxed.

## 2021-12-24 NOTE — Telephone Encounter (Signed)
Patient wanted to make Walter Nichols aware that his job is sending his FMLA paperwork to another outside provider and he would be reaching out to [name] to discuss the patient's current situation. He states the sooner it is done, the sooner he will be able to get his paycheck.  ?

## 2021-12-26 ENCOUNTER — Other Ambulatory Visit: Payer: Self-pay | Admitting: Medical

## 2021-12-30 ENCOUNTER — Telehealth: Payer: Self-pay | Admitting: Medical

## 2021-12-30 NOTE — Telephone Encounter (Signed)
Phone Number: [phone] ?

## 2021-12-30 NOTE — Telephone Encounter (Signed)
Dr. Alfonse Alpers called with a few questions for [facility] regarding psych disability. Per Teams chat, [name] said he would call him back tomorrow morning. ?

## 2021-12-31 ENCOUNTER — Telehealth: Payer: Self-pay | Admitting: Medical

## 2021-12-31 NOTE — Telephone Encounter (Signed)
Talked with Dr. Theadora Rama today and summarized pt recent presentation. Encouraged him to get copies of last 4 visit notes. Also notified pt after call of our conversation.  ? ?Esperanza Richters, PA-C  ?

## 2022-01-12 ENCOUNTER — Other Ambulatory Visit: Payer: Self-pay | Admitting: Medical

## 2022-01-13 ENCOUNTER — Telehealth: Payer: Self-pay | Admitting: *Deleted

## 2022-01-13 NOTE — Telephone Encounter (Signed)
Pt notified that medication was sent previous day and pharmacy ?

## 2022-01-13 NOTE — Telephone Encounter (Signed)
Who Is Calling Patient / Member / Family / Caregiver ?Caller Name Anav Lammert ?Caller Phone Number [phone] ?Patient Name Walter Nichols ?Patient DOB 1971-07-05 ?Call Type Message Only Information Provided ?Reason for Call Request for General Office Information ?Initial Comment Caller states he is needing a refill on his sleeping machine. ?Disp. Time Disposition Final User ?01/12/2022 7:11:01 PM General Information Provided Yes Bjorn Loser ?

## 2022-01-19 ENCOUNTER — Other Ambulatory Visit: Payer: Self-pay | Admitting: Medical

## 2022-01-19 NOTE — Addendum Note (Signed)
Addended by: Anabel Halon on: 01/19/2022 10:55 AM ? ? Modules accepted: Orders ? ?

## 2022-01-19 NOTE — Telephone Encounter (Signed)
Pt has follow up 5/10 , can get labs done after appt ?

## 2022-01-19 NOTE — Telephone Encounter (Signed)
Would you like Pt to continue Ergocalciferol?  

## 2022-01-20 ENCOUNTER — Other Ambulatory Visit: Payer: Self-pay | Admitting: Medical

## 2022-01-20 ENCOUNTER — Telehealth (INDEPENDENT_AMBULATORY_CARE_PROVIDER_SITE_OTHER): Payer: 59 | Admitting: Medical

## 2022-01-20 DIAGNOSIS — F329 Major depressive disorder, single episode, unspecified: Secondary | ICD-10-CM

## 2022-01-20 DIAGNOSIS — G4700 Insomnia, unspecified: Secondary | ICD-10-CM

## 2022-01-20 DIAGNOSIS — F419 Anxiety disorder, unspecified: Secondary | ICD-10-CM | POA: Diagnosis not present

## 2022-01-20 DIAGNOSIS — F1911 Other psychoactive substance abuse, in remission: Secondary | ICD-10-CM | POA: Diagnosis not present

## 2022-01-20 MED ORDER — VENLAFAXINE HCL ER 150 MG PO CP24: 150.0000 mg | ORAL_CAPSULE | Freq: Every day | ORAL | 11 refills | Status: AC

## 2022-01-20 NOTE — Patient Instructions (Addendum)
Recent reactive depression after sad news related to son. In addition anxiety and insomnia. ? ?Continue your buspar and trazadone. ? ?If you have thoughts of harm to self or others be seen at ED at [facility]. ? ?For abdomen pain seems related to your mood. If abdomen pain worsens then need to see you in office to work up. Could try pepcid over the counter. If severe abd pain then be seen in the ED. ? ?Please verify that you are seeing the psychiatrist in June. Let me know if further delay. ? ?Follow up in 1 month or sooner if needed ?
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
## 2022-02-15 ENCOUNTER — Telehealth (INDEPENDENT_AMBULATORY_CARE_PROVIDER_SITE_OTHER): Payer: 59 | Admitting: Medical

## 2022-02-15 DIAGNOSIS — R1030 Lower abdominal pain, unspecified: Secondary | ICD-10-CM | POA: Diagnosis not present

## 2022-02-15 DIAGNOSIS — G44209 Tension-type headache, unspecified, not intractable: Secondary | ICD-10-CM

## 2022-02-15 MED ORDER — CYCLOBENZAPRINE HCL 5 MG PO TABS: ORAL_TABLET | ORAL | 0 refills | Status: DC

## 2022-02-15 MED ORDER — FAMOTIDINE 20 MG PO TABS: 20.0000 mg | ORAL_TABLET | Freq: Every day | ORAL | 0 refills | Status: DC

## 2022-02-15 NOTE — Patient Instructions (Addendum)
Depression and anxiety. Continue effexor, buspar and trazadone.  For abdomen pain rx famotadine. Get cbc, cmp, lipase and do ifob test asap. Friday if you just can't make it to our office before then.  Will fill out paperwork with modified days off march 12 th- march 31st.   Follow up in 10 days in office with or sooner if needed.
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
## 2022-02-17 ENCOUNTER — Telehealth: Payer: Self-pay | Admitting: Medical

## 2022-02-17 NOTE — Telephone Encounter (Signed)
Pt stated for his fmla papers it needs to be eft from 3/12-3/31 and faxed to [phone]. He stated it needs to include his id number [id number] and his lead [id number].

## 2022-02-17 NOTE — Telephone Encounter (Signed)
Forms faxed 12/21/21 with edits  Forms faxed again today

## 2022-02-19 ENCOUNTER — Other Ambulatory Visit: Payer: 59

## 2022-03-22 ENCOUNTER — Other Ambulatory Visit: Payer: Self-pay | Admitting: Medical

## 2022-04-04 ENCOUNTER — Other Ambulatory Visit: Payer: Self-pay | Admitting: Medical

## 2022-05-03 ENCOUNTER — Ambulatory Visit (INDEPENDENT_AMBULATORY_CARE_PROVIDER_SITE_OTHER): Payer: 59 | Admitting: Family Medicine

## 2022-05-03 ENCOUNTER — Encounter: Payer: Self-pay | Admitting: Family Medicine

## 2022-05-03 VITALS — BP 132/96 | HR 78 | Temp 97.8°F | Resp 18 | Ht 74.0 in | Wt 276.0 lb

## 2022-05-03 DIAGNOSIS — K625 Hemorrhage of anus and rectum: Secondary | ICD-10-CM | POA: Diagnosis not present

## 2022-05-03 DIAGNOSIS — Z8719 Personal history of other diseases of the digestive system: Secondary | ICD-10-CM

## 2022-05-03 DIAGNOSIS — S39012A Strain of muscle, fascia and tendon of lower back, initial encounter: Secondary | ICD-10-CM | POA: Insufficient documentation

## 2022-05-03 LAB — COMPREHENSIVE METABOLIC PANEL
ALT: 21 U/L (ref 0–53)
AST: 19 U/L (ref 0–37)
Albumin: 4.7 g/dL (ref 3.5–5.2)
Alkaline Phosphatase: 81 U/L (ref 39–117)
BUN: 10 mg/dL (ref 6–23)
CO2: 24 mEq/L (ref 19–32)
Calcium: 10 mg/dL (ref 8.4–10.5)
Chloride: 104 mEq/L (ref 96–112)
Creatinine, Ser: 1.19 mg/dL (ref 0.40–1.50)
GFR: 71.58 mL/min (ref >60.00)
Glucose, Bld: 117 mg/dL — ABNORMAL HIGH (ref 70–99)
Potassium: 4.4 mEq/L (ref 3.5–5.1)
Sodium: 139 mEq/L (ref 135–145)
Total Bilirubin: 0.6 mg/dL (ref 0.2–1.2)
Total Protein: 7.4 g/dL (ref 6.0–8.3)

## 2022-05-03 LAB — CBC WITH DIFFERENTIAL/PLATELET
Basophils Absolute: 0.1 10*3/uL (ref 0.0–0.1)
Basophils Relative: 0.9 % (ref 0.0–3.0)
Eosinophils Absolute: 0.3 10*3/uL (ref 0.0–0.7)
Eosinophils Relative: 4.7 % (ref 0.0–5.0)
HCT: 43 % (ref 39.0–52.0)
Hemoglobin: 14.2 g/dL (ref 13.0–17.0)
Lymphocytes Relative: 30.2 % (ref 12.0–46.0)
Lymphs Abs: 2.1 10*3/uL (ref 0.7–4.0)
MCHC: 32.9 g/dL (ref 30.0–36.0)
MCV: 86 fl (ref 78.0–100.0)
Monocytes Absolute: 0.6 10*3/uL (ref 0.1–1.0)
Monocytes Relative: 8.9 % (ref 3.0–12.0)
Neutro Abs: 3.8 10*3/uL (ref 1.4–7.7)
Neutrophils Relative %: 55.3 % (ref 43.0–77.0)
Platelets: 387 10*3/uL (ref 150.0–400.0)
RBC: 5.01 Mil/uL (ref 4.22–5.81)
RDW: 14 % (ref 11.5–15.5)
WBC: 6.9 10*3/uL (ref 4.0–10.5)

## 2022-05-03 LAB — POC URINALSYSI DIPSTICK (AUTOMATED)
Bilirubin, UA: NEGATIVE
Blood, UA: NEGATIVE
Glucose, UA: NEGATIVE
Ketones, UA: NEGATIVE
Leukocytes, UA: NEGATIVE
Nitrite, UA: NEGATIVE
Protein, UA: POSITIVE — AB
Spec Grav, UA: 1.02 (ref 1.010–1.025)
Urobilinogen, UA: 0.2 E.U./dL
pH, UA: 6 (ref 5.0–8.0)

## 2022-05-03 MED ORDER — CYCLOBENZAPRINE HCL 5 MG PO TABS: ORAL_TABLET | ORAL | 0 refills | Status: AC

## 2022-05-03 MED ORDER — MELOXICAM 15 MG PO TABS: 15.0000 mg | ORAL_TABLET | Freq: Every day | ORAL | 0 refills | Status: DC

## 2022-05-03 MED ORDER — HYDROCORTISONE ACETATE 25 MG RE SUPP: 25.0000 mg | Freq: Two times a day (BID) | RECTAL | 0 refills | Status: DC

## 2022-05-03 NOTE — Assessment & Plan Note (Signed)
Muscle relaxer  Antiinflammatory Ice/ heat  F/u pcp 2 weeks or sooner prn

## 2022-05-03 NOTE — Progress Notes (Signed)
Subjective:   By signing my name below, I, Carylon Perches, attest that this documentation has been prepared under the direction and in the presence of Ann Held DO 05/03/2022   Patient ID: Walter Nichols, male    DOB: 01/29/1971, 51 y.o.   MRN: [id number]  Chief Complaint  Patient presents with   Back Pain    Back right, Pt states moving his parents about week ago. Pt states moving and lifting heavy furniture.     HPI Patient is in today for an office visit  He complains of lower right back pain. He had to move furniture from his father's house with the help of other movers. He denies of any pain radiating down his leg. He states that pain elevates when he is sitting down. He reports that pain is 4/10 but occasionally increases to a 7/10 when performing certain movements. He does not work out as much as he used to. He states that he does not currently have his prescription of 5 Mg of Cyclobenzaprine HCl.   He complains of a possible fissure. He reports that he has had two hemorrhoid surgeries in the past. He denies of any hemorrhoids at this current moment. Since his initial bleeding, he has not bled since but reports that he has minor leakage.   He is requesting to have paperwork filled for leave at his job   He reports that he is undergoing multiple familial stressors. His daughter currently does not live with him and his father has recently moved in with him.   Past Medical History:  Diagnosis Date   Alcoholism ([place])    Anal fissure    Anxiety    Depression    Hypertension    Migraine     Past Surgical History:  Procedure Laterality Date   HEMORROIDECTOMY     x2    Family History  Problem Relation Age of Onset   Lung cancer Maternal Grandmother    Colon cancer Neg Hx    Esophageal cancer Neg Hx    Rectal cancer Neg Hx    Stomach cancer Neg Hx     Social History   Socioeconomic History   Marital status: Single    Spouse name: Not on file   Number of  children: Not on file   Years of education: Not on file   Highest education level: Not on file  Occupational History   Not on file  Tobacco Use   Smoking status: Every Day    Packs/day: 0.50    Types: Cigarettes   Smokeless tobacco: Never  Vaping Use   Vaping Use: Never used  Substance and Sexual Activity   Alcohol use: Not Currently    Comment: 40 oz a day/ocassionally    Drug use: No    Comment: in the past used coccaine. 3 yrs ago.   Sexual activity: Never  Other Topics Concern   Not on file  Social History Narrative   Not on file   Social Determinants of Health   Financial Resource Strain: Not on file  Food Insecurity: Not on file  Transportation Needs: Not on file  Physical Activity: Not on file  Stress: Not on file  Social Connections: Not on file  Intimate Partner Violence: Not on file    Outpatient Medications Prior to Visit  Medication Sig Dispense Refill   AMBULATORY NON FORMULARY MEDICATION Nitroglycerin ointment 0.125% apply BID rectally for 6-8 weeks 30 g 0   amLODipine (NORVASC) 10 MG  tablet TAKE 1 TABLET BY MOUTH EVERY DAY 90 tablet 3   ASPIRIN 81 PO Take 1 tablet by mouth daily.     atorvastatin (LIPITOR) 10 MG tablet TAKE 1 TABLET BY MOUTH EVERY DAY 90 tablet 1   atorvastatin (LIPITOR) 10 MG tablet TAKE 1 TABLET BY MOUTH EVERY DAY 90 tablet 1   busPIRone (BUSPAR) 15 MG tablet Take 1 tablet (15 mg total) by mouth 3 (three) times daily. 90 tablet 1   Cyanocobalamin (BL VITAMIN B-12 PO) Take 1 tablet by mouth daily.     famotidine (PEPCID) 20 MG tablet TAKE 1 TABLET BY MOUTH EVERY DAY 90 tablet 1   fenofibrate (TRICOR) 48 MG tablet TAKE 1 TABLET BY MOUTH EVERY DAY 90 tablet 1   losartan (COZAAR) 25 MG tablet TAKE 1 TABLET (25 MG TOTAL) BY MOUTH DAILY. 90 tablet 2   Multiple Vitamins-Minerals (MULTIVITAMIN WITH MINERALS) tablet Take 1 tablet by mouth as needed (3 times a week. 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      sildenafil (VIAGRA) 50 MG tablet 1 tab po as  needed one hour prior to sex 10 tablet 0   traZODone (DESYREL) 50 MG tablet TAKE 1/2 TAB AT NIGHT 45 tablet 1   triamcinolone (NASACORT) 55 MCG/ACT AERO nasal inhaler as needed.     venlafaxine XR (EFFEXOR XR) 150 MG 24 hr capsule Take 1 capsule (150 mg total) by mouth daily with breakfast. 30 capsule 11   VITAMIN A PO Take 1 tablet by mouth daily.     VITAMIN D PO Take 1 tablet by mouth daily.     Vitamin D, Ergocalciferol, (DRISDOL) 1.25 MG (50000 UNIT) CAPS capsule TAKE 1 CAPSULE (50,000 UNITS TOTAL) BY MOUTH EVERY 7 (SEVEN) DAYS 4 capsule 1   VITAMIN E PO Take 1 tablet by mouth daily.     cyclobenzaprine (FLEXERIL) 5 MG tablet 1 tab po q hs prn tension ha 7 tablet 0   No facility-administered medications prior to visit.    No Known Allergies  Review of Systems  Constitutional:  Negative for fever and malaise/fatigue.  HENT:  Negative for congestion.   Eyes:  Negative for blurred vision.  Respiratory:  Negative for shortness of breath.   Cardiovascular:  Negative for chest pain, palpitations and leg swelling.  Gastrointestinal:  Positive for blood in stool. Negative for abdominal pain and nausea.  Genitourinary:  Negative for dysuria and frequency.       (+) Leakage in Anus  (+) Bleeding in Anus  Musculoskeletal:  Positive for back pain (Lower Right). Negative for falls.  Skin:  Negative for rash.  Neurological:  Negative for dizziness, loss of consciousness and headaches.  Endo/Heme/Allergies:  Negative for environmental allergies.  Psychiatric/Behavioral:  Negative for depression. The patient is not nervous/anxious.        Objective:    Physical Exam Vitals and nursing note reviewed.  Constitutional:      General: He is not in acute distress.    Appearance: Normal appearance. He is not ill-appearing.  HENT:     Head: Normocephalic and atraumatic.     Right Ear: External ear normal.     Left Ear: External ear normal.  Eyes:     Extraocular Movements: Extraocular  movements intact.     Pupils: Pupils are equal, round, and reactive to light.  Cardiovascular:     Rate and Rhythm: Normal rate and regular rhythm.     Heart sounds: Normal heart sounds. No murmur heard.  No gallop.  Pulmonary:     Effort: Pulmonary effort is normal. No respiratory distress.     Breath sounds: Normal breath sounds. No wheezing or rales.  Musculoskeletal:        General: Tenderness present. No swelling, deformity or signs of injury. Normal range of motion.     Right lower leg: No edema.     Left lower leg: No edema.     Comments: 5/5 strength in lower extremities    Skin:    General: Skin is warm and dry.  Neurological:     General: No focal deficit present.     Mental Status: He is alert and oriented to person, place, and time.     Sensory: No sensory deficit.     Motor: No weakness.     Coordination: Coordination normal.     Gait: Gait normal.     Deep Tendon Reflexes: Reflexes normal.  Psychiatric:        Judgment: Judgment normal.     BP (!) 132/96 (BP Location: Left Arm, Patient Position: Sitting, Cuff Size: Normal)   Pulse 78   Temp 97.8 F (36.6 C) (Oral)   Resp 18   Ht 6\' 2"  (1.88 m)   Wt 276 lb (125.2 kg)   SpO2 96%   BMI 35.44 kg/m  Wt Readings from Last 3 Encounters:  05/03/22 276 lb (125.2 kg)  11/17/21 276 lb (125.2 kg)  11/10/21 273 lb (123.8 kg)    Diabetic Foot Exam - Simple   No data filed    Lab Results  Component Value Date   WBC 6.4 09/15/2021   HGB 13.9 09/15/2021   HCT 42.1 09/15/2021   PLT 336.0 09/15/2021   GLUCOSE 104 (H) 09/15/2021   CHOL 203 (H) 09/15/2021   TRIG (H) 09/15/2021    424.0 Triglyceride is over 400; calculations on Lipids are invalid.   HDL 28.00 (L) 09/15/2021   LDLDIRECT 122.0 09/15/2021   LDLCALC 95 01/19/2019   ALT 45 09/15/2021   AST 30 09/15/2021   NA 137 09/15/2021   K 4.2 09/15/2021   CL 106 09/15/2021   CREATININE 1.18 09/15/2021   BUN 10 09/15/2021   CO2 23 09/15/2021   TSH 2.45  01/28/2016   PSA 3.89 10/14/2020   HGBA1C 6.0 10/14/2020    Lab Results  Component Value Date   TSH 2.45 01/28/2016   Lab Results  Component Value Date   WBC 6.4 09/15/2021   HGB 13.9 09/15/2021   HCT 42.1 09/15/2021   MCV 87.0 09/15/2021   PLT 336.0 09/15/2021   Lab Results  Component Value Date   NA 137 09/15/2021   K 4.2 09/15/2021   CO2 23 09/15/2021   GLUCOSE 104 (H) 09/15/2021   BUN 10 09/15/2021   CREATININE 1.18 09/15/2021   BILITOT 0.3 09/15/2021   ALKPHOS 70 09/15/2021   AST 30 09/15/2021   ALT 45 09/15/2021   PROT 7.2 09/15/2021   ALBUMIN 4.4 09/15/2021   CALCIUM 9.5 09/15/2021   ANIONGAP 6 08/02/2017   GFR 72.63 09/15/2021   Lab Results  Component Value Date   CHOL 203 (H) 09/15/2021   Lab Results  Component Value Date   HDL 28.00 (L) 09/15/2021   Lab Results  Component Value Date   LDLCALC 95 01/19/2019   Lab Results  Component Value Date   TRIG (H) 09/15/2021    424.0 Triglyceride is over 400; calculations on Lipids are invalid.   Lab Results  Component Value  Date   CHOLHDL 7 09/15/2021   Lab Results  Component Value Date   HGBA1C 6.0 10/14/2020       Assessment & Plan:   Problem List Items Addressed This Visit       Unprioritized   Strain of lumbar region - Primary    Muscle relaxer  Antiinflammatory Ice/ heat  F/u pcp 2 weeks or sooner prn       Relevant Medications   cyclobenzaprine (FLEXERIL) 5 MG tablet   meloxicam (MOBIC) 15 MG tablet   Other Visit Diagnoses     History of anal fissures       Relevant Medications   hydrocortisone (ANUSOL-HC) 25 MG suppository   Other Relevant Orders   Ambulatory referral to Gastroenterology   CBC with Differential/Platelet   Comprehensive metabolic panel   POCT Urinalysis Dipstick (Automated) (Completed)   Rectal bleed       Relevant Orders   Ambulatory referral to Gastroenterology   CBC with Differential/Platelet   Comprehensive metabolic panel   POCT Urinalysis  Dipstick (Automated) (Completed)      Meds ordered this encounter  Medications   hydrocortisone (ANUSOL-HC) 25 MG suppository    Sig: Place 1 suppository (25 mg total) rectally 2 (two) times daily.    Dispense:  12 suppository    Refill:  0   cyclobenzaprine (FLEXERIL) 5 MG tablet    Sig: 1 tab po q hs prn tension ha    Dispense:  7 tablet    Refill:  0   meloxicam (MOBIC) 15 MG tablet    Sig: Take 1 tablet (15 mg total) by mouth daily.    Dispense:  30 tablet    Refill:  0    I, Donato Schultz, DO, personally preformed the services described in this documentation.  All medical record entries made by the scribe were at my direction and in my presence.  I have reviewed the chart and discharge instructions (if applicable) and agree that the record reflects my personal performance and is accurate and complete. 05/03/2022   I,Amber Collins,acting as a scribe for Donato Schultz, DO.,have documented all relevant documentation on the behalf of Donato Schultz, DO,as directed by  Donato Schultz, DO while in the presence of Donato Schultz, DO.    Donato Schultz, DO

## 2022-05-03 NOTE — Patient Instructions (Signed)
Low Back Sprain or Strain Rehab Ask your health care provider which exercises are safe for you. Do exercises exactly as told by your health care provider and adjust them as directed. It is normal to feel mild stretching, pulling, tightness, or discomfort as you do these exercises. Stop right away if you feel sudden pain or your pain gets worse. Do not begin these exercises until told by your health care provider. Stretching and range-of-motion exercises These exercises warm up your muscles and joints and improve the movement and flexibility of your back. These exercises also help to relieve pain, numbness, and tingling. Lumbar rotation  Lie on your back on a firm bed or the floor with your knees bent. Straighten your arms out to your sides so each arm forms a 90-degree angle (right angle) with a side of your body. Slowly move (rotate) both of your knees to one side of your body until you feel a stretch in your lower back (lumbar). Try not to let your shoulders lift off the floor. Hold this position for __________ seconds. Tense your abdominal muscles and slowly move your knees back to the starting position. Repeat this exercise on the other side of your body. Repeat __________ times. Complete this exercise __________ times a day. Single knee to chest  Lie on your back on a firm bed or the floor with both legs straight. Bend one of your knees. Use your hands to move your knee up toward your chest until you feel a gentle stretch in your lower back and buttock. Hold your leg in this position by holding on to the front of your knee. Keep your other leg as straight as possible. Hold this position for __________ seconds. Slowly return to the starting position. Repeat with your other leg. Repeat __________ times. Complete this exercise __________ times a day. Prone extension on elbows  Lie on your abdomen on a firm bed or the floor (prone position). Prop yourself up on your elbows. Use your arms  to help lift your chest up until you feel a gentle stretch in your abdomen and your lower back. This will place some of your body weight on your elbows. If this is uncomfortable, try stacking pillows under your chest. Your hips should stay down, against the surface that you are lying on. Keep your hip and back muscles relaxed. Hold this position for __________ seconds. Slowly relax your upper body and return to the starting position. Repeat __________ times. Complete this exercise __________ times a day. Strengthening exercises These exercises build strength and endurance in your back. Endurance is the ability to use your muscles for a long time, even after they get tired. Pelvic tilt This exercise strengthens the muscles that lie deep in the abdomen. Lie on your back on a firm bed or the floor with your legs extended. Bend your knees so they are pointing toward the ceiling and your feet are flat on the floor. Tighten your lower abdominal muscles to press your lower back against the floor. This motion will tilt your pelvis so your tailbone points up toward the ceiling instead of pointing to your feet or the floor. To help with this exercise, you may place a small towel under your lower back and try to push your back into the towel. Hold this position for __________ seconds. Let your muscles relax completely before you repeat this exercise. Repeat __________ times. Complete this exercise __________ times a day. Alternating arm and leg raises  Get on your hands   and knees on a firm surface. If you are on a hard floor, you may want to use padding, such as an exercise mat, to cushion your knees. Line up your arms and legs. Your hands should be directly below your shoulders, and your knees should be directly below your hips. Lift your left leg behind you. At the same time, raise your right arm and straighten it in front of you. Do not lift your leg higher than your hip. Do not lift your arm higher  than your shoulder. Keep your abdominal and back muscles tight. Keep your hips facing the ground. Do not arch your back. Keep your balance carefully, and do not hold your breath. Hold this position for __________ seconds. Slowly return to the starting position. Repeat with your right leg and your left arm. Repeat __________ times. Complete this exercise __________ times a day. Abdominal set with straight leg raise  Lie on your back on a firm bed or the floor. Bend one of your knees and keep your other leg straight. Tense your abdominal muscles and lift your straight leg up, 4-6 inches (10-15 cm) off the ground. Keep your abdominal muscles tight and hold this position for __________ seconds. Do not hold your breath. Do not arch your back. Keep it flat against the ground. Keep your abdominal muscles tense as you slowly lower your leg back to the starting position. Repeat with your other leg. Repeat __________ times. Complete this exercise __________ times a day. Single leg lower with bent knees Lie on your back on a firm bed or the floor. Tense your abdominal muscles and lift your feet off the floor, one foot at a time, so your knees and hips are bent in 90-degree angles (right angles). Your knees should be over your hips and your lower legs should be parallel to the floor. Keeping your abdominal muscles tense and your knee bent, slowly lower one of your legs so your toe touches the ground. Lift your leg back up to return to the starting position. Do not hold your breath. Do not let your back arch. Keep your back flat against the ground. Repeat with your other leg. Repeat __________ times. Complete this exercise __________ times a day. Posture and body mechanics Good posture and healthy body mechanics can help to relieve stress in your body's tissues and joints. Body mechanics refers to the movements and positions of your body while you do your daily activities. Posture is part of body  mechanics. Good posture means: Your spine is in its natural S-curve position (neutral). Your shoulders are pulled back slightly. Your head is not tipped forward (neutral). Follow these guidelines to improve your posture and body mechanics in your everyday activities. Standing  When standing, keep your spine neutral and your feet about hip-width apart. Keep a slight bend in your knees. Your ears, shoulders, and hips should line up. When you do a task in which you stand in one place for a long time, place one foot up on a stable object that is 2-4 inches (5-10 cm) high, such as a footstool. This helps keep your spine neutral. Sitting  When sitting, keep your spine neutral and keep your feet flat on the floor. Use a footrest, if necessary, and keep your thighs parallel to the floor. Avoid rounding your shoulders, and avoid tilting your head forward. When working at a desk or a computer, keep your desk at a height where your hands are slightly lower than your elbows. Slide your   chair under your desk so you are close enough to maintain good posture. When working at a computer, place your monitor at a height where you are looking straight ahead and you do not have to tilt your head forward or downward to look at the screen. Resting When lying down and resting, avoid positions that are most painful for you. If you have pain with activities such as sitting, bending, stooping, or squatting, lie in a position in which your body does not bend very much. For example, avoid curling up on your side with your arms and knees near your chest (fetal position). If you have pain with activities such as standing for a long time or reaching with your arms, lie with your spine in a neutral position and bend your knees slightly. Try the following positions: Lying on your side with a pillow between your knees. Lying on your back with a pillow under your knees. Lifting  When lifting objects, keep your feet at least  shoulder-width apart and tighten your abdominal muscles. Bend your knees and hips and keep your spine neutral. It is important to lift using the strength of your legs, not your back. Do not lock your knees straight out. Always ask for help to lift heavy or awkward objects. This information is not intended to replace advice given to you by your health care provider. Make sure you discuss any questions you have with your health care provider. Document Revised: 11/17/2020 Document Reviewed: 11/17/2020 Elsevier Patient Education  2023 [employer] Inc.  

## 2022-05-18 ENCOUNTER — Encounter: Payer: Self-pay | Admitting: Medical

## 2022-05-18 ENCOUNTER — Ambulatory Visit (INDEPENDENT_AMBULATORY_CARE_PROVIDER_SITE_OTHER): Payer: 59 | Admitting: Medical

## 2022-05-18 VITALS — BP 130/88 | HR 76 | Resp 18 | Ht 74.0 in | Wt 273.4 lb

## 2022-05-18 DIAGNOSIS — E785 Hyperlipidemia, unspecified: Secondary | ICD-10-CM

## 2022-05-18 DIAGNOSIS — F1910 Other psychoactive substance abuse, uncomplicated: Secondary | ICD-10-CM

## 2022-05-18 DIAGNOSIS — F419 Anxiety disorder, unspecified: Secondary | ICD-10-CM | POA: Diagnosis not present

## 2022-05-18 DIAGNOSIS — K6289 Other specified diseases of anus and rectum: Secondary | ICD-10-CM

## 2022-05-18 DIAGNOSIS — R739 Hyperglycemia, unspecified: Secondary | ICD-10-CM | POA: Diagnosis not present

## 2022-05-18 DIAGNOSIS — I10 Essential (primary) hypertension: Secondary | ICD-10-CM | POA: Diagnosis not present

## 2022-05-18 DIAGNOSIS — F329 Major depressive disorder, single episode, unspecified: Secondary | ICD-10-CM

## 2022-05-18 LAB — COMPREHENSIVE METABOLIC PANEL
ALT: 77 U/L — ABNORMAL HIGH (ref 0–53)
AST: 191 U/L — ABNORMAL HIGH (ref 0–37)
Albumin: 4.3 g/dL (ref 3.5–5.2)
Alkaline Phosphatase: 80 U/L (ref 39–117)
BUN: 7 mg/dL (ref 6–23)
CO2: 26 mEq/L (ref 19–32)
Calcium: 9.7 mg/dL (ref 8.4–10.5)
Chloride: 107 mEq/L (ref 96–112)
Creatinine, Ser: 1.13 mg/dL (ref 0.40–1.50)
GFR: 76.15 mL/min (ref >60.00)
Glucose, Bld: 105 mg/dL — ABNORMAL HIGH (ref 70–99)
Potassium: 4.2 mEq/L (ref 3.5–5.1)
Sodium: 141 mEq/L (ref 135–145)
Total Bilirubin: 0.5 mg/dL (ref 0.2–1.2)
Total Protein: 7.2 g/dL (ref 6.0–8.3)

## 2022-05-18 LAB — LIPID PANEL
Cholesterol: 152 mg/dL (ref 0–200)
HDL: 34.1 mg/dL — ABNORMAL LOW (ref >39.00)
LDL Cholesterol: 79 mg/dL (ref 0–99)
NonHDL: 117.99
Total CHOL/HDL Ratio: 4
Triglycerides: 197 mg/dL — ABNORMAL HIGH (ref 0.0–149.0)
VLDL: 39.4 mg/dL (ref 0.0–40.0)

## 2022-05-18 LAB — HEMOGLOBIN A1C: Hgb A1c MFr Bld: 6.6 % — ABNORMAL HIGH (ref 4.6–6.5)
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
## 2022-05-18 NOTE — Patient Instructions (Addendum)
Anxiety, depression and substance abuse(mood and presently not abusing substances since 2 weeks ago.  Continue  effexor, buspar and trazadone. Do think staying AA or at least talking with friend who has gone thru/overcome same struggles.   Will fill out fmla form when sent to me. Can send copy of paperwork attached on my chart or drop off.  Rectal pain some improved after anusol supp. May have been hemorrhoid vs fissure. If pain not subsiding completely by another week let me know would refer to GI MD for evaluation/anoscopy.  Htn- bp better on recheck. Continue losartan and amlodipine.  Elevated sugar on labs. Will get a1c today.  For high cholesterol will get cmp and lipid panel today. Currently per med list on fenofibrate and atorvastatin.  Follow up date 3 months or sooner if needed.

## 2022-05-25 ENCOUNTER — Other Ambulatory Visit: Payer: Self-pay | Admitting: Family Medicine

## 2022-05-25 DIAGNOSIS — S39012A Strain of muscle, fascia and tendon of lower back, initial encounter: Secondary | ICD-10-CM

## 2022-06-02 ENCOUNTER — Telehealth: Payer: Self-pay | Admitting: Medical

## 2022-06-02 NOTE — Telephone Encounter (Signed)
Patient calling to check status on whether we received most recent forms from [facility]. They are telling him they faxed it. Please advise if we have received it/faxed it or if we have not so he can get [place] the forms. He has the forms in his email but was unable to upload to [employer]. He can get [place] the form if we have not received it yet. Patient stated it would be for intermittent leave for the rest of the year. He is home today.

## 2022-06-03 NOTE — Telephone Encounter (Signed)
Called pt and lvm to return call ,   Forms not received

## 2022-06-11 NOTE — Telephone Encounter (Signed)
Pt called to get a status update on forms and see if we have received them. He also wanted a call from [place] to discuss working from home.

## 2022-06-13 ENCOUNTER — Telehealth: Payer: Self-pay | Admitting: Medical

## 2022-06-13 NOTE — Telephone Encounter (Signed)
I just got form/got back in office on Jun 14 2022. Delay in him getting [place] the form. Will you looks at old copies and print. Also best for him to have office visit or video visit to a lot me time to fill out form this week.

## 2022-06-14 NOTE — Telephone Encounter (Signed)
Pt scheduled for mychart video visit 06/14/22

## 2022-06-15 ENCOUNTER — Telehealth (INDEPENDENT_AMBULATORY_CARE_PROVIDER_SITE_OTHER): Payer: 59 | Admitting: Medical

## 2022-06-15 DIAGNOSIS — I10 Essential (primary) hypertension: Secondary | ICD-10-CM | POA: Diagnosis not present

## 2022-06-15 DIAGNOSIS — F329 Major depressive disorder, single episode, unspecified: Secondary | ICD-10-CM | POA: Diagnosis not present

## 2022-06-15 DIAGNOSIS — E785 Hyperlipidemia, unspecified: Secondary | ICD-10-CM | POA: Diagnosis not present

## 2022-06-15 DIAGNOSIS — F419 Anxiety disorder, unspecified: Secondary | ICD-10-CM | POA: Diagnosis not present

## 2022-06-15 DIAGNOSIS — R739 Hyperglycemia, unspecified: Secondary | ICD-10-CM

## 2022-06-15 MED ORDER — TRAZODONE HCL 50 MG PO TABS: 25.0000 mg | ORAL_TABLET | Freq: Every evening | ORAL | 3 refills | Status: DC | PRN

## 2022-06-15 MED ORDER — BUSPIRONE HCL 15 MG PO TABS: 15.0000 mg | ORAL_TABLET | Freq: Three times a day (TID) | ORAL | 1 refills | Status: DC

## 2022-06-15 NOTE — Progress Notes (Unsigned)
Subjective:    Patient ID: Walter Nichols, male    DOB: [birth year], 51 y.o.   MRN: [id number]  HPI  Virtual Visit via Video Note  I connected with Decker Cogdell on 06/15/22 at  3:20 PM EDT by a video enabled telemedicine application and verified that I am speaking with the correct person using two identifiers.  Location: Patient: home Provider: office.   I discussed the limitations of evaluation and management by telemedicine and the availability of in person appointments. The patient expressed understanding and agreed to proceed.  History of Present Illness:   Filled out extensive fmla form.  Pt has struggling with anxiety mostly and frustration. Some  mild depressed mood. Worse with his dad living in house with him.  Pt also has fallen back in to substance abuse. Some coccain and alcohol use. He  Pt is buspar. Recently ran out early summer. Currently on effexor.   For insomnia need refill of trazadone.   Past Medical History:  Diagnosis Date   Alcoholism ([place])    Anal fissure    Anxiety    Depression    Hypertension    Migraine      Social History   Socioeconomic History   Marital status: Single    Spouse name: Not on file   Number of children: Not on file   Years of education: Not on file   Highest education level: Not on file  Occupational History   Not on file  Tobacco Use   Smoking status: Every Day    Packs/day: 0.50    Types: Cigarettes   Smokeless tobacco: Never  Vaping Use   Vaping Use: Never used  Substance and Sexual Activity   Alcohol use: Not Currently    Comment: 40 oz a day/ocassionally    Drug use: No    Comment: in the past used coccaine. 3 yrs ago.   Sexual activity: Never  Other Topics Concern   Not on file  Social History Narrative   Not on file   Social Determinants of Health   Financial Resource Strain: Not on file  Food Insecurity: Not on file  Transportation Needs: Not on file  Physical Activity: Not on file  Stress: Not on  file  Social Connections: Not on file  Intimate Partner Violence: Not on file    Past Surgical History:  Procedure Laterality Date   HEMORROIDECTOMY     x2    Family History  Problem Relation Age of Onset   Lung cancer Maternal Grandmother    Colon cancer Neg Hx    Esophageal cancer Neg Hx    Rectal cancer Neg Hx    Stomach cancer Neg Hx     No Known Allergies  Current Outpatient Medications on File Prior to Visit  Medication Sig Dispense Refill   AMBULATORY NON FORMULARY MEDICATION Nitroglycerin ointment 0.125% apply BID rectally for 6-8 weeks 30 g 0   amLODipine (NORVASC) 10 MG tablet TAKE 1 TABLET BY MOUTH EVERY DAY 90 tablet 3   ASPIRIN 81 PO Take 1 tablet by mouth daily.     atorvastatin (LIPITOR) 10 MG tablet TAKE 1 TABLET BY MOUTH EVERY DAY 90 tablet 1   atorvastatin (LIPITOR) 10 MG tablet TAKE 1 TABLET BY MOUTH EVERY DAY 90 tablet 1   Cyanocobalamin (BL VITAMIN B-12 PO) Take 1 tablet by mouth daily.     cyclobenzaprine (FLEXERIL) 5 MG tablet 1 tab po q hs prn tension ha 7 tablet 0   famotidine (  PEPCID) 20 MG tablet TAKE 1 TABLET BY MOUTH EVERY DAY 90 tablet 1   fenofibrate (TRICOR) 48 MG tablet TAKE 1 TABLET BY MOUTH EVERY DAY 90 tablet 1   hydrocortisone (ANUSOL-HC) 25 MG suppository Place 1 suppository (25 mg total) rectally 2 (two) times daily. 12 suppository 0   losartan (COZAAR) 25 MG tablet TAKE 1 TABLET (25 MG TOTAL) BY MOUTH DAILY. 90 tablet 2   meloxicam (MOBIC) 15 MG tablet Take 1 tablet (15 mg total) by mouth daily. 30 tablet 0   Multiple Vitamins-Minerals (MULTIVITAMIN WITH MINERALS) tablet Take 1 tablet by mouth as needed (3 times a week. 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      sildenafil (VIAGRA) 50 MG tablet 1 tab po as needed one hour prior to sex 10 tablet 0   traZODone (DESYREL) 50 MG tablet TAKE 1/2 TAB AT NIGHT 45 tablet 1   triamcinolone (NASACORT) 55 MCG/ACT AERO nasal inhaler as needed.     venlafaxine XR (EFFEXOR XR) 150 MG 24 hr capsule Take 1 capsule  (150 mg total) by mouth daily with breakfast. 30 capsule 11   VITAMIN A PO Take 1 tablet by mouth daily.     VITAMIN D PO Take 1 tablet by mouth daily.     Vitamin D, Ergocalciferol, (DRISDOL) 1.25 MG (50000 UNIT) CAPS capsule TAKE 1 CAPSULE (50,000 UNITS TOTAL) BY MOUTH EVERY 7 (SEVEN) DAYS 4 capsule 1   VITAMIN E PO Take 1 tablet by mouth daily.     No current facility-administered medications on file prior to visit.    There were no vitals taken for this visit.    Observations/Objective: General-no acute distress, pleasant, oriented. Lungs- on inspection lungs appear unlabored. Neck- no tracheal deviation or jvd on inspection. Neuro- gross motor function appears intact.   Assessment and Plan:   Follow Up Instructions:    I discussed the assessment and treatment plan with the patient. The patient was provided an opportunity to ask questions and all were answered. The patient agreed with the plan and demonstrated an understanding of the instructions.   The patient was advised to call back or seek an in-person evaluation if the symptoms worsen or if the condition fails to improve as anticipated.     Esperanza Richters, PA-C   Review of Systems  Constitutional:  Negative for chills and fatigue.  Respiratory:  Negative for cough, chest tightness, shortness of breath and wheezing.   Cardiovascular:  Negative for chest pain and palpitations.  Gastrointestinal:  Negative for abdominal pain.  Musculoskeletal:  Negative for back pain.  Neurological:  Negative for dizziness, light-headedness and headaches.  Hematological:  Negative for adenopathy. Does not bruise/bleed easily.  Psychiatric/Behavioral:  Positive for dysphoric mood and sleep disturbance. Negative for behavioral problems and suicidal ideas. The patient is nervous/anxious.        Objective:   Physical Exam        Assessment & Plan:

## 2022-06-16 NOTE — Patient Instructions (Addendum)
Anxiety, mild depression and substance abuse.  Anxiety is much worse than her mood.  Recently with persisting family stressors patient with recurrent cocaine and alcohol use again.  Counseled to discontinue use of both as he has successfully done in the past.  Refilled BuSpar as he had run out of that prescription.  Continue Effexor.  Also refilled trazodone but stressed to use half dose only.  If anxiety were to worsen might consider providing low-dose clonazepam as backup to BuSpar.  Diabetes-expressed low sugar diet, stop alcohol use and recommend getting back into the gym to exercise.  Elevated triglycerides and low HDL.  With diabetes important to eat low-cholesterol diet and continue current atorvastatin along with fenofibrate.  Hypertension-continue losartan.  Please check your blood pressure and send me update by MyChart.  Filled out complicated FMLA form today.  Also will be writing work note requesting that he be able to work from home.   Follow-up in 3 weeks or sooner if needed.

## 2022-06-17 ENCOUNTER — Telehealth: Payer: Self-pay | Admitting: Medical

## 2022-06-17 NOTE — Telephone Encounter (Signed)
Letter faxed.

## 2022-06-17 NOTE — Telephone Encounter (Signed)
Pt called stating that he needed that Medical Accomodation forms for him to work from home sent to the following fax number:  [phone]

## 2022-06-23 NOTE — Telephone Encounter (Signed)
Patient called to advise that he is having to do another form with about 3 questions for him to have the medical accommodations to work from home. Patient said he will fax the form to [place] as soon as possible. 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 He can be reached at [phone]

## 2022-07-03 ENCOUNTER — Other Ambulatory Visit: Payer: Self-pay | Admitting: Medical

## 2022-07-08 ENCOUNTER — Other Ambulatory Visit: Payer: Self-pay | Admitting: Medical

## 2022-07-09 ENCOUNTER — Ambulatory Visit (HOSPITAL_BASED_OUTPATIENT_CLINIC_OR_DEPARTMENT_OTHER)
Admission: RE | Admit: 2022-07-09 | Discharge: 2022-07-09 | Disposition: A | Discharge status: Home / Self Care | Payer: 59 | Source: Ambulatory Visit | Attending: Medical | Admitting: Medical

## 2022-07-09 ENCOUNTER — Ambulatory Visit (INDEPENDENT_AMBULATORY_CARE_PROVIDER_SITE_OTHER): Payer: 59 | Admitting: Medical

## 2022-07-09 ENCOUNTER — Encounter: Payer: Self-pay | Admitting: Medical

## 2022-07-09 ENCOUNTER — Telehealth: Payer: Self-pay

## 2022-07-09 VITALS — BP 139/80 | HR 60 | Temp 98.0°F | Resp 18 | Ht 74.0 in | Wt 272.4 lb

## 2022-07-09 DIAGNOSIS — M25562 Pain in left knee: Secondary | ICD-10-CM

## 2022-07-09 DIAGNOSIS — F329 Major depressive disorder, single episode, unspecified: Secondary | ICD-10-CM | POA: Diagnosis not present

## 2022-07-09 DIAGNOSIS — M79609 Pain in unspecified limb: Secondary | ICD-10-CM

## 2022-07-09 DIAGNOSIS — F419 Anxiety disorder, unspecified: Secondary | ICD-10-CM

## 2022-07-09 DIAGNOSIS — I10 Essential (primary) hypertension: Secondary | ICD-10-CM

## 2022-07-09 DIAGNOSIS — F1910 Other psychoactive substance abuse, uncomplicated: Secondary | ICD-10-CM | POA: Diagnosis not present

## 2022-07-09 NOTE — Patient Instructions (Signed)
Anxiety, mild depression and substance abuse.  continue buspar, effexor and trazadone. Work Diplomatic Services operational officer note as your requested.   Diabetes-expressed low sugar diet, stop alcohol use and recommend getting back into the gym to exercise. Last a1c was 6.6.   Elevated triglycerides and low HDL.  With diabetes important to eat low-cholesterol diet and continue current atorvastatin along with fenofibrate. Get labs today.   Hypertension-continue losartan  Left knee/popliteal pain. Will get [place] to see if you have baker cyst and go ahead and get knee xray.   Follow up date to be determined after lab and imaging review.

## 2022-07-09 NOTE — Progress Notes (Signed)
Subjective:    Patient ID: Walter Nichols, male    DOB: 12-Jun-1971, 51 y.o.   MRN: [id number]  HPI  Pt states he hurt his left knee just walking up steps. He states some anterior left knee pain with popliteal pain as well.  Pt still continues to struggle with desire to use coccaine/substances.   Pt has anxiety, depression and ptsd. Pt did talk with counselor.   Pt has elevated triglycerides and low HDL. Pt is on tricor and atorvastatin.   Htn- pt on amlodipine 10 mg daily and losartan 25 mg daily.  Pt wants work from [employer]. I already gave him a letter. Now his work wants more detailed explanation. His counselor thinks he need to go work from home twice a week. But some benefit for him to be at home.     Review of Systems  Respiratory:  Negative for cough, choking, chest tightness, shortness of breath and wheezing.   Cardiovascular:  Negative for chest pain and palpitations.  Gastrointestinal:  Negative for abdominal pain.  Genitourinary:  Negative for dysuria, flank pain and frequency.  Musculoskeletal:  Negative for back pain and joint swelling.       Left knee pain.  Skin:  Negative for rash.  Neurological:  Negative for facial asymmetry, speech difficulty, weakness and light-headedness.  Psychiatric/Behavioral:  Positive for dysphoric mood. Negative for sleep disturbance. The patient is nervous/anxious.     Past Medical History:  Diagnosis Date   Alcoholism ([place])    Anal fissure    Anxiety    Depression    Hypertension    Migraine      Social History   Socioeconomic History   Marital status: Single    Spouse name: Not on file   Number of children: Not on file   Years of education: Not on file   Highest education level: Not on file  Occupational History   Not on file  Tobacco Use   Smoking status: Every Day    Packs/day: 0.50    Types: Cigarettes   Smokeless tobacco: Never  Vaping Use   Vaping Use: Never used  Substance and Sexual Activity    Alcohol use: Not Currently    Comment: 40 oz a day/ocassionally    Drug use: No    Comment: in the past used coccaine. 3 yrs ago.   Sexual activity: Never  Other Topics Concern   Not on file  Social History Narrative   Not on file   Social Determinants of Health   Financial Resource Strain: Not on file  Food Insecurity: Not on file  Transportation Needs: Not on file  Physical Activity: Not on file  Stress: Not on file  Social Connections: Not on file  Intimate Partner Violence: Not on file    Past Surgical History:  Procedure Laterality Date   HEMORROIDECTOMY     x2    Family History  Problem Relation Age of Onset   Lung cancer Maternal Grandmother    Colon cancer Neg Hx    Esophageal cancer Neg Hx    Rectal cancer Neg Hx    Stomach cancer Neg Hx     No Known Allergies  Current Outpatient Medications on File Prior to Visit  Medication Sig Dispense Refill   AMBULATORY NON FORMULARY MEDICATION Nitroglycerin ointment 0.125% apply BID rectally for 6-8 weeks 30 g 0   amLODipine (NORVASC) 10 MG tablet TAKE 1 TABLET BY MOUTH EVERY DAY 90 tablet 3   ASPIRIN 81  PO Take 1 tablet by mouth daily.     atorvastatin (LIPITOR) 10 MG tablet TAKE 1 TABLET BY MOUTH EVERY DAY 90 tablet 1   atorvastatin (LIPITOR) 10 MG tablet TAKE 1 TABLET BY MOUTH EVERY DAY 90 tablet 1   busPIRone (BUSPAR) 15 MG tablet TAKE 1 TABLET BY MOUTH 3 TIMES DAILY. 270 tablet 1   Cyanocobalamin (BL VITAMIN B-12 PO) Take 1 tablet by mouth daily.     cyclobenzaprine (FLEXERIL) 5 MG tablet 1 tab po q hs prn tension ha 7 tablet 0   famotidine (PEPCID) 20 MG tablet TAKE 1 TABLET BY MOUTH EVERY DAY 90 tablet 1   fenofibrate (TRICOR) 48 MG tablet TAKE 1 TABLET BY MOUTH EVERY DAY 90 tablet 1   hydrocortisone (ANUSOL-HC) 25 MG suppository Place 1 suppository (25 mg total) rectally 2 (two) times daily. 12 suppository 0   losartan (COZAAR) 25 MG tablet TAKE 1 TABLET (25 MG TOTAL) BY MOUTH DAILY. 90 tablet 2   meloxicam  (MOBIC) 15 MG tablet Take 1 tablet (15 mg total) by mouth daily. 30 tablet 0   Multiple Vitamins-Minerals (MULTIVITAMIN WITH MINERALS) tablet Take 1 tablet by mouth as needed (3 times a week. said he does [place] packet).      sildenafil (VIAGRA) 50 MG tablet 1 tab po as needed one hour prior to sex 10 tablet 0   traZODone (DESYREL) 50 MG tablet TAKE 1/2 TAB AT NIGHT 45 tablet 1   traZODone (DESYREL) 50 MG tablet TAKE 0.5-1 TABLETS BY MOUTH AT BEDTIME AS NEEDED FOR SLEEP. 90 tablet 2   triamcinolone (NASACORT) 55 MCG/ACT AERO nasal inhaler as needed.     venlafaxine XR (EFFEXOR XR) 150 MG 24 hr capsule Take 1 capsule (150 mg total) by mouth daily with breakfast. 30 capsule 11   VITAMIN A PO Take 1 tablet by mouth daily.     VITAMIN D PO Take 1 tablet by mouth daily.     Vitamin D, Ergocalciferol, (DRISDOL) 1.25 MG (50000 UNIT) CAPS capsule TAKE 1 CAPSULE (50,000 UNITS TOTAL) BY MOUTH EVERY 7 (SEVEN) DAYS 4 capsule 1   VITAMIN E PO Take 1 tablet by mouth daily.     No current facility-administered medications on file prior to visit.    BP 139/80   Pulse 60   Temp 98 F (36.7 C)   Resp 18   Ht 6\' 2"  (1.88 m)   Wt 272 lb 6.4 oz (123.6 kg)   SpO2 100%   BMI 34.97 kg/m        Objective:   Physical Exam   General Mental Status- Alert. General Appearance- Not in acute distress.   Skin General: Color- Normal Color. Moisture- Normal Moisture.  Neck Carotid Arteries- Normal color. Moisture- Normal Moisture. No carotid bruits. No JVD.  Chest and Lung Exam Auscultation: Breath Sounds:-Normal.  Cardiovascular Auscultation:Rythm- Regular. Murmurs & Other Heart Sounds:Auscultation of the heart reveals- No Murmurs.  Abdomen Inspection:-Inspeection Normal. Palpation/Percussion:Note:No mass. Palpation and Percussion of the abdomen reveal- Non Tender, Non Distended + BS, no rebound or guarding.   Neurologic Cranial Nerve exam:- CN III-XII intact(No nystagmus), symmetric  smile. Strength:- 5/5 equal and symmetric strength both upper and lower extremities.    Left knee- faint swelling. No crepitus. No instability. Mild tender medial tibial plateau. Good rom. Mild fullness     Assessment & Plan:   Anxiety, mild depression and substance abuse.  continue buspar, effexor and trazadone. Work Diplomatic Services operational officer note as your requested.   Diabetes-expressed low  sugar diet, stop alcohol use and recommend getting back into the gym to exercise. Last a1c was 6.6.   Elevated triglycerides and low HDL.  With diabetes important to eat low-cholesterol diet and continue current atorvastatin along with fenofibrate. Get labs today.   Hypertension-continue losartan  Left knee/popliteal pain. Will get [place] to see if you have baker cyst and go ahead and get knee xray.   Follow up date to be determined after lab and imaging review.  Mackie Pai, PA-C

## 2022-07-09 NOTE — Telephone Encounter (Signed)
[place] called wanting to know if there is a end date for the accommodations letter or if its an indefinite stay at home , and if so they will need more info on the letter.        Fax: [phone]

## 2022-07-10 ENCOUNTER — Ambulatory Visit (HOSPITAL_BASED_OUTPATIENT_CLINIC_OR_DEPARTMENT_OTHER)
Admission: RE | Admit: 2022-07-10 | Discharge: 2022-07-10 | Disposition: A | Discharge status: Home / Self Care | Payer: 59 | Source: Ambulatory Visit | Attending: Medical | Admitting: Medical

## 2022-07-10 DIAGNOSIS — M79609 Pain in unspecified limb: Secondary | ICD-10-CM | POA: Diagnosis present

## 2022-07-12 NOTE — Telephone Encounter (Signed)
Pt called and lvm to return call 

## 2022-07-15 NOTE — Telephone Encounter (Signed)
Spoke with pt , he stated its looking like spring( May 15th)  time but he doesn't have a set date for them, letter updated and faxed.

## 2022-07-20 ENCOUNTER — Other Ambulatory Visit: Payer: Self-pay | Admitting: Medical

## 2022-08-04 ENCOUNTER — Encounter: Payer: Self-pay | Admitting: Family Medicine

## 2022-08-04 ENCOUNTER — Telehealth (INDEPENDENT_AMBULATORY_CARE_PROVIDER_SITE_OTHER): Payer: 59 | Admitting: Family Medicine

## 2022-08-04 DIAGNOSIS — J069 Acute upper respiratory infection, unspecified: Secondary | ICD-10-CM | POA: Diagnosis not present

## 2022-08-04 NOTE — Patient Instructions (Signed)

## 2022-08-04 NOTE — Progress Notes (Signed)
Virtual Video Visit via MyChart Note  I connected with  Walter Nichols on 08/04/22 at  1:00 PM EST by the video enabled telemedicine application for MyChart, and verified that I am speaking with the correct person using two identifiers.   I introduced myself as a Publishing rights manager with the practice. We discussed the limitations of evaluation and management by telemedicine and the availability of in person appointments. The patient expressed understanding and agreed to proceed.  Participating parties in this visit include: The patient and the nurse practitioner listed.  The patient is: At home I am: In the office - [facility]  Subjective:    CC: URI symptoms, COVID exposure   HPI: Walter Nichols is a 51 y.o. year old male presenting today via MyChart today for URI symptoms and COVID exposure.  Patient reports that his father, mother, and son (all in his household) have tested positive for COVID over the past several days. He started having sore throat today and tested negative, but reports his test was expired. Reports otherwise he feels fine and denies respiratory symptoms or history. He is wondering what OTC remedies would be good to have on hand.    Past medical history, Surgical history, Family history not pertinant except as noted below, Social history, Allergies, and medications have been entered into the medical record, reviewed, and corrections made.   Review of Systems:  All review of systems negative except what is listed in the HPI   Objective:    General:  Speaking clearly in complete sentences. Absent shortness of breath noted.   Alert and oriented x3.   Normal judgment.  Absent acute distress.   Impression and Recommendations:    1. Viral URI No severe symptoms at this time.  His questions about Paxlovid were answered and he declined prescription at this time.  Added OTC medications, supportive measures, and symptoms to monitor for  on AVS.  Continue supportive measures including rest, hydration, humidifier use, steam showers, warm compresses to sinuses, warm liquids with lemon and honey, and over-the-counter cough, cold, and analgesics as needed.   Patient aware of signs/symptoms requiring further/urgent evaluation.     Follow-up if symptoms worsen or fail to improve.    I discussed the assessment and treatment plan with the patient. The patient was provided an opportunity to ask questions and all were answered. The patient agreed with the plan and demonstrated an understanding of the instructions.   The patient was advised to call back or seek an in-person evaluation if the symptoms worsen or if the condition fails to improve as anticipated.  I spent 20 minutes dedicated to the care of this patient on the date of this encounter to include pre-visit chart review of prior notes and results, face-to-face time with the patient, and post-visit ordering of testing as indicated.   Clayborne Dana, NP

## 2022-09-28 ENCOUNTER — Ambulatory Visit (INDEPENDENT_AMBULATORY_CARE_PROVIDER_SITE_OTHER): Payer: 59 | Admitting: Medical

## 2022-09-28 VITALS — BP 122/83 | HR 67 | Temp 97.6°F | Resp 16 | Ht 74.0 in | Wt 272.0 lb

## 2022-09-28 DIAGNOSIS — Z113 Encounter for screening for infections with a predominantly sexual mode of transmission: Secondary | ICD-10-CM | POA: Diagnosis not present

## 2022-09-28 DIAGNOSIS — R972 Elevated prostate specific antigen [PSA]: Secondary | ICD-10-CM

## 2022-09-28 DIAGNOSIS — Z125 Encounter for screening for malignant neoplasm of prostate: Secondary | ICD-10-CM | POA: Diagnosis not present

## 2022-09-28 DIAGNOSIS — Z0000 Encounter for general adult medical examination without abnormal findings: Secondary | ICD-10-CM | POA: Diagnosis not present

## 2022-09-28 DIAGNOSIS — Z23 Encounter for immunization: Secondary | ICD-10-CM

## 2022-09-28 LAB — CBC WITH DIFFERENTIAL/PLATELET
Basophils Absolute: 0.1 10*3/uL (ref 0.0–0.1)
Basophils Relative: 0.8 % (ref 0.0–3.0)
Eosinophils Absolute: 0.3 10*3/uL (ref 0.0–0.7)
Eosinophils Relative: 4.2 % (ref 0.0–5.0)
HCT: 41.6 % (ref 39.0–52.0)
Hemoglobin: 13.8 g/dL (ref 13.0–17.0)
Lymphocytes Relative: 35.4 % (ref 12.0–46.0)
Lymphs Abs: 2.4 10*3/uL (ref 0.7–4.0)
MCHC: 33.2 g/dL (ref 30.0–36.0)
MCV: 84.7 fl (ref 78.0–100.0)
Monocytes Absolute: 0.6 10*3/uL (ref 0.1–1.0)
Monocytes Relative: 9 % (ref 3.0–12.0)
Neutro Abs: 3.4 10*3/uL (ref 1.4–7.7)
Neutrophils Relative %: 50.6 % (ref 43.0–77.0)
Platelets: 352 10*3/uL (ref 150.0–400.0)
RBC: 4.91 Mil/uL (ref 4.22–5.81)
RDW: 14.3 % (ref 11.5–15.5)
WBC: 6.7 10*3/uL (ref 4.0–10.5)

## 2022-09-28 LAB — LDL CHOLESTEROL, DIRECT: Direct LDL: 78 mg/dL

## 2022-09-28 LAB — LIPID PANEL
Cholesterol: 147 mg/dL (ref 0–200)
HDL: 35 mg/dL — ABNORMAL LOW (ref >39.00)
NonHDL: 112.33
Total CHOL/HDL Ratio: 4
Triglycerides: 269 mg/dL — ABNORMAL HIGH (ref 0.0–149.0)
VLDL: 53.8 mg/dL — ABNORMAL HIGH (ref 0.0–40.0)

## 2022-09-28 LAB — COMPREHENSIVE METABOLIC PANEL
ALT: 47 U/L (ref 0–53)
AST: 46 U/L — ABNORMAL HIGH (ref 0–37)
Albumin: 4.4 g/dL (ref 3.5–5.2)
Alkaline Phosphatase: 72 U/L (ref 39–117)
BUN: 7 mg/dL (ref 6–23)
CO2: 24 mEq/L (ref 19–32)
Calcium: 9.5 mg/dL (ref 8.4–10.5)
Chloride: 107 mEq/L (ref 96–112)
Creatinine, Ser: 1.12 mg/dL (ref 0.40–1.50)
GFR: 76.77 mL/min (ref >60.00)
Glucose, Bld: 104 mg/dL — ABNORMAL HIGH (ref 70–99)
Potassium: 4.1 mEq/L (ref 3.5–5.1)
Sodium: 139 mEq/L (ref 135–145)
Total Bilirubin: 0.3 mg/dL (ref 0.2–1.2)
Total Protein: 7 g/dL (ref 6.0–8.3)

## 2022-09-28 LAB — PSA: PSA: 7.48 ng/mL — ABNORMAL HIGH (ref 0.10–4.00)

## 2022-09-28 NOTE — Addendum Note (Signed)
Addended by: Jeronimo Greaves on: 09/28/2022 10:05 AM   Modules accepted: Orders

## 2022-09-28 NOTE — Patient Instructions (Addendum)
For you wellness exam today I have ordered cbc, cmp ,psa and lipid panel.  He also wants std screen. Hiv and rpr.  Flu vaccine.   Recommend smoking cessation  Recommend exercise and healthy diet.  We will let you know lab results as they come in.  Follow up date appointment will be determined after lab review.     Preventive Care 57-51 Years Old, Male Preventive care refers to lifestyle choices and visits with your health care provider that can promote health and wellness. Preventive care visits are also called wellness exams. What can I expect for my preventive care visit? Counseling During your preventive care visit, your health care provider may ask about your: Medical history, including: Past medical problems. Family medical history. Current health, including: Emotional well-being. Home life and relationship well-being. Sexual activity. Lifestyle, including: Alcohol, nicotine or tobacco, and drug use. Access to firearms. Diet, exercise, and sleep habits. Safety issues such as seatbelt and bike helmet use. Sunscreen use. Work and work Statistician. Physical exam Your health care provider will check your: Height and weight. These may be used to calculate your BMI (body mass index). BMI is a measurement that tells if you are at a healthy weight. Waist circumference. This measures the distance around your waistline. This measurement also tells if you are at a healthy weight and may help predict your risk of certain diseases, such as type 2 diabetes and high blood pressure. Heart rate and blood pressure. Body temperature. Skin for abnormal spots. What immunizations do I need?  Vaccines are usually given at various ages, according to a schedule. Your health care provider will recommend vaccines for you based on your age, medical history, and lifestyle or other factors, such as travel or where you work. What tests do I need? Screening Your health care provider may recommend  screening tests for certain conditions. This may include: Lipid and cholesterol levels. Diabetes screening. This is done by checking your blood sugar (glucose) after you have not eaten for a while (fasting). Hepatitis B test. Hepatitis C test. HIV (human immunodeficiency virus) test. STI (sexually transmitted infection) testing, if you are at risk. Lung cancer screening. Prostate cancer screening. Colorectal cancer screening. Talk with your health care provider about your test results, treatment options, and if necessary, the need for more tests. Follow these instructions at home: Eating and drinking  Eat a diet that includes fresh fruits and vegetables, whole grains, lean protein, and low-fat dairy products. Take vitamin and mineral supplements as recommended by your health care provider. Do not drink alcohol if your health care provider tells you not to drink. If you drink alcohol: Limit how much you have to 0-2 drinks a day. Know how much alcohol is in your drink. In the [place] one drink equals one 12 oz bottle of beer (355 mL), one 5 oz glass of wine (148 mL), or one 1 oz glass of hard liquor (44 mL). Lifestyle Brush your teeth every morning and night with fluoride toothpaste. Floss one time each day. Exercise for at least 30 minutes 5 or more days each week. Do not use any products that contain nicotine or tobacco. These products include cigarettes, chewing tobacco, and vaping devices, such as e-cigarettes. If you need help quitting, ask your health care provider. Do not use drugs. If you are sexually active, practice safe sex. Use a condom or other form of protection to prevent STIs. Take aspirin only as told by your health care provider. Make sure that you  understand how much to take and what form to take. Work with your health care provider to find out whether it is safe and beneficial for you to take aspirin daily. Find healthy ways to manage stress, such as: Meditation, yoga, or  listening to music. Journaling. Talking to a trusted person. Spending time with friends and family. Minimize exposure to UV radiation to reduce your risk of skin cancer. Safety Always wear your seat belt while driving or riding in a vehicle. Do not drive: If you have been drinking alcohol. Do not ride with someone who has been drinking. When you are tired or distracted. While texting. If you have been using any mind-altering substances or drugs. Wear a helmet and other protective equipment during sports activities. If you have firearms in your house, make sure you follow all gun safety procedures. What's next? Go to your health care provider once a year for an annual wellness visit. Ask your health care provider how often you should have your eyes and teeth checked. Stay up to date on all vaccines. This information is not intended to replace advice given to you by your health care provider. Make sure you discuss any questions you have with your health care provider. Document Revised: 02/25/2021 Document Reviewed: 02/25/2021 Elsevier Patient Education  [place].

## 2022-09-28 NOTE — Progress Notes (Signed)
Subjective:    Patient ID: Walter Nichols, male    DOB: Jan 22, 1971, 52 y.o.   MRN: [id number]  HPI  Pt in for cpe/wellness exam   Working at [employer]. Not exercising much, Diet is moderate healthy. No drug use. He does smoking about pack every 2-3 days. Alcohol use 1-2 beer on weekends. States no recent cocaine or marijuana.  Will get flu vaccine today. He agrees to flu vaccine. He will delay shingrix vaccine.  He wants std screen labs. No known exposure or std symptoms.   Up to date on colonoscopy.  Review of Systems  Constitutional:  Negative for chills, fatigue and fever.  HENT:  Negative for congestion, ear pain and hearing loss.   Respiratory:  Negative for cough, chest tightness, shortness of breath and wheezing.   Cardiovascular:  Negative for chest pain and palpitations.  Gastrointestinal:  Negative for abdominal distention, abdominal pain, blood in stool, diarrhea and nausea.  Genitourinary:  Negative for dysuria, flank pain, hematuria and testicular pain.  Musculoskeletal:  Negative for back pain, joint swelling and neck pain.  Skin:  Negative for rash.  Neurological:  Negative for dizziness, seizures, weakness and light-headedness.  Hematological:  Negative for adenopathy. Does not bruise/bleed easily.  Psychiatric/Behavioral:  Negative for behavioral problems, decreased concentration, dysphoric mood and hallucinations.     Past Medical History:  Diagnosis Date   Alcoholism ([place])    Anal fissure    Anxiety    Depression    Hypertension    Migraine      Social History   Socioeconomic History   Marital status: Single    Spouse name: Not on file   Number of children: Not on file   Years of education: Not on file   Highest education level: Not on file  Occupational History   Not on file  Tobacco Use   Smoking status: Every Day    Packs/day: 0.50    Types: Cigarettes   Smokeless tobacco: Never  Vaping Use   Vaping Use: Never used  Substance and  Sexual Activity   Alcohol use: Not Currently    Comment: 40 oz a day/ocassionally    Drug use: No    Comment: in the past used coccaine. 3 yrs ago.   Sexual activity: Never  Other Topics Concern   Not on file  Social History Narrative   Not on file   Social Determinants of Health   Financial Resource Strain: Not on file  Food Insecurity: Not on file  Transportation Needs: Not on file  Physical Activity: Not on file  Stress: Not on file  Social Connections: Not on file  Intimate Partner Violence: Not on file    Past Surgical History:  Procedure Laterality Date   HEMORROIDECTOMY     x2    Family History  Problem Relation Age of Onset   Lung cancer Maternal Grandmother    Colon cancer Neg Hx    Esophageal cancer Neg Hx    Rectal cancer Neg Hx    Stomach cancer Neg Hx     No Known Allergies  Current Outpatient Medications on File Prior to Visit  Medication Sig Dispense Refill   AMBULATORY NON FORMULARY MEDICATION Nitroglycerin ointment 0.125% apply BID rectally for 6-8 weeks 30 g 0   amLODipine (NORVASC) 10 MG tablet Take 1 tablet (10 mg total) by mouth daily. 90 tablet 0   ASPIRIN 81 PO Take 1 tablet by mouth daily.     atorvastatin (LIPITOR)  10 MG tablet TAKE 1 TABLET BY MOUTH EVERY DAY 90 tablet 1   atorvastatin (LIPITOR) 10 MG tablet TAKE 1 TABLET BY MOUTH EVERY DAY 90 tablet 1   busPIRone (BUSPAR) 15 MG tablet TAKE 1 TABLET BY MOUTH 3 TIMES DAILY. 270 tablet 1   Cyanocobalamin (BL VITAMIN B-12 PO) Take 1 tablet by mouth daily.     cyclobenzaprine (FLEXERIL) 5 MG tablet 1 tab po q hs prn tension ha 7 tablet 0   famotidine (PEPCID) 20 MG tablet TAKE 1 TABLET BY MOUTH EVERY DAY 90 tablet 1   fenofibrate (TRICOR) 48 MG tablet Take 1 tablet (48 mg total) by mouth daily. 90 tablet 0   hydrocortisone (ANUSOL-HC) 25 MG suppository Place 1 suppository (25 mg total) rectally 2 (two) times daily. 12 suppository 0   losartan (COZAAR) 25 MG tablet TAKE 1 TABLET (25 MG TOTAL)  BY MOUTH DAILY. 90 tablet 2   meloxicam (MOBIC) 15 MG tablet Take 1 tablet (15 mg total) by mouth daily. 30 tablet 0   Multiple Vitamins-Minerals (MULTIVITAMIN WITH MINERALS) tablet Take 1 tablet by mouth as needed (3 times a week. 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      sildenafil (VIAGRA) 50 MG tablet 1 tab po as needed one hour prior to sex 10 tablet 0   traZODone (DESYREL) 50 MG tablet TAKE 1/2 TAB AT NIGHT 45 tablet 1   traZODone (DESYREL) 50 MG tablet TAKE 0.5-1 TABLETS BY MOUTH AT BEDTIME AS NEEDED FOR SLEEP. 90 tablet 2   triamcinolone (NASACORT) 55 MCG/ACT AERO nasal inhaler as needed.     venlafaxine XR (EFFEXOR XR) 150 MG 24 hr capsule Take 1 capsule (150 mg total) by mouth daily with breakfast. 30 capsule 11   VITAMIN A PO Take 1 tablet by mouth daily.     VITAMIN D PO Take 1 tablet by mouth daily.     Vitamin D, Ergocalciferol, (DRISDOL) 1.25 MG (50000 UNIT) CAPS capsule TAKE 1 CAPSULE (50,000 UNITS TOTAL) BY MOUTH EVERY 7 (SEVEN) DAYS 4 capsule 1   VITAMIN E PO Take 1 tablet by mouth daily.     No current facility-administered medications on file prior to visit.    BP 122/83 (BP Location: Right Arm, Patient Position: Sitting, Cuff Size: Normal)   Pulse 67   Temp 97.6 F (36.4 C) (Oral)   Resp 16   Ht 6\' 2"  (1.88 m)   Wt 272 lb (123.4 kg)   SpO2 100%   BMI 34.92 kg/m        Objective:   Physical Exam  General Mental Status- Alert. General Appearance- Not in acute distress.   Skin General: Color- Normal Color. Moisture- Normal Moisture.  Neck Carotid Arteries- Normal color. Moisture- Normal Moisture. No carotid bruits. No JVD.  Chest and Lung Exam Auscultation: Breath Sounds:-Normal.  Cardiovascular Auscultation:Rythm- Regular. Murmurs & Other Heart Sounds:Auscultation of the heart reveals- No Murmurs.  Abdomen Inspection:-Inspeection Normal. Palpation/Percussion:Note:No mass. Palpation and Percussion of the abdomen reveal- Non Tender, Non Distended + BS, no  rebound or guarding.  Neurologic Cranial Nerve exam:- CN III-XII intact(No nystagmus), symmetric smile. Strength:- 5/5 equal and symmetric strength both upper and lower extremities.       Assessment & Plan:   Patient Instructions  For you wellness exam today I have ordered cbc, cmp and lipid panel.  He also wants std screen. Hiv and rpr.  Flu vaccine.   Recommend exercise and healthy diet.  We will let you know lab results as  they come in.  Follow up date appointment will be determined after lab review.        Mackie Pai, PA-C

## 2022-09-29 LAB — HIV ANTIBODY (ROUTINE TESTING W REFLEX): HIV 1&2 Ab, 4th Generation: NONREACTIVE

## 2022-09-29 LAB — RPR: RPR Ser Ql: NONREACTIVE

## 2022-09-30 MED ORDER — CIPROFLOXACIN HCL 500 MG PO TABS: 500.0000 mg | ORAL_TABLET | Freq: Two times a day (BID) | ORAL | 0 refills | Status: DC

## 2022-09-30 NOTE — Addendum Note (Signed)
Addended by: Anabel Halon on: 09/30/2022 05:35 PM   Modules accepted: Orders

## 2022-10-12 ENCOUNTER — Other Ambulatory Visit: Payer: Self-pay | Admitting: Medical

## 2022-10-16 ENCOUNTER — Other Ambulatory Visit: Payer: Self-pay | Admitting: Medical

## 2022-10-20 ENCOUNTER — Other Ambulatory Visit: Payer: Self-pay

## 2022-10-20 DIAGNOSIS — R972 Elevated prostate specific antigen [PSA]: Secondary | ICD-10-CM

## 2022-10-27 ENCOUNTER — Other Ambulatory Visit (INDEPENDENT_AMBULATORY_CARE_PROVIDER_SITE_OTHER): Payer: 59

## 2022-10-27 DIAGNOSIS — R972 Elevated prostate specific antigen [PSA]: Secondary | ICD-10-CM

## 2022-10-27 DIAGNOSIS — E559 Vitamin D deficiency, unspecified: Secondary | ICD-10-CM

## 2022-10-27 DIAGNOSIS — R1030 Lower abdominal pain, unspecified: Secondary | ICD-10-CM

## 2022-10-27 LAB — PSA: PSA: 9.03 ng/mL — ABNORMAL HIGH (ref 0.10–4.00)

## 2022-10-28 NOTE — Addendum Note (Signed)
Addended by: Anabel Halon on: 10/28/2022 03:55 PM   Modules accepted: Orders

## 2022-11-09 ENCOUNTER — Telehealth: Payer: Self-pay | Admitting: Medical

## 2022-11-09 NOTE — Telephone Encounter (Signed)
Forms picked up

## 2022-11-09 NOTE — Telephone Encounter (Signed)
[employer] forms faxed into front office  Placed in [place] bin up front

## 2022-11-09 NOTE — Telephone Encounter (Signed)
Pt just asked [place] to call or text him once forms are processed.

## 2022-11-12 ENCOUNTER — Telehealth (INDEPENDENT_AMBULATORY_CARE_PROVIDER_SITE_OTHER): Payer: 59 | Admitting: Medical

## 2022-11-12 DIAGNOSIS — F329 Major depressive disorder, single episode, unspecified: Secondary | ICD-10-CM | POA: Diagnosis not present

## 2022-11-12 DIAGNOSIS — F419 Anxiety disorder, unspecified: Secondary | ICD-10-CM | POA: Diagnosis not present

## 2022-11-12 NOTE — Progress Notes (Signed)
   Subjective:    Patient ID: Walter Nichols, male    DOB: 08-May-1971, 52 y.o.   MRN: [id number]  HPI  Virtual Visit via Video Note  I connected with Walter Nichols on 11/12/22 at  3:00 PM EST by a video enabled telemedicine application and verified that I am speaking with the correct person using two identifiers.  Location: Patient: home Provider: office   I discussed the limitations of evaluation and management by telemedicine and the availability of in person appointments. The patient expressed understanding and agreed to proceed.  History of Present Illness: Patient has history of anxiety, depression and substance abuse.  Recent increase anxiety primarily after receiving legal documents regarding possible several liability regarding sons behavior.  Patient admits to drinking alcohol about 5 beers a day.  Describes having insomnia, decreased appetite, disheveled appearance, crying easily and not able to concentrate.  We went over quite extensive for him assessing his mental status.   Observations/Objective: General-no acute distress, pleasant, oriented. Lungs- on inspection lungs appear unlabored. Neck- no tracheal deviation or jvd on inspection. Neuro- gross motor function appears intact.   Assessment and Plan: Increased anxiety, reactive depression and substance abuse.  No thoughts of harm to self or others.  Not able to work in light of recent events.  Filled out extensive multi page form with patient today on video visit.  Patient will continue Effexor 150 mg daily and trazodone 50 mg at night.   Asking to follow-up end of March prior to his scheduled return to work date.  Sooner if needed.  Time spent with patient today was 45  minutes which consisted of chart review, discussing diagnosis, work up, treatment , and out [place] [facility] assessment form and getting patient's input.  Any needs and documentation time.  Follow Up Instructions:    I discussed the assessment and  treatment plan with the patient. The patient was provided an opportunity to ask questions and all were answered. The patient agreed with the plan and demonstrated an understanding of the instructions.   The patient was advised to call back or seek an in-person evaluation if the symptoms worsen or if the condition fails to improve as anticipated.     Mackie Pai, PA-C    Review of Systems  Constitutional:  Negative for chills.  Respiratory:  Positive for cough and wheezing. Negative for chest tightness.   Cardiovascular:  Positive for chest pain and palpitations.  Gastrointestinal:  Positive for abdominal pain.  Musculoskeletal:  Negative for back pain.  Neurological:  Negative for dizziness, syncope and numbness.  Hematological:  Negative for adenopathy. Does not bruise/bleed easily.  Psychiatric/Behavioral:  Positive for behavioral problems, dysphoric mood and sleep disturbance. Negative for agitation and suicidal ideas. The patient is nervous/anxious.        Objective:   Physical Exam        Assessment & Plan:

## 2022-11-12 NOTE — Telephone Encounter (Signed)
Pt wanted to give pcp [employer] fax [phone].

## 2022-11-12 NOTE — Patient Instructions (Signed)
Increased anxiety, reactive depression and substance abuse.  No thoughts of harm to self or others.  Not able to work in light of recent events.  Filled out extensive multi page form with patient today on video visit.  Patient will continue Effexor 150 mg daily and trazodone 50 mg at night.   Asking to follow-up end of March prior to his scheduled return to work date.  Sooner if needed.

## 2022-11-12 NOTE — Telephone Encounter (Signed)
Will fax forms once pcp has signed off on chart due to forms needing office notes

## 2022-11-16 NOTE — Telephone Encounter (Signed)
Forms faxed

## 2022-11-18 ENCOUNTER — Other Ambulatory Visit: Payer: Self-pay | Admitting: Urology

## 2022-11-18 DIAGNOSIS — R972 Elevated prostate specific antigen [PSA]: Secondary | ICD-10-CM

## 2022-12-17 ENCOUNTER — Ambulatory Visit (INDEPENDENT_AMBULATORY_CARE_PROVIDER_SITE_OTHER): Payer: 59 | Admitting: Medical

## 2022-12-17 ENCOUNTER — Telehealth: Payer: 59 | Admitting: Medical

## 2022-12-17 ENCOUNTER — Encounter: Payer: Self-pay | Admitting: Medical

## 2022-12-17 VITALS — BP 130/70 | HR 80 | Resp 18 | Ht 74.0 in | Wt 282.0 lb

## 2022-12-17 DIAGNOSIS — K649 Unspecified hemorrhoids: Secondary | ICD-10-CM | POA: Diagnosis not present

## 2022-12-17 DIAGNOSIS — Z8719 Personal history of other diseases of the digestive system: Secondary | ICD-10-CM | POA: Diagnosis not present

## 2022-12-17 DIAGNOSIS — R1032 Left lower quadrant pain: Secondary | ICD-10-CM

## 2022-12-17 MED ORDER — METRONIDAZOLE 500 MG PO TABS: 500.0000 mg | ORAL_TABLET | Freq: Three times a day (TID) | ORAL | 0 refills | Status: AC

## 2022-12-17 MED ORDER — CIPROFLOXACIN HCL 500 MG PO TABS: 500.0000 mg | ORAL_TABLET | Freq: Two times a day (BID) | ORAL | 0 refills | Status: DC

## 2022-12-17 MED ORDER — HYDROCORTISONE ACETATE 25 MG RE SUPP: 25.0000 mg | Freq: Two times a day (BID) | RECTAL | 0 refills | Status: DC

## 2022-12-17 NOTE — Progress Notes (Signed)
Subjective:    Patient ID: Walter Nichols, male    DOB: 11/15/1970, 52 y.o.   MRN: [id number]  HPI  Pt in for evaluation.   Pt states on Saturday states 1/2 bottle mag citrate with painful hard bowel movement  and on Sunday also 1/2 bottle followed by painful hard bowel movement.   Pt explains he took magnesium since he had no bowel movement for preceding 2 days.   Pt state he had a lot of blood in toilet water and some on stool. After bm he is not having any blood on underwear. No bleeding.    Pt has increase psa value. Has seen urologist and mri is being done on Monday. Eventually will get prostate biopsy.  On prior colonoscopy in 2021 hx of hemorrhoid and fissure.      Pt had colonoscopy.  2011 and 2016 he had 2 hemorrhoid surgeries/procedures.  No itching to rectum but does have pain when having bm.   Since Monday Tuesday and wed still having some pain. Thursday seemed have eaed.    Review of Systems  Constitutional:  Negative for chills, fatigue and fever.  Respiratory:  Negative for cough, chest tightness and wheezing.   Cardiovascular:  Negative for chest pain and palpitations.  Gastrointestinal:  Negative for abdominal pain, blood in stool, diarrhea and nausea.       See hpi.  Note some llq pain mild on exam but on 2021 colonocopy divertucula seen.  Musculoskeletal:  Negative for back pain, myalgias and neck stiffness.  Skin:  Negative for pallor and rash.  Neurological:  Negative for dizziness, seizures and syncope.  Hematological:  Negative for adenopathy. Does not bruise/bleed easily.  Psychiatric/Behavioral:  Negative for behavioral problems and confusion.     Past Medical History:  Diagnosis Date   Alcoholism (HCC)    Anal fissure    Anxiety    Depression    Hypertension    Migraine      Social History   Socioeconomic History   Marital status: Single    Spouse name: Not on file   Number of children: Not on file   Years of education: Not on  file   Highest education level: Not on file  Occupational History   Not on file  Tobacco Use   Smoking status: Every Day    Packs/day: .5    Types: Cigarettes   Smokeless tobacco: Never  Vaping Use   Vaping Use: Never used  Substance and Sexual Activity   Alcohol use: Not Currently    Comment: 40 oz a day/ocassionally    Drug use: No    Comment: in the past used coccaine. 3 yrs ago.   Sexual activity: Never  Other Topics Concern   Not on file  Social History Narrative   Not on file   Social Determinants of Health   Financial Resource Strain: Not on file  Food Insecurity: Not on file  Transportation Needs: Not on file  Physical Activity: Not on file  Stress: Not on file  Social Connections: Not on file  Intimate Partner Violence: Not on file    Past Surgical History:  Procedure Laterality Date   HEMORROIDECTOMY     x2    Family History  Problem Relation Age of Onset   Lung cancer Maternal Grandmother    Colon cancer Neg Hx    Esophageal cancer Neg Hx    Rectal cancer Neg Hx    Stomach cancer Neg Hx  No Known Allergies  Current Outpatient Medications on File Prior to Visit  Medication Sig Dispense Refill   AMBULATORY NON FORMULARY MEDICATION Nitroglycerin ointment 0.125% apply BID rectally for 6-8 weeks 30 g 0   amLODipine (NORVASC) 10 MG tablet TAKE 1 TABLET BY MOUTH EVERY DAY 90 tablet 0   ASPIRIN 81 PO Take 1 tablet by mouth daily.     atorvastatin (LIPITOR) 10 MG tablet TAKE 1 TABLET BY MOUTH EVERY DAY 90 tablet 1   atorvastatin (LIPITOR) 10 MG tablet TAKE 1 TABLET BY MOUTH EVERY DAY 90 tablet 1   busPIRone (BUSPAR) 15 MG tablet TAKE 1 TABLET BY MOUTH 3 TIMES DAILY. 270 tablet 1   Cyanocobalamin (BL VITAMIN B-12 PO) Take 1 tablet by mouth daily.     cyclobenzaprine (FLEXERIL) 5 MG tablet 1 tab po q hs prn tension ha 7 tablet 0   famotidine (PEPCID) 20 MG tablet TAKE 1 TABLET BY MOUTH EVERY DAY 90 tablet 1   fenofibrate (TRICOR) 48 MG tablet TAKE 1  TABLET BY MOUTH EVERY DAY 90 tablet 0   hydrocortisone (ANUSOL-HC) 25 MG suppository Place 1 suppository (25 mg total) rectally 2 (two) times daily. 12 suppository 0   losartan (COZAAR) 25 MG tablet TAKE 1 TABLET (25 MG TOTAL) BY MOUTH DAILY. 90 tablet 2   meloxicam (MOBIC) 15 MG tablet Take 1 tablet (15 mg total) by mouth daily. 30 tablet 0   Multiple Vitamins-Minerals (MULTIVITAMIN WITH MINERALS) tablet Take 1 tablet by mouth as needed (3 times a week. 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      sildenafil (VIAGRA) 50 MG tablet 1 tab po as needed one hour prior to sex 10 tablet 0   traZODone (DESYREL) 50 MG tablet TAKE 1/2 TAB AT NIGHT 45 tablet 1   traZODone (DESYREL) 50 MG tablet TAKE 0.5-1 TABLETS BY MOUTH AT BEDTIME AS NEEDED FOR SLEEP. 90 tablet 2   triamcinolone (NASACORT) 55 MCG/ACT AERO nasal inhaler as needed.     venlafaxine XR (EFFEXOR XR) 150 MG 24 hr capsule Take 1 capsule (150 mg total) by mouth daily with breakfast. 30 capsule 11   VITAMIN A PO Take 1 tablet by mouth daily.     VITAMIN D PO Take 1 tablet by mouth daily.     Vitamin D, Ergocalciferol, (DRISDOL) 1.25 MG (50000 UNIT) CAPS capsule TAKE 1 CAPSULE (50,000 UNITS TOTAL) BY MOUTH EVERY 7 (SEVEN) DAYS 4 capsule 1   VITAMIN E PO Take 1 tablet by mouth daily.     No current facility-administered medications on file prior to visit.    BP (!) 146/80   Pulse 80   Resp 18   Ht 6\' 2"  (1.88 m)   Wt 282 lb (127.9 kg)   SpO2 100%   BMI 36.21 kg/m        Objective:   Physical Exam  General Mental Status- Alert. General Appearance- Not in acute distress.   Skin General: Color- Normal Color. Moisture- Normal Moisture.  Neck Carotid Arteries- Normal color. Moisture- Normal Moisture. No carotid bruits. No JVD.  Chest and Lung Exam Auscultation: Breath Sounds:-Normal.  Cardiovascular Auscultation:Rythm- Regular. Murmurs & Other Heart Sounds:Auscultation of the heart reveals- No Murmurs.  Abdomen Inspection:-Inspeection  Normal. Palpation/Percussion:Note:No mass. Palpation and Percussion of the abdomen reveal- Non Tender, Non Distended + BS, no rebound or guarding.   Neurologic Cranial Nerve exam:- CN III-XII intact(No nystagmus), symmetric smile. Strength:- 5/5 equal and symmetric strength both upper and lower extremities.   Back- no  cva tenderness.   Rectum- laying on left side hemorrhoid at 3 oclock position small hemorrhoid that does not look thombosed. No fissure seen.       Assessment & Plan:   Patient Instructions  1. Hemorrhoids, unspecified hemorrhoid type On exam small hemorrhoid. Does no look thrombosed but is tender. Rx annusol hc suppository. Sitz bath and stay hydrate. Avoid constipation. Your bleeding in normal variant range but if constant past your bm then be seen in ED. No fissure seen but may have. If sympoms persist will refer back to gi.   2. Left lower quadrant abdominal pain Faint pain llq on exam but you did not report until exam. Known divertucula in past on 2021 colonscopy. It any constant pain go ahead and start cipro and flagyl antibiotic.  3. History of anal fissures None seen today. On exam.  Update me on how you are doing on Monday or Tuesday. Decide on follow up based on update or if need to refer back to GI MD     Esperanza Richters, PA-C

## 2022-12-17 NOTE — Patient Instructions (Addendum)
1. Hemorrhoids, unspecified hemorrhoid type On exam small hemorrhoid. Does no look thrombosed but is tender. Rx annusol hc suppository. Sitz bath and stay hydrate. Avoid constipation. Your bleeding in normal variant range but if constant past your bm then be seen in ED. No fissure seen but may have. If symptoms persist will refer back to gi.   2. Left lower quadrant abdominal pain Faint pain llq on exam but you did not report until exam. Known divertucula in past on 2021 colonscopy. It any constant pain go ahead and start cipro and flagyl antibiotic.  3. History of anal fissures None seen today. On exam.  Update me on how you are doing on Monday or Tuesday. Decide on follow up based on update or if need to refer back to GI MD

## 2022-12-20 ENCOUNTER — Ambulatory Visit
Admission: RE | Admit: 2022-12-20 | Discharge: 2022-12-20 | Disposition: A | Discharge status: Home / Self Care | Payer: 59 | Source: Ambulatory Visit | Attending: Urology | Admitting: Urology

## 2022-12-20 DIAGNOSIS — R972 Elevated prostate specific antigen [PSA]: Secondary | ICD-10-CM

## 2022-12-20 MED ORDER — GADOPICLENOL 0.5 MMOL/ML IV SOLN
10.0000 mL | Freq: Once | INTRAVENOUS | Status: AC | PRN
  Administered 2022-12-20: 10 mL via INTRAVENOUS

## 2022-12-28 ENCOUNTER — Encounter: Payer: Self-pay | Admitting: *Deleted

## 2022-12-31 ENCOUNTER — Other Ambulatory Visit: Payer: Self-pay | Admitting: Medical

## 2023-01-14 ENCOUNTER — Other Ambulatory Visit: Payer: Self-pay | Admitting: Medical

## 2023-02-08 ENCOUNTER — Encounter: Payer: Self-pay | Admitting: Medical

## 2023-02-08 ENCOUNTER — Ambulatory Visit (INDEPENDENT_AMBULATORY_CARE_PROVIDER_SITE_OTHER): Payer: 59 | Admitting: Medical

## 2023-02-08 VITALS — BP 137/80 | HR 80 | Resp 20 | Ht 74.0 in | Wt 288.0 lb

## 2023-02-08 DIAGNOSIS — C61 Malignant neoplasm of prostate: Secondary | ICD-10-CM

## 2023-02-08 DIAGNOSIS — F32A Depression, unspecified: Secondary | ICD-10-CM

## 2023-02-08 DIAGNOSIS — F17200 Nicotine dependence, unspecified, uncomplicated: Secondary | ICD-10-CM

## 2023-02-08 MED ORDER — NICOTINE 21 MG/24HR TD PT24: 21.0000 mg | MEDICATED_PATCH | Freq: Every day | TRANSDERMAL | 0 refills | Status: AC

## 2023-02-08 NOTE — Progress Notes (Signed)
Subjective:    Patient ID: Walter Nichols, male    DOB: [birth year], 52 y.o.   MRN: [id number]  HPI Pt in for follow up.  Pt had psa elevated over past 4 months. Pt states after biopsy he was having he was bleeding some from rectum and having some hemturia.   Pt got prosate biopsy that was + for adenocarcinoma on Thursday.   Pt wants to quit smoking.   Some depressed since diagnosis of prostate cancer.    Pt told urologist he wanted to have entire prostate removed.    Pt PET scan is pending. Pt states urologist is Dr. Modena Slater.     Review of Systems  Constitutional:  Negative for chills, diaphoresis and fatigue.  Respiratory:  Negative for cough, chest tightness, shortness of breath and wheezing.   Cardiovascular:  Negative for chest pain and palpitations.  Genitourinary:  Negative for decreased urine volume, dysuria, flank pain, frequency, penile pain and testicular pain.       Prostate cancer.  Musculoskeletal:  Negative for back pain.  Skin:  Negative for rash.  Neurological:  Negative for dizziness, seizures, syncope, weakness, numbness and headaches.  Hematological:  Negative for adenopathy. Does not bruise/bleed easily.  Psychiatric/Behavioral:  Positive for dysphoric mood. Negative for behavioral problems. The patient is nervous/anxious. The patient is not hyperactive.     Past Medical History:  Diagnosis Date   Alcoholism (HCC)    Anal fissure    Anxiety    Depression    Hypertension    Migraine      Social History   Socioeconomic History   Marital status: Single    Spouse name: Not on file   Number of children: Not on file   Years of education: Not on file   Highest education level: Not on file  Occupational History   Not on file  Tobacco Use   Smoking status: Every Day    Packs/day: .5    Types: Cigarettes   Smokeless tobacco: Never  Vaping Use   Vaping Use: Never used  Substance and Sexual Activity   Alcohol use: Not Currently    Comment:  40 oz a day/ocassionally    Drug use: No    Comment: in the past used coccaine. 3 yrs ago.   Sexual activity: Never  Other Topics Concern   Not on file  Social History Narrative   Not on file   Social Determinants of Health   Financial Resource Strain: Not on file  Food Insecurity: Not on file  Transportation Needs: Not on file  Physical Activity: Not on file  Stress: Not on file  Social Connections: Not on file  Intimate Partner Violence: Not on file    Past Surgical History:  Procedure Laterality Date   HEMORROIDECTOMY     x2    Family History  Problem Relation Age of Onset   Lung cancer Maternal Grandmother    Colon cancer Neg Hx    Esophageal cancer Neg Hx    Rectal cancer Neg Hx    Stomach cancer Neg Hx     No Known Allergies  Current Outpatient Medications on File Prior to Visit  Medication Sig Dispense Refill   AMBULATORY NON FORMULARY MEDICATION Nitroglycerin ointment 0.125% apply BID rectally for 6-8 weeks 30 g 0   amLODipine (NORVASC) 10 MG tablet TAKE 1 TABLET BY MOUTH EVERY DAY 90 tablet 0   ASPIRIN 81 PO Take 1 tablet by mouth daily.     atorvastatin (  LIPITOR) 10 MG tablet TAKE 1 TABLET BY MOUTH EVERY DAY 90 tablet 1   atorvastatin (LIPITOR) 10 MG tablet TAKE 1 TABLET BY MOUTH EVERY DAY 90 tablet 1   busPIRone (BUSPAR) 15 MG tablet TAKE 1 TABLET BY MOUTH 3 TIMES DAILY. 270 tablet 1   ciprofloxacin (CIPRO) 500 MG tablet Take 1 tablet (500 mg total) by mouth 2 (two) times daily. 14 tablet 0   Cyanocobalamin (BL VITAMIN B-12 PO) Take 1 tablet by mouth daily.     cyclobenzaprine (FLEXERIL) 5 MG tablet 1 tab po q hs prn tension ha 7 tablet 0   famotidine (PEPCID) 20 MG tablet TAKE 1 TABLET BY MOUTH EVERY DAY 90 tablet 1   fenofibrate (TRICOR) 48 MG tablet TAKE 1 TABLET BY MOUTH EVERY DAY 90 tablet 0   hydrocortisone (ANUSOL-HC) 25 MG suppository Place 1 suppository (25 mg total) rectally 2 (two) times daily. 12 suppository 0   hydrocortisone (ANUSOL-HC) 25  MG suppository Place 1 suppository (25 mg total) rectally 2 (two) times daily. 12 suppository 0   losartan (COZAAR) 25 MG tablet TAKE 1 TABLET (25 MG TOTAL) BY MOUTH DAILY. 90 tablet 2   meloxicam (MOBIC) 15 MG tablet Take 1 tablet (15 mg total) by mouth daily. 30 tablet 0   Multiple Vitamins-Minerals (MULTIVITAMIN WITH MINERALS) tablet Take 1 tablet by mouth as needed (3 times a week. 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      sildenafil (VIAGRA) 50 MG tablet 1 tab po as needed one hour prior to sex 10 tablet 0   traZODone (DESYREL) 50 MG tablet TAKE 1/2 TAB AT NIGHT 45 tablet 1   traZODone (DESYREL) 50 MG tablet TAKE 0.5-1 TABLETS BY MOUTH AT BEDTIME AS NEEDED FOR SLEEP. 90 tablet 2   triamcinolone (NASACORT) 55 MCG/ACT AERO nasal inhaler as needed.     venlafaxine XR (EFFEXOR XR) 150 MG 24 hr capsule Take 1 capsule (150 mg total) by mouth daily with breakfast. 30 capsule 11   VITAMIN A PO Take 1 tablet by mouth daily.     VITAMIN D PO Take 1 tablet by mouth daily.     Vitamin D, Ergocalciferol, (DRISDOL) 1.25 MG (50000 UNIT) CAPS capsule TAKE 1 CAPSULE (50,000 UNITS TOTAL) BY MOUTH EVERY 7 (SEVEN) DAYS 4 capsule 1   VITAMIN E PO Take 1 tablet by mouth daily.     No current facility-administered medications on file prior to visit.    BP 137/80   Pulse 80   Resp 20   Ht 6\' 2"  (1.88 m)   Wt 288 lb (130.6 kg)   SpO2 92%   BMI 36.98 kg/m          Objective:   Physical Exam  General Mental Status- Alert. General Appearance- Not in acute distress.   Skin General: Color- Normal Color. Moisture- Normal Moisture.  Neck Carotid Arteries- Normal color. Moisture- Normal Moisture. No carotid bruits. No JVD.  Chest and Lung Exam Auscultation: Breath Sounds:-Normal.  Cardiovascular Auscultation:Rythm- Regular. Murmurs & Other Heart Sounds:Auscultation of the heart reveals- No Murmurs.  Abdomen Inspection:-Inspeection Normal. Palpation/Percussion:Note:No mass. Palpation and Percussion of the  abdomen reveal- Non Tender, Non Distended + BS, no rebound or guarding.   Neurologic Cranial Nerve exam:- CN III-XII intact(No nystagmus), symmetric smile. Strength:- 5/5 equal and symmetric strength both upper and lower extremities.       Assessment & Plan:   Patient Instructions  Prostates cancer- Pet scan pending. Urologist told you he would order. Recommend that you  call his office on Thursday for update. If not ordered by thursday yet let me know.  For smoking cessation recommend use nicotine patches. Decided against rx welllbutrin.  For depression and anxiety recommend continue effexor and buspar. If you have thought of harm to self or plans recommend ED evaluation at [facility]. Recommend that you start counseling  Follow up in one month or sooner if needed.  Will fill out fmla/leave tyep  forms when you send those to me.     Esperanza Richters, PA-C   Time spent with patient today was  41 minutes which consisted of chart review, discussing diagnosis, work up, treatment, counseling on recent diagnosis and documentation.

## 2023-02-08 NOTE — Patient Instructions (Addendum)
Prostates cancer- Pet scan pending. Urologist told you he would order. Recommend that you call his office on Thursday for update. If not ordered by thursday yet let me know.  For smoking cessation recommend use nicotine patches. Decided against rx welllbutrin.  For depression and anxiety recommend continue effexor and buspar. If you have thought of harm to self or plans recommend ED evaluation at [facility]. Recommend that you start counseling  Follow up in one month or sooner if needed.  Will fill out fmla/leave type  forms when you send those to me.

## 2023-02-11 ENCOUNTER — Telehealth: Payer: Self-pay | Admitting: Medical

## 2023-02-11 NOTE — Telephone Encounter (Signed)
Spoke with pt, pt wanted to keep updated PCP that he is waiting for his Urologist to get his CT scan, pt also mentioned that our office will receive FMLA paperwork that will be needing to be filled out for him. Pt just wanted provider to be aware.

## 2023-02-11 NOTE — Telephone Encounter (Signed)
Provider is aware of FMLA

## 2023-02-14 ENCOUNTER — Telehealth: Payer: Self-pay | Admitting: Medical

## 2023-02-14 NOTE — Telephone Encounter (Signed)
Patient called and  stated he has been referred to [facility] and they suggested him getting a Pet Scan but has heard nothing back. He states Dr. Drue Novel said  if he did not hear anything then to let him know so he can continue with next steps. Please call

## 2023-02-15 ENCOUNTER — Telehealth: Payer: Self-pay | Admitting: Medical

## 2023-02-15 NOTE — Telephone Encounter (Signed)
Spoke with Kathie Rhodes at [facility] --    Dr.Bell ordered PET-psm scan on 02/03/23 -- Kathie Rhodes in triage didn't know where the scan was sent , stated she will do some investigation on the order and then call pt back.   Pt called and made him aware we spoke with [facility] , and to check back in with the urology office if no follow up call is received by weds.

## 2023-02-15 NOTE — Telephone Encounter (Signed)
Pt has prostate cancer. He told me urologist was going to order the PET scan. This would need a prior authorization and would likely go thru if urologist ordered. Will you call [facility] at [phone] and ask are they going to order as pt tells me urologist was going to place the order. I don't  see order  in epic but don't know if I can see things they order?. Pt is expecting pet scan and expressing anxiety about the delay. Will you call urology for update.  Let me know what they say. Is pet scan in the plan? Who is to order.

## 2023-02-15 NOTE — Telephone Encounter (Signed)
Refer to original phone note

## 2023-02-15 NOTE — Telephone Encounter (Signed)
I do not believe I have spoken with the patient.  Forward note to PCP

## 2023-02-15 NOTE — Telephone Encounter (Signed)
Pt wants to know If you can order a pet scan , urology is wanting a full body pet scan to rule out possible spread of cancer throughout body. Patient stated he has been waiting 2 weeks for their office to place orders and is worried

## 2023-02-15 NOTE — Telephone Encounter (Signed)
Nichols, Walter Dredge, PA-C1 hour ago (11:55 AM)    Pt has prostate cancer. He told me urologist was going to order the PET scan. This would need a prior authorization and would likely go thru if urologist ordered. Will you call [facility] at [phone] and ask are they going to order as pt tells me urologist was going to place the order. I don't  see order  in epic but don't know if I can see things they order?. Pt is expecting pet scan and expressing anxiety about the delay. Will you call urology for update.   Let me know what they say. Is pet scan in the plan? Who is to order.

## 2023-02-16 ENCOUNTER — Other Ambulatory Visit (HOSPITAL_COMMUNITY): Payer: Self-pay | Admitting: Urology

## 2023-02-16 DIAGNOSIS — C61 Malignant neoplasm of prostate: Secondary | ICD-10-CM

## 2023-02-17 ENCOUNTER — Telehealth: Payer: Self-pay | Admitting: Radiation Oncology

## 2023-02-17 NOTE — Telephone Encounter (Signed)
Called patient to schedule a consultation w. Dr. Manning. No answer, unable to LVM, line just rings.  

## 2023-02-18 ENCOUNTER — Telehealth: Payer: Self-pay | Admitting: Radiation Oncology

## 2023-02-18 NOTE — Telephone Encounter (Signed)
Called patient to schedule a consultation w. Dr. Manning. No answer, unable to LVM, line just rings.  

## 2023-02-22 ENCOUNTER — Telehealth: Payer: Self-pay | Admitting: Medical

## 2023-02-22 NOTE — Telephone Encounter (Signed)
Pt has called to inform us that his PET scan will be completed June 14th, 2024. Also he needs his FMLA paperwork to be completed. Paperwork has already been sent. Please advise

## 2023-02-22 NOTE — Telephone Encounter (Signed)
Patient is aware of upcoming appointment time/dates

## 2023-02-23 ENCOUNTER — Telehealth: Payer: Self-pay | Admitting: Radiation Oncology

## 2023-02-23 NOTE — Telephone Encounter (Signed)
Called patients mother to schedule patient for a consultation w. Dr. Kathrynn Running. No answer, LVM for a return call.

## 2023-02-23 NOTE — Telephone Encounter (Signed)
Sent unable to contact letter 6/12. 

## 2023-02-24 NOTE — Telephone Encounter (Signed)
Pt does want letter while he waits on the specialist fills out FMLA and PET SCAN is tomorrow.

## 2023-02-25 ENCOUNTER — Encounter (HOSPITAL_COMMUNITY)
Admission: RE | Admit: 2023-02-25 | Discharge: 2023-02-25 | Disposition: A | Discharge status: Home / Self Care | Payer: 59 | Source: Ambulatory Visit | Attending: Urology | Admitting: Urology

## 2023-02-25 DIAGNOSIS — C61 Malignant neoplasm of prostate: Secondary | ICD-10-CM | POA: Insufficient documentation

## 2023-02-25 MED ORDER — PIFLIFOLASTAT F 18 (PYLARIFY) INJECTION
9.0000 | Freq: Once | INTRAVENOUS | Status: AC
  Administered 2023-02-25: 9.19 via INTRAVENOUS

## 2023-02-28 NOTE — Telephone Encounter (Signed)
Letter e-mailed to pt per request.

## 2023-03-04 ENCOUNTER — Telehealth: Payer: Self-pay | Admitting: Radiation Oncology

## 2023-03-04 NOTE — Telephone Encounter (Signed)
Have not been able to contact patient to schedule consultation w. Dr. Kathrynn Running. Closing referral until further notice. Dr. Shannan Harper office notified.

## 2023-03-10 ENCOUNTER — Telehealth: Payer: Self-pay | Admitting: Medical

## 2023-03-10 NOTE — Telephone Encounter (Signed)
Pt is stating [employer] is denying his extension for fmla papers and is wondering if there is anything we can do. 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  He has an appt with pcp 7/9 but is really needing something to be done sooner to not lose his job. Please advise.

## 2023-03-11 ENCOUNTER — Ambulatory Visit: Payer: 59 | Admitting: Medical

## 2023-03-14 NOTE — Telephone Encounter (Signed)
Spoke with pt this AM , made him aware office has been trying to call him. Gave him office number to call back and schedule

## 2023-03-14 NOTE — Telephone Encounter (Signed)
Spoke with pt he stated urology is not doing FMLA pending surgery, told him to ask urology to write him an extension letter as well. Also made pt aware that oncology has been trying to call him and gave him their number to call  Pt stated he also has not sent in  the extension letter that you wrote to his job - stated he'd call back with their fax number Pt has a follow up appt 03/22/23 with PCP

## 2023-03-14 NOTE — Telephone Encounter (Signed)
Extension letter from PCP faxed

## 2023-03-14 NOTE — Telephone Encounter (Signed)
Pt called to provide fax number #[phone] for FMLA paperwork.

## 2023-03-16 ENCOUNTER — Ambulatory Visit: Payer: 59 | Admitting: Medical

## 2023-03-18 ENCOUNTER — Other Ambulatory Visit: Payer: Self-pay | Admitting: Medical

## 2023-03-21 ENCOUNTER — Telehealth: Payer: Self-pay | Admitting: Medical

## 2023-03-21 NOTE — Telephone Encounter (Signed)
Pt has a fmla form that needs to be filled out. He stated urgently but I was not in office when he called about one week ago. Please get him scheduled wed, Thursday or Friday for office appointment.

## 2023-03-21 NOTE — Telephone Encounter (Signed)
Patient has appt on 03/22/23 already.

## 2023-03-22 ENCOUNTER — Ambulatory Visit (INDEPENDENT_AMBULATORY_CARE_PROVIDER_SITE_OTHER): Payer: 59 | Admitting: Medical

## 2023-03-22 VITALS — BP 122/82 | HR 64 | Resp 18 | Ht 74.0 in | Wt 282.0 lb

## 2023-03-22 DIAGNOSIS — F329 Major depressive disorder, single episode, unspecified: Secondary | ICD-10-CM | POA: Diagnosis not present

## 2023-03-22 DIAGNOSIS — F419 Anxiety disorder, unspecified: Secondary | ICD-10-CM | POA: Diagnosis not present

## 2023-03-22 DIAGNOSIS — F1910 Other psychoactive substance abuse, uncomplicated: Secondary | ICD-10-CM | POA: Diagnosis not present

## 2023-03-22 DIAGNOSIS — C61 Malignant neoplasm of prostate: Secondary | ICD-10-CM | POA: Diagnosis not present

## 2023-03-22 MED ORDER — TRAZODONE HCL 50 MG PO TABS: 25.0000 mg | ORAL_TABLET | Freq: Every evening | ORAL | 3 refills | Status: DC | PRN

## 2023-03-22 NOTE — Progress Notes (Signed)
Subjective:    Patient ID: Walter Nichols, male    DOB: 22-Mar-1971, 52 y.o.   MRN: [id number]  HPI  Pt has prostate CA. Pt states he will have surgery mid August. PET Scan came back negative for metastasis.    IMPRESSION: 1. Tracer avid paramidline primary or primaries, base to apex. 2. No evidence of tracer avid metastatic disease. 3. Incidental findings, including: Sinus disease. Aortic Atherosclerosis (ICD10-I70.0).   Pt has been off from work.   I had wanted urologist to fill out fmla form but they did not. Did write letter extending pt leave.   Pt had describe his mood, anxiety and was tempted not to relapse with substance abuse.  He states last 3-4 weeks he has mostly been in bed with very erratic sleep pattern. Also a lot of family stress with his children. He admits some drinking alcohol. Also some coccain. use.  He is on effexor 150 mg daily and buspar 15 mg daily. Pt feels like he need more potent med for depression.     Review of Systems  Constitutional:  Negative for fatigue.  HENT:  Negative for congestion and drooling.   Respiratory:  Negative for cough, chest tightness, shortness of breath and wheezing.   Cardiovascular:  Negative for chest pain and palpitations.  Gastrointestinal:  Negative for abdominal pain.  Musculoskeletal:  Negative for back pain.  Neurological:  Negative for dizziness, numbness and headaches.  Hematological:  Negative for adenopathy. Does not bruise/bleed easily.  Psychiatric/Behavioral:  Positive for dysphoric mood. Negative for behavioral problems, confusion, sleep disturbance and suicidal ideas. The patient is nervous/anxious.     Past Medical History:  Diagnosis Date   Alcoholism (HCC)    Anal fissure    Anxiety    Depression    Hypertension    Migraine      Social History   Socioeconomic History   Marital status: Single    Spouse name: Not on file   Number of children: Not on file   Years of education: Not on file    Highest education level: Not on file  Occupational History   Not on file  Tobacco Use   Smoking status: Every Day    Packs/day: .5    Types: Cigarettes   Smokeless tobacco: Never  Vaping Use   Vaping Use: Never used  Substance and Sexual Activity   Alcohol use: Not Currently    Comment: 40 oz a day/ocassionally    Drug use: No    Comment: in the past used coccaine. 3 yrs ago.   Sexual activity: Never  Other Topics Concern   Not on file  Social History Narrative   Not on file   Social Determinants of Health   Financial Resource Strain: Not on file  Food Insecurity: Not on file  Transportation Needs: Not on file  Physical Activity: Not on file  Stress: Not on file  Social Connections: Not on file  Intimate Partner Violence: Not on file    Past Surgical History:  Procedure Laterality Date   HEMORROIDECTOMY     x2    Family History  Problem Relation Age of Onset   Lung cancer Maternal Grandmother    Colon cancer Neg Hx    Esophageal cancer Neg Hx    Rectal cancer Neg Hx    Stomach cancer Neg Hx     No Known Allergies  Current Outpatient Medications on File Prior to Visit  Medication Sig Dispense Refill   AMBULATORY NON  FORMULARY MEDICATION Nitroglycerin ointment 0.125% apply BID rectally for 6-8 weeks 30 g 0   amLODipine (NORVASC) 10 MG tablet TAKE 1 TABLET BY MOUTH EVERY DAY 90 tablet 0   ASPIRIN 81 PO Take 1 tablet by mouth daily.     atorvastatin (LIPITOR) 10 MG tablet TAKE 1 TABLET BY MOUTH EVERY DAY 90 tablet 1   atorvastatin (LIPITOR) 10 MG tablet TAKE 1 TABLET BY MOUTH EVERY DAY 90 tablet 1   busPIRone (BUSPAR) 15 MG tablet TAKE 1 TABLET BY MOUTH 3 TIMES DAILY. 270 tablet 1   Cyanocobalamin (BL VITAMIN B-12 PO) Take 1 tablet by mouth daily.     cyclobenzaprine (FLEXERIL) 5 MG tablet 1 tab po q hs prn tension ha 7 tablet 0   famotidine (PEPCID) 20 MG tablet TAKE 1 TABLET BY MOUTH EVERY DAY 90 tablet 1   fenofibrate (TRICOR) 48 MG tablet TAKE 1 TABLET BY  MOUTH EVERY DAY 90 tablet 0   losartan (COZAAR) 25 MG tablet TAKE 1 TABLET (25 MG TOTAL) BY MOUTH DAILY. 90 tablet 2   meloxicam (MOBIC) 15 MG tablet Take 1 tablet (15 mg total) by mouth daily. 30 tablet 0   Multiple Vitamins-Minerals (MULTIVITAMIN WITH MINERALS) tablet Take 1 tablet by mouth as needed (3 times a week. 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      nicotine (NICODERM CQ - DOSED IN MG/24 HOURS) 21 mg/24hr patch Place 1 patch (21 mg total) onto the skin daily. 28 patch 0   sildenafil (VIAGRA) 50 MG tablet 1 tab po as needed one hour prior to sex 10 tablet 0   traZODone (DESYREL) 50 MG tablet TAKE 1/2 TAB AT NIGHT 45 tablet 1   traZODone (DESYREL) 50 MG tablet TAKE 1/2 TO 1 TABLET BY MOUTH AT BEDTIME AS NEEDED FOR SLEEP 90 tablet 2   triamcinolone (NASACORT) 55 MCG/ACT AERO nasal inhaler as needed.     venlafaxine XR (EFFEXOR XR) 150 MG 24 hr capsule Take 1 capsule (150 mg total) by mouth daily with breakfast. 30 capsule 11   VITAMIN A PO Take 1 tablet by mouth daily.     VITAMIN D PO Take 1 tablet by mouth daily.     Vitamin D, Ergocalciferol, (DRISDOL) 1.25 MG (50000 UNIT) CAPS capsule TAKE 1 CAPSULE (50,000 UNITS TOTAL) BY MOUTH EVERY 7 (SEVEN) DAYS 4 capsule 1   VITAMIN E PO Take 1 tablet by mouth daily.     No current facility-administered medications on file prior to visit.    BP 122/82   Pulse 64   Resp 18   Ht 6\' 2"  (1.88 m)   Wt 282 lb (127.9 kg)   SpO2 99%   BMI 36.21 kg/m        Objective:   Physical Exam  General Mental Status- Alert. General Appearance- Not in acute distress.   Skin General: Color- Normal Color. Moisture- Normal Moisture.  Neck Carotid Arteries- Normal color. Moisture- Normal Moisture. No carotid bruits. No JVD.  Chest and Lung Exam Auscultation: Breath Sounds:-Normal.  Cardiovascular Auscultation:Rythm- Regular. Murmurs & Other Heart Sounds:Auscultation of the heart reveals- No Murmurs.  Abdomen Inspection:-Inspeection  Normal. Palpation/Percussion:Note:No mass. Palpation and Percussion of the abdomen reveal- Non Tender, Non Distended + BS, no rebound or guarding.    Neurologic Cranial Nerve exam:- CN III-XII intact(No nystagmus), symmetric smile. Drift Test:- No drift. Romberg Exam:- Negative.  Heal to Toe Gait exam:-Normal. Finger to Nose:- Normal/Intact Strength:- 5/5 equal and symmetric strength both upper and lower extremities.  Assessment & Plan:   Patient Instructions  1. Prostate cancer [facility]) Upcoming surgery in August.  2. Anxiety On buspar 15 mg three times daily  3. Reactive depression Continue effexor 150 mg daily. I think best to refer you to both psychiatrist and counseling. Concern if adding on med can cause excess serotinin surge.  4. Substance abuse (HCC) Recommend abstain. I think referral will help you abstain with all that is going on.   Filled out fmla form today explaining day missed/prolonged absences.  Follow up one month or sooner if needed.     Esperanza Richters, PA-C   Time spent with patient today was 41  minutes which consisted of chart review, discussing diagnosis, work up, treatment, filled out long fmla formand documentation.

## 2023-03-22 NOTE — Patient Instructions (Addendum)
1. Prostate cancer (HCC) Upcoming surgery in August.  2. Anxiety On buspar 15 mg three times daily  3. Reactive depression Continue effexor 150 mg daily. I think best to refer you to both psychiatrist and counseling. Concern if adding on med can cause excess serotinin surge. Will be willing to add on trazdone to help you sleep.  4. Substance abuse (HCC) Recommend abstain. I think referral will help you abstain with all that is going on.   Filled out fmla form today explaining day missed/prolonged absences.  Follow up one month or sooner if needed.

## 2023-03-28 ENCOUNTER — Other Ambulatory Visit: Payer: Self-pay | Admitting: Urology

## 2023-04-11 ENCOUNTER — Other Ambulatory Visit: Payer: Self-pay | Admitting: Genetic Counselor

## 2023-04-11 ENCOUNTER — Encounter: Payer: Self-pay | Admitting: Genetic Counselor

## 2023-04-11 ENCOUNTER — Inpatient Hospital Stay (HOSPITAL_BASED_OUTPATIENT_CLINIC_OR_DEPARTMENT_OTHER): Payer: 59 | Admitting: Genetic Counselor

## 2023-04-11 ENCOUNTER — Other Ambulatory Visit: Payer: Self-pay

## 2023-04-11 ENCOUNTER — Inpatient Hospital Stay: Payer: 59 | Attending: Genetic Counselor

## 2023-04-11 DIAGNOSIS — Z801 Family history of malignant neoplasm of trachea, bronchus and lung: Secondary | ICD-10-CM

## 2023-04-11 DIAGNOSIS — Z8042 Family history of malignant neoplasm of prostate: Secondary | ICD-10-CM

## 2023-04-11 DIAGNOSIS — C61 Malignant neoplasm of prostate: Secondary | ICD-10-CM | POA: Insufficient documentation

## 2023-04-11 LAB — GENETIC SCREENING ORDER

## 2023-04-11 NOTE — Progress Notes (Signed)
REFERRING PROVIDER: Crista Elliot, MD [address] [place],  [place] [address]   PRIMARY PROVIDER:  Esperanza Richters, PA-C  PRIMARY REASON FOR VISIT:  Encounter Diagnoses  Name Primary?   Family history of prostate cancer    Prostate cancer (HCC) Yes    HISTORY OF PRESENT ILLNESS:   Walter Nichols, a 52 y.o. male, was seen for a [facility] cancer genetics consultation at the request of Dr. Alvester Morin  due to a personal history of prostate cancer.  Walter Nichols presents to clinic today to discuss the possibility of a hereditary predisposition to cancer, to discuss genetic testing, and to further clarify his future cancer risks, as well as potential cancer risks for family members.   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 The treatment plan includes radical prostatectomy.       RISK FACTORS:  Colonoscopy: yes;  most recent in 2021; 10 year f/u .   Past Medical History:  Diagnosis Date   Alcoholism (HCC)    Anal fissure    Anxiety    Depression    Hypertension    Migraine     Past Surgical History:  Procedure Laterality Date   HEMORROIDECTOMY     x2    FAMILY HISTORY:  We obtained a detailed, 4-generation family history.  Significant diagnoses are listed below: Family History  Problem Relation Age of Onset   Prostate cancer Maternal Uncle 35   Lung cancer Maternal Grandmother 35      Walter Nichols is unaware of previous family history of genetic testing for hereditary cancer risks.   There is no reported Ashkenazi Jewish ancestry. There is no known consanguinity.  GENETIC COUNSELING ASSESSMENT: Walter Nichols is a 52 y.o. male with a personal and family history of prostate which is somewhat suggestive of a hereditary cancer syndrome given the ages of diagnosis. We, therefore, discussed and recommended the following at today's visit.   DISCUSSION: We discussed that 5 - 10% of cancer is  hereditary.  Most cases of hereditary prostate cancer are associated with mutations in BRCA1/2.  There are other genes that can be associated with hereditary prostate cancer syndromes.  We discussed that testing is beneficial for several reasons including knowing how to follow individuals for their cancer risks and understanding if other family members could be at risk for cancer and allowing them to undergo genetic testing.   We reviewed the characteristics, features and inheritance patterns of hereditary cancer syndromes. We also discussed genetic testing, including the appropriate family members to test, the process of testing, insurance coverage and turn-around-time for results. We discussed the implications of a negative, positive, carrier and/or variant of uncertain significant result. We recommended Walter Nichols pursue genetic testing for a panel that includes genes associated with prostate cancer and other cancers.   The [facility] Common Hereditary Cancers + RNA Panel includes sequencing, deletion/duplication, and RNA analysis of the following 48 genes: APC, ATM, AXIN2, BAP1, BARD1, BMPR1A, BRCA1, BRCA2, BRIP1, CDH1, CDK4*, CDKN2A*, CHEK2, CTNNA1, DICER1, EPCAM* (del/dup only), FH, GREM1* (promoter dup analysis only), HOXB13*, KIT*, MBD4*, MEN1, MLH1, MSH2, MSH3, MSH6, MUTYH, NF1, NTHL1, PALB2, PDGFRA*, PMS2, POLD1, POLE, PTEN, RAD51C, RAD51D, SDHA (sequencing only), SDHB, SDHC, SDHD, SMAD4, SMARCA4, STK11, TP53, TSC1, TSC2, VHL.  *Genes without RNA analysis.   Based on Walter Nichols's personal and family history of prostate before age 84, he meets medical criteria for genetic testing. Despite  that he meets criteria, he may still have an out of pocket cost. We discussed that if his out of pocket cost for testing is over $100, the laboratory should contact him and discuss the self-pay prices and/or patient pay assistance programs.    PLAN: After considering the risks, benefits, and limitations, Walter Nichols  provided informed consent to pursue genetic testing and the blood sample was sent to [facility] for analysis of the Common Hereditary Cancers +RNA Panel. Results should be available within approximately 3 weeks' time, at which point they will be disclosed by telephone to Walter Nichols, as will any additional recommendations warranted by these results. Walter Nichols will receive a summary of his genetic counseling visit and a copy of his results once available. This information will also be available in Epic.   Walter Nichols questions were answered to his satisfaction today. Our contact information was provided should additional questions or concerns arise. Thank you for the referral and allowing [place] to share in the care of your patient.   Donnovan Stamour M. Rennie Plowman, MS, [facility] Genetic Counselor [email or web address] (P) [phone]  The patient was seen for a total of 30 minutes in face-to-face genetic counseling.  The patient was seen alone.  Drs. Gunnar Bulla and/or Mosetta Putt were available to discuss this case as needed.    _______________________________________________________________________ For Office Staff:  Number of people involved in session: 1 Was an Intern/ student involved with case: no

## 2023-04-12 ENCOUNTER — Telehealth: Payer: Self-pay | Admitting: Medical

## 2023-04-12 NOTE — Telephone Encounter (Signed)
Dr. Wandra Feinstein called requesting to speak with provider regarding the pt's short term disability claim and wanted to know if the PA would like to support the claim via a phone call and provide additional information. Please advise.

## 2023-04-13 ENCOUNTER — Inpatient Hospital Stay: Payer: 59 | Admitting: Licensed Clinical Social Worker

## 2023-04-13 DIAGNOSIS — C61 Malignant neoplasm of prostate: Secondary | ICD-10-CM

## 2023-04-13 NOTE — Patient Instructions (Addendum)
SURGICAL WAITING ROOM VISITATION  Patients having surgery or a procedure may have no more than 2 support people in the waiting area - these visitors may rotate.    Children under the age of 10 must have an adult with them who is not the patient.  Due to an increase in RSV and influenza rates and associated hospitalizations, children ages 74 and under may not visit patients in [facility] hospitals.  If the patient needs to stay at the hospital during part of their recovery, the visitor guidelines for inpatient rooms apply. Pre-op nurse will coordinate an appropriate time for 1 support person to accompany patient in pre-op.  This support person may not rotate.    Please refer to the [facility] website for the visitor guidelines for Inpatients (after your surgery is over and you are in a regular room).       Your procedure is scheduled on: 04/28/23   Report to [facility] Main Entrance    Report to admitting at  9 AM   Call this number if you have problems the morning of surgery [phone]   Do not eat food or drink liquids :After Midnight.     FOLLOW BOWEL PREP AND ANY ADDITIONAL PRE OP INSTRUCTIONS YOU RECEIVED FROM YOUR SURGEON'S OFFICE!!!     Oral Hygiene is also important to reduce your risk of infection.                                    Remember - BRUSH YOUR TEETH THE MORNING OF SURGERY WITH YOUR REGULAR TOOTHPASTE  DENTURES WILL BE REMOVED PRIOR TO SURGERY PLEASE DO NOT APPLY "Poly grip" OR ADHESIVES!!!   Stop all vitamins and herbal supplements 7 days before surgery.   Take these medicines the morning of surgery with A SIP OF WATER: Famotidine, Fenofibrate(Tricor), Amlodipine, Atorvastatin             You may not have any metal on your body including hair pins, jewelry, and body piercing             Do not wear make-up, lotions, powders, perfumes/cologne, or deodorant  Do not wear nail polish including gel and S&S, artificial/acrylic nails, or any other  type of covering on natural nails including finger and toenails. If you have artificial nails, gel coating, etc. that needs to be removed by a nail salon please have this removed prior to surgery or surgery may need to be canceled/ delayed if the surgeon/ anesthesia feels like they are unable to be safely monitored.   Do not shave  48 hours prior to surgery.               Men may shave face and neck.   Do not bring valuables to the hospital. [place] [facility] IS NOT             RESPONSIBLE   FOR VALUABLES.   Contacts, glasses, dentures or bridgework may not be worn into surgery.   Bring small overnight bag day of surgery.   DO NOT BRING YOUR HOME MEDICATIONS TO THE HOSPITAL. PHARMACY WILL DISPENSE MEDICATIONS LISTED ON YOUR MEDICATION LIST TO YOU DURING YOUR ADMISSION IN THE HOSPITAL!    Patients discharged on the day of surgery will not be allowed to drive home.  Someone NEEDS to stay with you for the first 24 hours after anesthesia.   Special Instructions: Bring a copy of  your healthcare power of attorney and living will documents the day of surgery if you haven't scanned them before.              Please read over the following fact sheets you were given: IF YOU HAVE QUESTIONS ABOUT YOUR PRE-OP INSTRUCTIONS PLEASE CALL [phone] Rosey [place]   If you received a COVID test during your pre-op visit  it is requested that you wear a mask when out in public, stay away from anyone that may not be feeling well and notify your surgeon if you develop symptoms. If you test positive for Covid or have been in contact with anyone that has tested positive in the last 10 days please notify you surgeon.    [facility] - Preparing for Surgery Before surgery, you can play an important role.  Because skin is not sterile, your skin needs to be as free of germs as possible.  You can reduce the number of germs on your skin by washing with CHG (chlorahexidine gluconate) soap before surgery.  CHG is an antiseptic cleaner  which kills germs and bonds with the skin to continue killing germs even after washing. Please DO NOT use if you have an allergy to CHG or antibacterial soaps.  If your skin becomes reddened/irritated stop using the CHG and inform your nurse when you arrive at Short Stay. Do not shave (including legs and underarms) for at least 48 hours prior to the first CHG shower.  You may shave your face/neck.  Please follow these instructions carefully:  1.  Shower with CHG Soap the night before surgery and the  morning of surgery.  2.  If you choose to wash your hair, wash your hair first as usual with your normal  shampoo.  3.  After you shampoo, rinse your hair and body thoroughly to remove the shampoo.                             4.  Use CHG as you would any other liquid soap.  You can apply chg directly to the skin and wash.  Gently with a scrungie or clean washcloth.  5.  Apply the CHG Soap to your body ONLY FROM THE NECK DOWN.   Do   not use on face/ open                           Wound or open sores. Avoid contact with eyes, ears mouth and   genitals (private parts).                       Wash face,  Genitals (private parts) with your normal soap.             6.  Wash thoroughly, paying special attention to the area where your    surgery  will be performed.  7.  Thoroughly rinse your body with warm water from the neck down.  8.  DO NOT shower/wash with your normal soap after using and rinsing off the CHG Soap.                9.  Pat yourself dry with a clean towel.            10.  Wear clean pajamas.            11.  Place clean sheets on your bed the night of  your first shower and do not  sleep with pets. Day of Surgery : Do not apply any lotions/deodorants the morning of surgery.  Please wear clean clothes to the hospital/surgery center.  FAILURE TO FOLLOW THESE INSTRUCTIONS MAY RESULT IN THE CANCELLATION OF YOUR SURGERY  PATIENT SIGNATURE_________________________________  NURSE  SIGNATURE__________________________________  ________________________________________________________________________ WHAT IS A BLOOD TRANSFUSION? Blood Transfusion Information  A transfusion is the replacement of blood or some of its parts. Blood is made up of multiple cells which provide different functions. Red blood cells carry oxygen and are used for blood loss replacement. White blood cells fight against infection. Platelets control bleeding. Plasma helps clot blood. Other blood products are available for specialized needs, such as hemophilia or other clotting disorders. BEFORE THE TRANSFUSION  Who gives blood for transfusions?  Healthy volunteers who are fully evaluated to make sure their blood is safe. This is blood bank blood. Transfusion therapy is the safest it has ever been in the practice of medicine. Before blood is taken from a donor, a complete history is taken to make sure that person has no history of diseases nor engages in risky social behavior (examples are intravenous drug use or sexual activity with multiple partners). The donor's travel history is screened to minimize risk of transmitting infections, such as malaria. The donated blood is tested for signs of infectious diseases, such as HIV and hepatitis. The blood is then tested to be sure it is compatible with you in order to minimize the chance of a transfusion reaction. If you or a relative donates blood, this is often done in anticipation of surgery and is not appropriate for emergency situations. It takes many days to process the donated blood. RISKS AND COMPLICATIONS Although transfusion therapy is very safe and saves many lives, the main dangers of transfusion include:  Getting an infectious disease. Developing a transfusion reaction. This is an allergic reaction to something in the blood you were given. Every precaution is taken to prevent this. The decision to have a blood transfusion has been considered carefully  by your caregiver before blood is given. Blood is not given unless the benefits outweigh the risks. AFTER THE TRANSFUSION Right after receiving a blood transfusion, you will usually feel much better and more energetic. This is especially true if your red blood cells have gotten low (anemic). The transfusion raises the level of the red blood cells which carry oxygen, and this usually causes an energy increase. The nurse administering the transfusion will monitor you carefully for complications. HOME CARE INSTRUCTIONS  No special instructions are needed after a transfusion. You may find your energy is better. Speak with your caregiver about any limitations on activity for underlying diseases you may have. SEEK MEDICAL CARE IF:  Your condition is not improving after your transfusion. You develop redness or irritation at the intravenous (IV) site. SEEK IMMEDIATE MEDICAL CARE IF:  Any of the following symptoms occur over the next 12 hours: Shaking chills. You have a temperature by mouth above 102 F (38.9 C), not controlled by medicine. Chest, back, or muscle pain. People around you feel you are not acting correctly or are confused. Shortness of breath or difficulty breathing. Dizziness and fainting. You get a rash or develop hives. You have a decrease in urine output. Your urine turns a dark color or changes to pink, red, or brown. Any of the following symptoms occur over the next 10 days: You have a temperature by mouth above 102 F (38.9 C), not controlled by  medicine. Shortness of breath. Weakness after normal activity. The white part of the eye turns yellow (jaundice). You have a decrease in the amount of urine or are urinating less often. Your urine turns a dark color or changes to pink, red, or brown. Document Released: 08/27/2000 Document Revised: 11/22/2011 Document Reviewed: 04/15/2008 [facility] Patient Information 2014 [place], [place].

## 2023-04-13 NOTE — Progress Notes (Signed)
COVID Vaccine received:  []  No [x]  Yes Date of any COVID positive Test in last 90 days: No PCP - Esperanza Richters PA-C Cardiologist -   Chest x-ray - 11/30/18 Epic EKG -  04/14/23 EPIC Stress Test - no ECHO - no Cardiac Cath - no  Bowel Prep - [x]  No  []   Yes ______  Pacemaker / ICD device [x]  No []  Yes   Spinal Cord Stimulator:[x]  No []  Yes       History of Sleep Apnea? []  No [x]  Yes   CPAP used?- [x]  No []  Yes    Does the patient monitor blood sugar?          [x]  No []  Yes  []  N/A  Patient has: [x]  NO Hx DM   []  Pre-DM                 []  DM1  []   DM2 Does patient have a [employer] or [employer]? []  No []  Yes   Fasting Blood Sugar Ranges-  Checks Blood Sugar _____ times a day  GLP1 agonist / usual dose - No GLP1 instructions:  SGLT-2 inhibitors / usual dose - No SGLT-2 instructions:   Blood Thinner / Instructions:No Aspirin Instructions:No  Comments:   Activity level: Patient is able to climb a flight of stairs without difficulty; [x]  No CP  [x]  No SOB  Patient can  perform ADLs without assistance.   Anesthesia review:   Patient denies shortness of breath, fever, cough and chest pain at PAT appointment.  Patient verbalized understanding and agreement to the Pre-Surgical Instructions that were given to them at this PAT appointment. Patient was also educated of the need to review these PAT instructions again prior to his/her surgery.I reviewed the appropriate phone numbers to call if they have any and questions or concerns.

## 2023-04-13 NOTE — Progress Notes (Signed)
[facility] CSW Progress Note  Visual merchandiser met with patient to provide w/ resources.  Pt given instructions to register w/ [facility], [facility], [facility] and to follow up w/ [facility] for supporting medical documentation.    Rachel Moulds, LCSW Clinical Social Worker [facility]

## 2023-04-13 NOTE — Progress Notes (Signed)
[facility] CSW Progress Note  Visual merchandiser  received a call from pt regarding financial concerns.  Pt will be undergoing surgery with [facility] due to prostate cancer.  At present pt does not have a referral to any providers at the cancer center.  CSW spoke w/ pt about resources pt could apply for to assist w/ medical expenses not covered by insurance, explaining the medical information required could be supplied by [facility].  Pt does not presently qualify for any financial support at the cancer center.  Pt scheduled to meet w/ CSW this morning to further discuss resources; however, pt did not come to the appointment.  CSW attempted to reach pt by phone, pt did not answer and voicemail was not available.        Rachel Moulds, LCSW Clinical Social Worker [facility]

## 2023-04-14 ENCOUNTER — Encounter (HOSPITAL_COMMUNITY)
Admission: RE | Admit: 2023-04-14 | Discharge: 2023-04-14 | Disposition: A | Discharge status: Home / Self Care | Payer: 59 | Source: Ambulatory Visit | Attending: Urology | Admitting: Urology

## 2023-04-14 ENCOUNTER — Other Ambulatory Visit: Payer: Self-pay

## 2023-04-14 ENCOUNTER — Encounter (HOSPITAL_COMMUNITY): Payer: Self-pay

## 2023-04-14 ENCOUNTER — Telehealth: Payer: Self-pay | Admitting: Medical

## 2023-04-14 VITALS — BP 115/82 | HR 76 | Temp 98.1°F | Resp 16 | Ht 74.0 in | Wt 277.0 lb

## 2023-04-14 DIAGNOSIS — Z01810 Encounter for preprocedural cardiovascular examination: Secondary | ICD-10-CM | POA: Insufficient documentation

## 2023-04-14 DIAGNOSIS — I10 Essential (primary) hypertension: Secondary | ICD-10-CM | POA: Insufficient documentation

## 2023-04-14 DIAGNOSIS — Z01818 Encounter for other preprocedural examination: Secondary | ICD-10-CM | POA: Diagnosis present

## 2023-04-14 DIAGNOSIS — Z01812 Encounter for preprocedural laboratory examination: Secondary | ICD-10-CM | POA: Diagnosis not present

## 2023-04-14 HISTORY — DX: Malignant (primary) neoplasm, unspecified: C80.1

## 2023-04-14 HISTORY — DX: Sleep apnea, unspecified: G47.30

## 2023-04-14 LAB — BASIC METABOLIC PANEL
Anion gap: 7 (ref 5–15)
BUN: 10 mg/dL (ref 6–20)
CO2: 23 mmol/L (ref 22–32)
Calcium: 9.4 mg/dL (ref 8.9–10.3)
Chloride: 110 mmol/L (ref 98–111)
Creatinine, Ser: 1.17 mg/dL (ref 0.61–1.24)
GFR, Estimated: >60 mL/min (ref >60)
Glucose, Bld: 139 mg/dL — ABNORMAL HIGH (ref 70–99)
Potassium: 3.8 mmol/L (ref 3.5–5.1)
Sodium: 140 mmol/L (ref 135–145)

## 2023-04-14 LAB — CBC
HCT: 43.1 % (ref 39.0–52.0)
Hemoglobin: 14.1 g/dL (ref 13.0–17.0)
MCH: 27.9 pg (ref 26.0–34.0)
MCHC: 32.7 g/dL (ref 30.0–36.0)
MCV: 85.2 fL (ref 80.0–100.0)
Platelets: 360 10*3/uL (ref 150–400)
RBC: 5.06 MIL/uL (ref 4.22–5.81)
RDW: 13.6 % (ref 11.5–15.5)
WBC: 6.2 10*3/uL (ref 4.0–10.5)
nRBC: 0 % (ref 0.0–0.2)

## 2023-04-14 LAB — TYPE AND SCREEN
ABO/RH(D): A POS
Antibody Screen: NEGATIVE

## 2023-04-14 NOTE — Telephone Encounter (Signed)
Dr. Graciela Husbands from [facility] called and requested to speak with Walter Nichols when he is back in office to discuss the pt's disability claim. Call back number is # [phone].

## 2023-04-18 ENCOUNTER — Telehealth: Payer: Self-pay | Admitting: Medical

## 2023-04-18 NOTE — Telephone Encounter (Signed)
Refer to other phone note . 

## 2023-04-18 NOTE — Telephone Encounter (Signed)
Appt scheduled

## 2023-04-18 NOTE — Telephone Encounter (Signed)
Dr.Etikerentse stated he was calling to go over pt's disability claim with pcp. He is hoping to go over this today and his number is [phone]

## 2023-04-18 NOTE — Telephone Encounter (Signed)
I talked with MD from [facility]. Got verbal authorization first. Explained med hx. They said would approves day until october same as urologist. Called back and notified pt. He also has appt on wed.

## 2023-04-19 ENCOUNTER — Ambulatory Visit: Payer: Self-pay | Admitting: Genetic Counselor

## 2023-04-19 ENCOUNTER — Telehealth: Payer: Self-pay | Admitting: Genetic Counselor

## 2023-04-19 ENCOUNTER — Encounter: Payer: Self-pay | Admitting: Genetic Counselor

## 2023-04-19 DIAGNOSIS — Z8042 Family history of malignant neoplasm of prostate: Secondary | ICD-10-CM

## 2023-04-19 DIAGNOSIS — Z1379 Encounter for other screening for genetic and chromosomal anomalies: Secondary | ICD-10-CM | POA: Insufficient documentation

## 2023-04-19 DIAGNOSIS — C61 Malignant neoplasm of prostate: Secondary | ICD-10-CM

## 2023-04-19 NOTE — Telephone Encounter (Signed)
Disclosed negative genetics.    

## 2023-04-19 NOTE — Progress Notes (Signed)
HPI:   [name] was previously seen in the [facility] clinic due to a personal and family history of prostate cancer and concerns regarding a hereditary predisposition to cancer.    [name] recent genetic test results were disclosed to him by telephone. These results and recommendations are discussed in more detail below.  CANCER HISTORY:  In May 2024, at the age of 52, 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 The treatment plan includes radical prostatectomy.     Oncology History  Prostate cancer (HCC)  04/11/2023 Initial Diagnosis   Prostate cancer (HCC)   04/17/2023 Genetic Testing   Negative [facility] Common Hereditary Cancers +RNA Panel.  Report date is 04/17/2023.    The [facility] Common Hereditary Cancers + RNA Panel includes sequencing, deletion/duplication, and RNA analysis of the following 48 genes: APC, ATM, AXIN2, BAP1, BARD1, BMPR1A, BRCA1, BRCA2, BRIP1, CDH1, CDK4*, CDKN2A*, CHEK2, CTNNA1, DICER1, EPCAM* (del/dup only), FH, GREM1* (promoter dup analysis only), HOXB13*, KIT*, MBD4*, MEN1, MLH1, MSH2, MSH3, MSH6, MUTYH, NF1, NTHL1, PALB2, PDGFRA*, PMS2, POLD1, POLE, PTEN, RAD51C, RAD51D, SDHA (sequencing only), SDHB, SDHC, SDHD, SMAD4, SMARCA4, STK11, TP53, TSC1, TSC2, VHL.  *Genes without RNA analysis.      FAMILY HISTORY:  We obtained a detailed, 4-generation family history.  Significant diagnoses are listed below:      Family History  Problem Relation Age of Onset   Prostate cancer Maternal Uncle 1   Lung cancer Maternal Grandmother 85         [name] is unaware of previous family history of genetic testing for hereditary cancer risks.    There is no reported Ashkenazi Jewish ancestry. There is no known consanguinity.    GENETIC TEST RESULTS:  The [facility] Common Hereditary Cancers +RNA Panel found no pathogenic mutations.    The [facility] Common Hereditary Cancers + RNA Panel includes  sequencing, deletion/duplication, and RNA analysis of the following 48 genes: APC, ATM, AXIN2, BAP1, BARD1, BMPR1A, BRCA1, BRCA2, BRIP1, CDH1, CDK4*, CDKN2A*, CHEK2, CTNNA1, DICER1, EPCAM* (del/dup only), FH, GREM1* (promoter dup analysis only), HOXB13*, KIT*, MBD4*, MEN1, MLH1, MSH2, MSH3, MSH6, MUTYH, NF1, NTHL1, PALB2, PDGFRA*, PMS2, POLD1, POLE, PTEN, RAD51C, RAD51D, SDHA (sequencing only), SDHB, SDHC, SDHD, SMAD4, SMARCA4, STK11, TP53, TSC1, TSC2, VHL.  *Genes without RNA analysis.    The test report has been scanned into EPIC and is located under the Molecular Pathology section of the Results Review tab.  A portion of the result report is included below for reference. Genetic testing reported out on April 17, 2023.      Even though a pathogenic variant was not identified, possible explanations for the cancer in the family may include: There may be no hereditary risk for cancer in the family. The cancers in [name] and/or his family may be sporadic/familial or due to other genetic and environmental factors. There may be a gene mutation in one of these genes that current testing methods cannot detect but that chance is small. There could be another gene that has not yet been discovered, or that we have not yet tested, that is responsible for the cancer diagnoses in the family.  It is also possible there is a hereditary cause for the cancer in the family that [name] did not inherit.   Therefore, it is important to remain in touch with cancer genetics in the future so that we can continue to offer [name] the most up to date genetic testing.  ADDITIONAL GENETIC TESTING:   [name] genetic testing was fairly extensive.  If there are additional relevant genes identified to increase cancer risk that can be analyzed in the future, we would be happy to discuss and coordinate this testing at that time.     CANCER SCREENING RECOMMENDATIONS:  [name] test result is considered negative  (normal).  This means that we have not identified a hereditary cause for his personal history of prostate cancer at this time.    An individual's cancer risk and medical management are not determined by genetic test results alone. Overall cancer risk assessment incorporates additional factors, including personal medical history, family history, and any available genetic information that may result in a personalized plan for cancer prevention and surveillance. Therefore, it is recommended he continue to follow the cancer management and screening guidelines provided by his oncology and primary healthcare provider.  RECOMMENDATIONS FOR FAMILY MEMBERS:   Since he did not inherit a identifiable mutation in a cancer predisposition gene included on this panel, his children could not have inherited a known mutation from him in one of these genes. Individuals in this family might be at some increased risk of developing cancer, over the general population risk, due to the family history of cancer.  Individuals in the family should notify their providers of the family history of cancer. We recommend women in this family have a yearly mammogram beginning at age 6, or 75 years younger than the earliest onset of cancer, an annual clinical breast exam, and perform monthly breast self-exams.  Risk models that take into account family history and hormonal history may be helpful in determining appropriate breast cancer screening options for family members. Male relatives should speak with their providers about prostate cancer screening.   FOLLOW-UP:  Cancer genetics is a rapidly advancing field and it is possible that new genetic tests will be appropriate for him and/or his family members in the future. We encourage [name] to remain in contact with cancer genetics, so we can update his personal and family histories and let him know of advances in cancer genetics that may benefit this family.   Our contact number was  provided.  He is welcome to call us at anytime with additional questions or concerns.    [name], MS, [facility] Genetic Counselor [email or web address] (P) [phone]

## 2023-04-19 NOTE — Telephone Encounter (Signed)
Called in attempt to disclose negative genetics results.  Patient at appt and unable to talk.  York Spaniel he would call back later today.

## 2023-04-20 ENCOUNTER — Ambulatory Visit (INDEPENDENT_AMBULATORY_CARE_PROVIDER_SITE_OTHER): Payer: 59 | Admitting: Medical

## 2023-04-20 ENCOUNTER — Other Ambulatory Visit: Payer: Self-pay | Admitting: Medical

## 2023-04-20 VITALS — BP 118/72 | HR 64 | Temp 98.0°F | Resp 18 | Ht 74.0 in | Wt 275.0 lb

## 2023-04-20 DIAGNOSIS — F329 Major depressive disorder, single episode, unspecified: Secondary | ICD-10-CM | POA: Diagnosis not present

## 2023-04-20 DIAGNOSIS — C61 Malignant neoplasm of prostate: Secondary | ICD-10-CM | POA: Diagnosis not present

## 2023-04-20 DIAGNOSIS — F419 Anxiety disorder, unspecified: Secondary | ICD-10-CM

## 2023-04-20 DIAGNOSIS — F1910 Other psychoactive substance abuse, uncomplicated: Secondary | ICD-10-CM

## 2023-04-20 NOTE — Progress Notes (Signed)
Subjective:    Patient ID: Walter Nichols, male    DOB: 1971/08/26, 52 y.o.   MRN: [id number]  HPI Pt in for follow up from   Last visit below in "  "1. Prostate cancer [facility]) Upcoming surgery in August.   2. Anxiety On buspar 15 mg three times daily   3. Reactive depression Continue effexor 150 mg daily. I think best to refer you to both psychiatrist and counseling. Concern if adding on med can cause excess serotinin surge.   4. Substance abuse (HCC) Recommend abstain. I think referral will help you abstain with all that is going on.    Filled out fmla form today explaining day missed/prolonged absences."  Pt had some issues getting clearance/approval for time of just recently. Now he is trying to get funds for days that he has not worked. He has not received paycheck for 2 months.   He has been stressed, anxious and depressed over prostate cancer dx and financial issues. Some frustration dealing with his dad who lives with him.  Surgery is scheduled for next Thursday.     Review of Systems  Constitutional:  Negative for chills and fatigue.  HENT:  Negative for congestion.   Respiratory:  Negative for cough, chest tightness, shortness of breath and wheezing.   Cardiovascular:  Negative for chest pain and palpitations.  Gastrointestinal:  Negative for abdominal pain, nausea and vomiting.  Genitourinary:  Negative for frequency, hematuria and testicular pain.  Musculoskeletal:  Negative for back pain.  Skin:  Negative for rash.  Neurological:  Negative for dizziness, seizures and headaches.  Hematological:  Negative for adenopathy.  Psychiatric/Behavioral:  Positive for dysphoric mood. Negative for behavioral problems, confusion and suicidal ideas. The patient is nervous/anxious.     Past Medical History:  Diagnosis Date   Alcoholism (HCC)    Anal fissure    Anxiety    Cancer (HCC)    prostate   Depression    Hypertension    Migraine    Sleep apnea      Social  History   Socioeconomic History   Marital status: Single    Spouse name: Not on file   Number of children: Not on file   Years of education: Not on file   Highest education level: Not on file  Occupational History   Not on file  Tobacco Use   Smoking status: Every Day    Current packs/day: 0.50    Types: Cigarettes   Smokeless tobacco: Never  Vaping Use   Vaping status: Never Used  Substance and Sexual Activity   Alcohol use: Not Currently    Comment: 40 oz a day/ocassionally    Drug use: Not Currently    Comment: in the past used coccaine. 3 yrs ago.   Sexual activity: Never  Other Topics Concern   Not on file  Social History Narrative   Not on file   Social Determinants of Health   Financial Resource Strain: Not on file  Food Insecurity: Not on file  Transportation Needs: Not on file  Physical Activity: Not on file  Stress: Not on file  Social Connections: Not on file  Intimate Partner Violence: Not on file    Past Surgical History:  Procedure Laterality Date   HEMORROIDECTOMY     x2    Family History  Problem Relation Age of Onset   Prostate cancer Maternal Uncle 77   Lung cancer Maternal Grandmother 72   Colon cancer Neg Hx  Esophageal cancer Neg Hx    Rectal cancer Neg Hx    Stomach cancer Neg Hx     No Known Allergies  Current Outpatient Medications on File Prior to Visit  Medication Sig Dispense Refill   AMBULATORY NON FORMULARY MEDICATION Nitroglycerin ointment 0.125% apply BID rectally for 6-8 weeks 30 g 0   amLODipine (NORVASC) 10 MG tablet TAKE 1 TABLET BY MOUTH EVERY DAY 90 tablet 0   atorvastatin (LIPITOR) 10 MG tablet TAKE 1 TABLET BY MOUTH EVERY DAY (Patient not taking: Reported on 04/08/2023) 90 tablet 1   atorvastatin (LIPITOR) 10 MG tablet TAKE 1 TABLET BY MOUTH EVERY DAY 90 tablet 1   busPIRone (BUSPAR) 15 MG tablet TAKE 1 TABLET BY MOUTH 3 TIMES DAILY. (Patient taking differently: Take 15 mg by mouth 3 (three) times daily as needed  (anxiety).) 270 tablet 1   cyclobenzaprine (FLEXERIL) 5 MG tablet 1 tab po q hs prn tension ha (Patient not taking: Reported on 04/08/2023) 7 tablet 0   docusate sodium (COLACE) 100 MG capsule Take 100 mg by mouth daily.     famotidine (PEPCID) 20 MG tablet TAKE 1 TABLET BY MOUTH EVERY DAY 90 tablet 1   fenofibrate (TRICOR) 48 MG tablet TAKE 1 TABLET BY MOUTH EVERY DAY 90 tablet 0   losartan (COZAAR) 25 MG tablet TAKE 1 TABLET (25 MG TOTAL) BY MOUTH DAILY. 90 tablet 2   meloxicam (MOBIC) 15 MG tablet Take 1 tablet (15 mg total) by mouth daily. (Patient not taking: Reported on 04/08/2023) 30 tablet 0   nicotine (NICODERM CQ - DOSED IN MG/24 HOURS) 21 mg/24hr patch Place 1 patch (21 mg total) onto the skin daily. 28 patch 0   sildenafil (VIAGRA) 50 MG tablet 1 tab po as needed one hour prior to sex 10 tablet 0   traZODone (DESYREL) 50 MG tablet TAKE 1/2 TAB AT NIGHT (Patient not taking: Reported on 04/08/2023) 45 tablet 1   traZODone (DESYREL) 50 MG tablet TAKE 1/2 TO 1 TABLET BY MOUTH AT BEDTIME AS NEEDED FOR SLEEP (Patient not taking: Reported on 04/08/2023) 90 tablet 2   traZODone (DESYREL) 50 MG tablet Take 0.5-1 tablets (25-50 mg total) by mouth at bedtime as needed for sleep. 30 tablet 3   triamcinolone (NASACORT) 55 MCG/ACT AERO nasal inhaler Place 1 spray into the nose daily as needed (allergies).     venlafaxine XR (EFFEXOR XR) 150 MG 24 hr capsule Take 1 capsule (150 mg total) by mouth daily with breakfast. 30 capsule 11   Vitamin D, Ergocalciferol, (DRISDOL) 1.25 MG (50000 UNIT) CAPS capsule TAKE 1 CAPSULE (50,000 UNITS TOTAL) BY MOUTH EVERY 7 (SEVEN) DAYS (Patient not taking: Reported on 04/08/2023) 4 capsule 1   No current facility-administered medications on file prior to visit.    BP 118/72   Pulse 64   Temp 98 F (36.7 C)   Resp 18   Ht 6\' 2"  (1.88 m)   Wt 275 lb (124.7 kg)   SpO2 97%   BMI 35.31 kg/m        Objective:   Physical Exam  General Mental Status- Alert.  General Appearance- Not in acute distress.   Skin General: Color- Normal Color. Moisture- Normal Moisture.   Chest and Lung Exam Auscultation: Breath Sounds:-Normal.  Cardiovascular Auscultation:Rythm- Regular. Murmurs & Other Heart Sounds:Auscultation of the heart reveals- No Murmurs.  Abdomen Inspection:-Inspeection Normal. Palpation/Percussion:Note:No mass. Palpation and Percussion of the abdomen reveal- Non Tender, Non Distended + BS, no rebound or guarding.  Neurologic Cranial Nerve exam:- CN III-XII intact(No nystagmus), symmetric smile. Strength:- 5/5 equal and symmetric strength both upper and lower extremities.       Assessment & Plan:   Patient Instructions  Anxiety Buspar 15 mg three times daily. Hopefully anxiety and mood will improve after surgery. If mood worsens me now.   Reactive depression Effexor 150 mg daily. Continue current dose.  Can refer to behavioral health/psychiatry if needed. Continue with current therapist presently.  Prostate cancer [facility]) Upcoming surgery next week.    Substance abuse (HCC)  Try to abstain for all substance despite all you challenges.   Follow up 6 weeks or sooner if needed     [employer], PA-C

## 2023-04-20 NOTE — Patient Instructions (Addendum)
Anxiety Buspar 15 mg three times daily. Hopefully anxiety and mood will improve after surgery. If mood worsens me now.   Reactive depression Effexor 150 mg daily. Continue current dose.  Can refer to behavioral health/psychiatry if needed. Continue with current therapist presently.  Prostate cancer [facility]) Upcoming surgery next week.    Substance abuse (HCC)  Try to abstain for all substance despite all you challenges.   Follow up 6 weeks or sooner if needed

## 2023-04-22 ENCOUNTER — Other Ambulatory Visit: Payer: Self-pay | Admitting: Medical

## 2023-04-27 NOTE — H&P (Signed)
Office Visit Report     04/19/2023   --------------------------------------------------------------------------------   Walter Nichols  MRN: [id number]  DOB: November 22, 1970, 52 year old Male  SSN:    PRIMARY CARE:  Esperanza Richters, [place]  PRIMARY CARE FAX:  [phone]  REFERRING:  Esperanza Richters, PA  PROVIDER:  Modena Slater, Radene Knee, M.D.  TREATING:  Anne Fu, NP  LOCATION:  [facility] [phone]     --------------------------------------------------------------------------------   CC/HPI: 04/19/2023: Patient presents today for preoperative appointment prior to undergoing bilateral nerve sparing robot-assisted laparoscopic radical prostatectomy and bilateral pelvic lymphadenectomy with Dr. Laverle Patter on 8/15. The patient denies any changes in past medical history, new prescription medications taken on daily basis, no interval surgical or procedural intervention. He denies any changes in baseline lower urinary tract symptoms including absence of any dysuria or gross hematuria. He denies any recent chest pain, shortness of breath, lightheadedness or dizziness. He denies any recent fevers or chills, nausea/vomiting. Patient has already been seen by pelvic floor PT and has been doing exercises at home in anticipation of his postoperative recovery.    CC: Prostate Cancer   Physician requesting consult: Dr. Verdia Kuba  PCP: Esperanza Richters, PA-C   [name] is a 52 year old gentleman who was found to have an elevated PSA of 9.03 at age 52 prompting urologic referral. His prostate was not able to be palpated due to his body habitus (273 lbs). He underwent an MRI of the prostate on 12/20/22 that indicated a PI-RADS 3 lesion of the bilateral mid gland. An MR/US fusion TRUS biopsy of the prostate by Dr. Alvester Morin on 01/26/23 confirmed Gleason 4+3=7 adenocarcinoma of the prostate with 10 out of 12 systematic biopsies positive and all 3 targeted biopsies positive for a total of 13 out of 15 biopsies  positive.   Family history: Uncle.   Imaging studies: PSMA PET scan (02/25/2023) -no evidence of metastatic disease. Uptake in the prostate as expected. MRI (12/20/2022)-no extraprostatic extension, seminal vesicle involvement, lymphadenopathy, or bone lesions.   PMH: He has a history of obesity, gout, sleep apnea, hypertension, hyperlipidemia, GERD, and depression. He does have a difficult family situation with a 68 year old son who is special needs and a 87 year old daughter that has dealt with mental illness. He is also now caring for his parents who are living with him.  PSH: No abdominal surgeries.   TNM stage: cT1c N0 M0  PSA: 9.03  Gleason score: 4+3=7 (GG 3)  Biopsy (01/26/23): 13/15 cores positive  Left: L lateral mid (40%, 4+3=7, PNI), L mid (70%, 4+3=7), L lateral base (30%, 3+4=7, PNI), L base (60%, 3+4=7)  Right: R apex (40%, 4+3=7), R lateral apex (30%, 4+3=7), R mid (90%, 4+3=7), R lateral mid (20%, 3+4=7), R base (90%, 4+3=7, PNI), R lateral base (60%, 4+3=7)  Prostate volume: 31.9 cc   Nomogram  OC disease: 31%  EPE: 66%  SVI: 20%  LNI: 22%  PFS (5 year, 10 year): 49%, 33%   Urinary function: IPSS is 7.  Erectile function: SHIM score is 17. He estimates that he can get an adequate erection to the completion of intercourse approximately 60 to 70% of the time. He had previously tried sildenafil in the past with good results.     ALLERGIES: No Allergies    MEDICATIONS: Amlodipine Besylate 10 mg tablet  Atorvastatin Calcium 10 mg tablet  Famotidine 20 mg tablet  Fenofibrate 48 mg tablet  Losartan Potassium 25 mg tablet  Trazodone Hcl 50  mg tablet  Venlafaxine Hcl Er 150 mg capsule, ext release 24 hr     GU PSH: Prostate Needle Biopsy - 01/26/2023     NON-GU PSH: Surgical Pathology, Gross And Microscopic Examination For Prostate Needle - 01/26/2023     GU PMH: Stress Incontinence - 03/31/2023 Prostate Cancer - 03/21/2023, - 03/15/2023, - 02/03/2023 Elevated PSA -  01/26/2023, - 11/15/2022 Encounter for Prostate Cancer screening - 11/15/2022      PMH Notes: stomach ulcers   NON-GU PMH: Muscle weakness (generalized) - 03/31/2023, - 03/21/2023 Other muscle spasm - 03/31/2023 Anxiety Depression Gout Hypercholesterolemia Hypertension Sleep Apnea    FAMILY HISTORY: 1 - Daughter, Son Prostate Cancer - Uncle   SOCIAL HISTORY: Marital Status: Single Preferred Language: English; Ethnicity: Not Hispanic Or Latino Current Smoking Status: Patient does not smoke anymore. Smoked for 20 years. Smoked 1/2 pack per day.   Tobacco Use Assessment Completed: Used Tobacco in last 30 days? Does not use smokeless tobacco. Has never drank.  Does not drink caffeine. Patient's occupation Oceanographer.    REVIEW OF SYSTEMS:    GU Review Male:   Patient reports get up at night to urinate. Patient denies frequent urination, hard to postpone urination, burning/ pain with urination, leakage of urine, stream starts and stops, trouble starting your stream, have to strain to urinate , erection problems, and penile pain.  Gastrointestinal (Upper):   Patient denies nausea, vomiting, and indigestion/ heartburn.  Gastrointestinal (Lower):   Patient denies diarrhea and constipation.  Constitutional:   Patient denies fever, night sweats, weight loss, and fatigue.  Skin:   Patient denies skin rash/ lesion and itching.  Eyes:   Patient denies blurred vision and double vision.  Ears/ Nose/ Throat:   Patient reports sinus problems. Patient denies sore throat.  Hematologic/Lymphatic:   Patient denies swollen glands and easy bruising.  Cardiovascular:   Patient denies leg swelling and chest pains.  Respiratory:   Patient denies shortness of breath and cough.  Endocrine:   Patient denies excessive thirst.  Musculoskeletal:   Patient denies back pain and joint pain.  Neurological:   Patient denies headaches and dizziness.  Psychologic:   Patient denies depression and anxiety.    Notes: Updated from previous visit 11/15/2022 with review from patient as noted above.   VITAL SIGNS:      04/19/2023 10:11 AM  BP 131/84 mmHg  Pulse 54 /min  Temperature 97.5 F / 36.3 C   MULTI-SYSTEM PHYSICAL EXAMINATION:    Constitutional: Well-nourished. No physical deformities. Normally developed. Good grooming.  Neck: Neck symmetrical, not swollen. Normal tracheal position.  Respiratory: No labored breathing, no use of accessory muscles. Clear bilaterally.  Cardiovascular: Normal temperature, normal extremity pulses, no swelling, no varicosities. Regular rate and rhythm.  Skin: No paleness, no jaundice, no cyanosis. No lesion, no ulcer, no rash.  Neurologic / Psychiatric: Oriented to time, oriented to place, oriented to person. No depression, no anxiety, no agitation.  Gastrointestinal: No mass, no tenderness, no rigidity, non obese abdomen.   Musculoskeletal: Normal gait and station of head and neck.     Complexity of Data:  Source Of History:  Patient, Medical Record Summary  Lab Test Review:   PSA  Records Review:   Pathology Reports, Previous Doctor Records, Previous Hospital Records, Previous Patient Records  Urine Test Review:   Urinalysis  X-Ray Review: PET- PSMA Scan: Reviewed Report.     04/19/23  Urinalysis  Urine Appearance Clear   Urine Color Yellow  Urine Glucose Neg mg/dL  Urine Bilirubin Neg mg/dL  Urine Ketones Neg mg/dL  Urine Specific Gravity 1.025   Urine Blood Neg ery/uL  Urine pH <=5.0   Urine Protein Neg mg/dL  Urine Urobilinogen 0.2 mg/dL  Urine Nitrites Neg   Urine Leukocyte Esterase Neg leu/uL   PROCEDURES:          Urinalysis Dipstick Dipstick Cont'd  Color: Yellow Bilirubin: Neg mg/dL  Appearance: Clear Ketones: Neg mg/dL  Specific Gravity: 1.610 Blood: Neg ery/uL  pH: <=5.0 Protein: Neg mg/dL  Glucose: Neg mg/dL Urobilinogen: 0.2 mg/dL    Nitrites: Neg    Leukocyte Esterase: Neg leu/uL    ASSESSMENT:      ICD-10 Details  1  GU:   Prostate Cancer - C61 Chronic, Threat to Bodily Function  2 NON-GU:   Encounter for other preprocedural examination - Z01.818 Undiagnosed New Problem   PLAN:           Orders Labs Urine Culture          Schedule Return Visit/Planned Activity: Keep Scheduled Appointment - Follow up MD, Schedule Surgery          Document Letter(s):  Created for Patient: Clinical Summary         Notes:   All questions answered to the best my ability regarding the upcoming procedure and expected postoperative course with understanding expressed by the patient. Precautionary urine culture sent today to serve as a baseline. Moving forward he will proceed with previously scheduled robotic prostatectomy with Dr. Laverle Patter on 8/15.        Next Appointment:      Next Appointment: 04/28/2023 11:15 AM    Appointment Type: Surgery     Location: [facility] [phone]    Provider: Heloise Purpura, M.D.    Reason for Visit: WL/OBS RA LAP RAD PROSTATECTOMY LEV 2 AND BPLND WITH AMANDA      * Signed by Anne Fu, NP on 04/19/23 at 10:36 AM (EDT*

## 2023-04-28 ENCOUNTER — Ambulatory Visit (HOSPITAL_COMMUNITY): Payer: 59 | Admitting: Anesthesiology

## 2023-04-28 ENCOUNTER — Other Ambulatory Visit: Payer: Self-pay

## 2023-04-28 ENCOUNTER — Encounter (HOSPITAL_COMMUNITY): Payer: Self-pay | Admitting: Urology

## 2023-04-28 ENCOUNTER — Observation Stay (HOSPITAL_COMMUNITY)
Admission: RE | Admit: 2023-04-28 | Discharge: 2023-04-29 | Disposition: A | Discharge status: Home / Self Care | Payer: 59 | Attending: Urology | Admitting: Urology

## 2023-04-28 ENCOUNTER — Encounter (HOSPITAL_COMMUNITY)
Admission: RE | Disposition: A | Discharge status: Home / Self Care | Payer: Self-pay | Source: Home / Self Care | Attending: Urology

## 2023-04-28 ENCOUNTER — Ambulatory Visit (HOSPITAL_BASED_OUTPATIENT_CLINIC_OR_DEPARTMENT_OTHER): Payer: 59 | Admitting: Anesthesiology

## 2023-04-28 DIAGNOSIS — Z79899 Other long term (current) drug therapy: Secondary | ICD-10-CM | POA: Diagnosis not present

## 2023-04-28 DIAGNOSIS — Z87891 Personal history of nicotine dependence: Secondary | ICD-10-CM | POA: Diagnosis not present

## 2023-04-28 DIAGNOSIS — C61 Malignant neoplasm of prostate: Secondary | ICD-10-CM | POA: Diagnosis present

## 2023-04-28 DIAGNOSIS — I10 Essential (primary) hypertension: Secondary | ICD-10-CM | POA: Insufficient documentation

## 2023-04-28 HISTORY — PX: ROBOT ASSISTED LAPAROSCOPIC RADICAL PROSTATECTOMY: SHX5141

## 2023-04-28 HISTORY — PX: LYMPHADENECTOMY: SHX5960

## 2023-04-28 LAB — ABO/RH: ABO/RH(D): A POS

## 2023-04-28 LAB — HEMOGLOBIN AND HEMATOCRIT, BLOOD
HCT: 39.6 % (ref 39.0–52.0)
Hemoglobin: 12.7 g/dL — ABNORMAL LOW (ref 13.0–17.0)

## 2023-04-28 SURGERY — XI ROBOTIC ASSISTED LAPAROSCOPIC RADICAL PROSTATECTOMY LEVEL 2
Anesthesia: General | Site: Abdomen

## 2023-04-28 MED ORDER — LIDOCAINE 2% (20 MG/ML) 5 ML SYRINGE
INTRAMUSCULAR | Status: DC | PRN
  Administered 2023-04-28: 100 mg via INTRAVENOUS

## 2023-04-28 MED ORDER — BUSPIRONE HCL 5 MG PO TABS: 15.0000 mg | ORAL_TABLET | Freq: Three times a day (TID) | ORAL | Status: DC | PRN

## 2023-04-28 MED ORDER — ONDANSETRON HCL 4 MG/2ML IJ SOLN
INTRAMUSCULAR | Status: AC
  Filled 2023-04-28: qty 2

## 2023-04-28 MED ORDER — SULFAMETHOXAZOLE-TRIMETHOPRIM 800-160 MG PO TABS: 1.0000 | ORAL_TABLET | Freq: Two times a day (BID) | ORAL | 0 refills | Status: DC

## 2023-04-28 MED ORDER — ROCURONIUM BROMIDE 10 MG/ML (PF) SYRINGE
PREFILLED_SYRINGE | INTRAVENOUS | Status: DC | PRN
  Administered 2023-04-28: 10 mg via INTRAVENOUS
  Administered 2023-04-28: 20 mg via INTRAVENOUS
  Administered 2023-04-28: 90 mg via INTRAVENOUS

## 2023-04-28 MED ORDER — HYDROMORPHONE HCL 1 MG/ML IJ SOLN
INTRAMUSCULAR | Status: AC
  Administered 2023-04-28: 0.25 mg via INTRAVENOUS
  Filled 2023-04-28: qty 1

## 2023-04-28 MED ORDER — BUPIVACAINE-EPINEPHRINE 0.25% -1:200000 IJ SOLN
INTRAMUSCULAR | Status: DC | PRN
  Administered 2023-04-28: 28 mL

## 2023-04-28 MED ORDER — MIDAZOLAM HCL 2 MG/2ML IJ SOLN
INTRAMUSCULAR | Status: AC
  Filled 2023-04-28: qty 2

## 2023-04-28 MED ORDER — ROCURONIUM BROMIDE 10 MG/ML (PF) SYRINGE
PREFILLED_SYRINGE | INTRAVENOUS | Status: AC
  Filled 2023-04-28: qty 10

## 2023-04-28 MED ORDER — DIPHENHYDRAMINE HCL 50 MG/ML IJ SOLN: 12.5000 mg | Freq: Four times a day (QID) | INTRAMUSCULAR | Status: DC | PRN

## 2023-04-28 MED ORDER — DEXAMETHASONE SODIUM PHOSPHATE 10 MG/ML IJ SOLN
INTRAMUSCULAR | Status: DC | PRN
  Administered 2023-04-28: 10 mg via INTRAVENOUS

## 2023-04-28 MED ORDER — BUPIVACAINE-EPINEPHRINE 0.25% -1:200000 IJ SOLN
INTRAMUSCULAR | Status: AC
  Filled 2023-04-28: qty 1

## 2023-04-28 MED ORDER — KCL IN DEXTROSE-NACL 20-5-0.45 MEQ/L-%-% IV SOLN
INTRAVENOUS | Status: DC
  Filled 2023-04-28 (×5): qty 1000

## 2023-04-28 MED ORDER — STERILE WATER FOR IRRIGATION IR SOLN
Status: DC | PRN
  Administered 2023-04-28: 1000 mL

## 2023-04-28 MED ORDER — DOCUSATE SODIUM 100 MG PO CAPS
100.0000 mg | ORAL_CAPSULE | Freq: Two times a day (BID) | ORAL | Status: DC
  Administered 2023-04-28 – 2023-04-29 (×2): 100 mg via ORAL
  Filled 2023-04-28 (×2): qty 1

## 2023-04-28 MED ORDER — OXYCODONE HCL 5 MG/5ML PO SOLN: 5.0000 mg | Freq: Once | ORAL | Status: AC | PRN

## 2023-04-28 MED ORDER — CHLORHEXIDINE GLUCONATE 0.12 % MT SOLN
15.0000 mL | Freq: Once | OROMUCOSAL | Status: AC
  Administered 2023-04-28: 15 mL via OROMUCOSAL

## 2023-04-28 MED ORDER — VENLAFAXINE HCL ER 150 MG PO CP24
150.0000 mg | ORAL_CAPSULE | Freq: Every day | ORAL | Status: DC
  Administered 2023-04-29: 150 mg via ORAL
  Filled 2023-04-28: qty 1

## 2023-04-28 MED ORDER — ONDANSETRON HCL 4 MG/2ML IJ SOLN
INTRAMUSCULAR | Status: DC | PRN
Start: 2023-04-28 — End: 2023-04-28
  Administered 2023-04-28: 4 mg via INTRAVENOUS

## 2023-04-28 MED ORDER — FENTANYL CITRATE (PF) 250 MCG/5ML IJ SOLN
INTRAMUSCULAR | Status: AC
  Filled 2023-04-28: qty 5

## 2023-04-28 MED ORDER — OXYCODONE HCL 5 MG PO TABS
5.0000 mg | ORAL_TABLET | Freq: Once | ORAL | Status: AC | PRN
  Administered 2023-04-28: 5 mg via ORAL

## 2023-04-28 MED ORDER — TRAMADOL HCL 50 MG PO TABS: 50.0000 mg | ORAL_TABLET | Freq: Four times a day (QID) | ORAL | 0 refills | Status: DC | PRN

## 2023-04-28 MED ORDER — TRIPLE ANTIBIOTIC 3.5-400-5000 EX OINT: 1.0000 | TOPICAL_OINTMENT | Freq: Three times a day (TID) | CUTANEOUS | Status: DC | PRN

## 2023-04-28 MED ORDER — DIPHENHYDRAMINE HCL 12.5 MG/5ML PO ELIX: 12.5000 mg | ORAL_SOLUTION | Freq: Four times a day (QID) | ORAL | Status: DC | PRN

## 2023-04-28 MED ORDER — LACTATED RINGERS IV SOLN: INTRAVENOUS | Status: DC

## 2023-04-28 MED ORDER — HYDROMORPHONE HCL 1 MG/ML IJ SOLN
0.2500 mg | INTRAMUSCULAR | Status: DC | PRN
  Administered 2023-04-28 (×3): 0.25 mg via INTRAVENOUS

## 2023-04-28 MED ORDER — HYDROMORPHONE HCL 2 MG/ML IJ SOLN
INTRAMUSCULAR | Status: AC
  Filled 2023-04-28: qty 1

## 2023-04-28 MED ORDER — ATORVASTATIN CALCIUM 10 MG PO TABS
10.0000 mg | ORAL_TABLET | Freq: Every day | ORAL | Status: DC
  Administered 2023-04-29: 10 mg via ORAL
  Filled 2023-04-28: qty 1

## 2023-04-28 MED ORDER — ONDANSETRON HCL 4 MG/2ML IJ SOLN: 4.0000 mg | INTRAMUSCULAR | Status: DC | PRN

## 2023-04-28 MED ORDER — SODIUM CHLORIDE 0.9 % IR SOLN
Status: DC | PRN
  Administered 2023-04-28: 1000 mL via INTRAVESICAL

## 2023-04-28 MED ORDER — SODIUM CHLORIDE (PF) 0.9 % IJ SOLN
INTRAMUSCULAR | Status: AC
  Filled 2023-04-28: qty 10

## 2023-04-28 MED ORDER — CEFAZOLIN SODIUM-DEXTROSE 1-4 GM/50ML-% IV SOLN
1.0000 g | Freq: Three times a day (TID) | INTRAVENOUS | Status: AC
  Administered 2023-04-28 – 2023-04-29 (×2): 1 g via INTRAVENOUS
  Filled 2023-04-28 (×2): qty 50

## 2023-04-28 MED ORDER — LACTATED RINGERS IV SOLN: INTRAVENOUS | Status: DC | PRN

## 2023-04-28 MED ORDER — FENOFIBRATE 54 MG PO TABS
54.0000 mg | ORAL_TABLET | Freq: Every day | ORAL | Status: DC
  Administered 2023-04-29: 54 mg via ORAL
  Filled 2023-04-28: qty 1

## 2023-04-28 MED ORDER — PROPOFOL 10 MG/ML IV BOLUS
INTRAVENOUS | Status: AC
  Filled 2023-04-28: qty 20

## 2023-04-28 MED ORDER — KETOROLAC TROMETHAMINE 15 MG/ML IJ SOLN
15.0000 mg | Freq: Four times a day (QID) | INTRAMUSCULAR | Status: DC
  Administered 2023-04-28 – 2023-04-29 (×4): 15 mg via INTRAVENOUS
  Filled 2023-04-28 (×4): qty 1

## 2023-04-28 MED ORDER — FENTANYL CITRATE (PF) 250 MCG/5ML IJ SOLN
INTRAMUSCULAR | Status: DC | PRN
  Administered 2023-04-28 (×2): 50 ug via INTRAVENOUS
  Administered 2023-04-28: 150 ug via INTRAVENOUS

## 2023-04-28 MED ORDER — FAMOTIDINE 20 MG PO TABS
20.0000 mg | ORAL_TABLET | Freq: Every day | ORAL | Status: DC
  Administered 2023-04-29: 20 mg via ORAL
  Filled 2023-04-28: qty 1

## 2023-04-28 MED ORDER — OXYCODONE HCL 5 MG PO TABS
ORAL_TABLET | ORAL | Status: AC
  Filled 2023-04-28: qty 1

## 2023-04-28 MED ORDER — CEFAZOLIN IN SODIUM CHLORIDE 3-0.9 GM/100ML-% IV SOLN
INTRAVENOUS | Status: AC
  Filled 2023-04-28: qty 100

## 2023-04-28 MED ORDER — LACTATED RINGERS IV SOLN
INTRAVENOUS | Status: DC | PRN
  Administered 2023-04-28: 1000 mL

## 2023-04-28 MED ORDER — MIDAZOLAM HCL 2 MG/2ML IJ SOLN
INTRAMUSCULAR | Status: DC | PRN
  Administered 2023-04-28: 2 mg via INTRAVENOUS

## 2023-04-28 MED ORDER — ORAL CARE MOUTH RINSE: 15.0000 mL | Freq: Once | OROMUCOSAL | Status: AC

## 2023-04-28 MED ORDER — PROPOFOL 10 MG/ML IV BOLUS
INTRAVENOUS | Status: DC | PRN
Start: 2023-04-28 — End: 2023-04-28
  Administered 2023-04-28: 200 mg via INTRAVENOUS

## 2023-04-28 MED ORDER — HYOSCYAMINE SULFATE 0.125 MG SL SUBL: 0.1250 mg | SUBLINGUAL_TABLET | Freq: Four times a day (QID) | SUBLINGUAL | Status: DC | PRN

## 2023-04-28 MED ORDER — ZOLPIDEM TARTRATE 5 MG PO TABS
5.0000 mg | ORAL_TABLET | Freq: Every evening | ORAL | Status: DC | PRN
  Administered 2023-04-28: 5 mg via ORAL
  Filled 2023-04-28: qty 1

## 2023-04-28 MED ORDER — HEPARIN SODIUM (PORCINE) 1000 UNIT/ML IJ SOLN
INTRAMUSCULAR | Status: AC
  Filled 2023-04-28: qty 1

## 2023-04-28 MED ORDER — DEXAMETHASONE SODIUM PHOSPHATE 10 MG/ML IJ SOLN
INTRAMUSCULAR | Status: AC
  Filled 2023-04-28: qty 1

## 2023-04-28 MED ORDER — EPHEDRINE SULFATE-NACL 50-0.9 MG/10ML-% IV SOSY
PREFILLED_SYRINGE | INTRAVENOUS | Status: DC | PRN
  Administered 2023-04-28 (×2): 5 mg via INTRAVENOUS

## 2023-04-28 MED ORDER — ONDANSETRON HCL 4 MG/2ML IJ SOLN: 4.0000 mg | Freq: Once | INTRAMUSCULAR | Status: DC | PRN

## 2023-04-28 MED ORDER — DEXMEDETOMIDINE HCL IN NACL 80 MCG/20ML IV SOLN
INTRAVENOUS | Status: AC
  Filled 2023-04-28: qty 20

## 2023-04-28 MED ORDER — CEFAZOLIN IN SODIUM CHLORIDE 3-0.9 GM/100ML-% IV SOLN
3.0000 g | INTRAVENOUS | Status: AC
  Administered 2023-04-28: 3 g via INTRAVENOUS
  Filled 2023-04-28: qty 100

## 2023-04-28 MED ORDER — MORPHINE SULFATE (PF) 2 MG/ML IV SOLN
2.0000 mg | INTRAVENOUS | Status: DC | PRN
  Administered 2023-04-28 – 2023-04-29 (×3): 2 mg via INTRAVENOUS
  Filled 2023-04-28 (×3): qty 1

## 2023-04-28 MED ORDER — NICOTINE 21 MG/24HR TD PT24
21.0000 mg | MEDICATED_PATCH | Freq: Every day | TRANSDERMAL | Status: DC
  Filled 2023-04-28: qty 1

## 2023-04-28 MED ORDER — LOSARTAN POTASSIUM 25 MG PO TABS
25.0000 mg | ORAL_TABLET | Freq: Every day | ORAL | Status: DC
  Administered 2023-04-28 – 2023-04-29 (×2): 25 mg via ORAL
  Filled 2023-04-28 (×2): qty 1

## 2023-04-28 MED ORDER — SODIUM CHLORIDE 0.9 % IV BOLUS: 1000.0000 mL | Freq: Once | INTRAVENOUS | Status: DC

## 2023-04-28 MED ORDER — HYDROMORPHONE HCL 1 MG/ML IJ SOLN
INTRAMUSCULAR | Status: DC | PRN
  Administered 2023-04-28 (×4): .5 mg via INTRAVENOUS

## 2023-04-28 MED ORDER — DEXMEDETOMIDINE HCL IN NACL 80 MCG/20ML IV SOLN
INTRAVENOUS | Status: DC | PRN
  Administered 2023-04-28 (×2): 12 ug via INTRAVENOUS
  Administered 2023-04-28: 8 ug via INTRAVENOUS

## 2023-04-28 MED ORDER — AMLODIPINE BESYLATE 10 MG PO TABS
10.0000 mg | ORAL_TABLET | Freq: Every day | ORAL | Status: DC
  Administered 2023-04-29: 10 mg via ORAL
  Filled 2023-04-28: qty 1

## 2023-04-28 MED ORDER — LIDOCAINE HCL (PF) 2 % IJ SOLN
INTRAMUSCULAR | Status: AC
  Filled 2023-04-28: qty 5

## 2023-04-28 MED ORDER — SUGAMMADEX SODIUM 200 MG/2ML IV SOLN
INTRAVENOUS | Status: DC | PRN
  Administered 2023-04-28: 300 mg via INTRAVENOUS

## 2023-04-28 MED ORDER — DROPERIDOL 2.5 MG/ML IJ SOLN: 0.6250 mg | Freq: Once | INTRAMUSCULAR | Status: DC | PRN

## 2023-04-28 MED ORDER — ACETAMINOPHEN 325 MG PO TABS
650.0000 mg | ORAL_TABLET | ORAL | Status: DC | PRN
  Administered 2023-04-29: 650 mg via ORAL
  Filled 2023-04-28: qty 2

## 2023-04-28 SURGICAL SUPPLY — 65 items
ADH SKN CLS APL DERMABOND .7 (GAUZE/BANDAGES/DRESSINGS) ×2
APL PRP STRL LF DISP 70% ISPRP (MISCELLANEOUS) ×2
APL SWBSTK 6 STRL LF DISP (MISCELLANEOUS) ×2
APPLICATOR COTTON TIP 6 STRL (MISCELLANEOUS) ×2 IMPLANT
APPLICATOR COTTON TIP 6IN STRL (MISCELLANEOUS) ×2 IMPLANT
BAG COUNTER SPONGE SURGICOUNT (BAG) IMPLANT
BAG SPNG CNTER NS LX DISP (BAG)
CATH FOLEY 2WAY SLVR 18FR 30CC (CATHETERS) ×2 IMPLANT
CATH ROBINSON RED A/P 16FR (CATHETERS) ×2 IMPLANT
CATH ROBINSON RED A/P 8FR (CATHETERS) ×2 IMPLANT
CATH TIEMANN FOLEY 18FR 5CC (CATHETERS) ×2 IMPLANT
CHLORAPREP W/TINT 26 (MISCELLANEOUS) ×2 IMPLANT
CLIP LIGATING HEM O LOK PURPLE (MISCELLANEOUS) ×2 IMPLANT
COVER SURGICAL LIGHT HANDLE (MISCELLANEOUS) ×2 IMPLANT
COVER TIP SHEARS 8 DVNC (MISCELLANEOUS) ×2 IMPLANT
CUTTER ECHEON FLEX ENDO 45 340 (ENDOMECHANICALS) ×2 IMPLANT
DERMABOND ADVANCED .7 DNX12 (GAUZE/BANDAGES/DRESSINGS) ×2 IMPLANT
DRAIN CHANNEL RND F F (WOUND CARE) IMPLANT
DRAPE ARM DVNC X/XI (DISPOSABLE) ×8 IMPLANT
DRAPE COLUMN DVNC XI (DISPOSABLE) ×2 IMPLANT
DRAPE SURG IRRIG POUCH 19X23 (DRAPES) ×2 IMPLANT
DRIVER NDL LRG 8 DVNC XI (INSTRUMENTS) ×4 IMPLANT
DRIVER NDLE LRG 8 DVNC XI (INSTRUMENTS) ×4 IMPLANT
DRSG TEGADERM 4X4.75 (GAUZE/BANDAGES/DRESSINGS) ×2 IMPLANT
ELECT PENCIL ROCKER SW 15FT (MISCELLANEOUS) ×2 IMPLANT
ELECT REM PT RETURN 15FT ADLT (MISCELLANEOUS) ×2 IMPLANT
FORCEPS BPLR LNG DVNC XI (INSTRUMENTS) ×2 IMPLANT
FORCEPS PROGRASP DVNC XI (FORCEP) ×2 IMPLANT
GAUZE SPONGE 4X4 12PLY STRL (GAUZE/BANDAGES/DRESSINGS) ×2 IMPLANT
GLOVE BIO SURGEON STRL SZ 6.5 (GLOVE) ×2 IMPLANT
GLOVE SURG LX STRL 7.5 STRW (GLOVE) ×4 IMPLANT
GOWN STRL REUS W/ TWL XL LVL3 (GOWN DISPOSABLE) ×4 IMPLANT
GOWN STRL REUS W/TWL XL LVL3 (GOWN DISPOSABLE) ×4
GOWN STRL SURGICAL XL XLNG (GOWN DISPOSABLE) ×2 IMPLANT
HOLDER FOLEY CATH W/STRAP (MISCELLANEOUS) ×2 IMPLANT
IRRIG SUCT STRYKERFLOW 2 WTIP (MISCELLANEOUS) ×2
IRRIGATION SUCT STRKRFLW 2 WTP (MISCELLANEOUS) ×2 IMPLANT
IV LACTATED RINGERS 1000ML (IV SOLUTION) ×2 IMPLANT
KIT TURNOVER KIT A (KITS) IMPLANT
NDL SAFETY ECLIP 18X1.5 (MISCELLANEOUS) IMPLANT
PACK ROBOT UROLOGY CUSTOM (CUSTOM PROCEDURE TRAY) ×2 IMPLANT
PLUG CATH AND CAP STRL 200 (CATHETERS) ×2 IMPLANT
RELOAD STAPLE 45 4.1 GRN THCK (STAPLE) ×2 IMPLANT
SCISSORS MNPLR CVD DVNC XI (INSTRUMENTS) ×2 IMPLANT
SEAL UNIV 5-12 XI (MISCELLANEOUS) ×8 IMPLANT
SET CYSTO W/LG BORE CLAMP LF (SET/KITS/TRAYS/PACK) IMPLANT
SET TUBE SMOKE EVAC HIGH FLOW (TUBING) ×2 IMPLANT
SOL ELECTROSURG ANTI STICK (MISCELLANEOUS) ×2
SOL PREP POV-IOD 4OZ 10% (MISCELLANEOUS) ×2 IMPLANT
SOLUTION ELECTROSURG ANTI STCK (MISCELLANEOUS) ×2 IMPLANT
SPIKE FLUID TRANSFER (MISCELLANEOUS) ×2 IMPLANT
STAPLE RELOAD 45 GRN (STAPLE) ×2 IMPLANT
SUT ETHILON 3 0 PS 1 (SUTURE) ×2 IMPLANT
SUT MNCRL 3 0 RB1 (SUTURE) ×2 IMPLANT
SUT MNCRL 3 0 VIOLET RB1 (SUTURE) ×2 IMPLANT
SUT MNCRL AB 4-0 PS2 18 (SUTURE) ×4 IMPLANT
SUT PDS PLUS AB 0 CT-2 (SUTURE) ×4 IMPLANT
SUT VIC AB 0 CT1 27 (SUTURE) ×4
SUT VIC AB 0 CT1 27XBRD ANTBC (SUTURE) ×4 IMPLANT
SUT VIC AB 2-0 SH 27 (SUTURE) ×2
SUT VIC AB 2-0 SH 27X BRD (SUTURE) ×2 IMPLANT
SYR 27GX1/2 1ML LL SAFETY (SYRINGE) ×2 IMPLANT
TOWEL OR NON WOVEN STRL DISP B (DISPOSABLE) ×2 IMPLANT
TROCAR Z THREAD OPTICAL 12X100 (TROCAR) IMPLANT
WATER STERILE IRR 1000ML POUR (IV SOLUTION) ×2 IMPLANT

## 2023-04-28 NOTE — Interval H&P Note (Signed)
History and Physical Interval Note:  04/28/2023 9:28 AM  Walter Nichols  has presented today for surgery, with the diagnosis of PROSTATE CANCER.  The various methods of treatment have been discussed with the patient and family. After consideration of risks, benefits and other options for treatment, the patient has consented to  Procedure(s) with comments: XI ROBOTIC ASSISTED LAPAROSCOPIC RADICAL PROSTATECTOMY LEVEL 2 (N/A) - 210 MINUTES NEEDED FOR CASE BILATERAL PELVIC LYMPHADENECTOMY (Bilateral) as a surgical intervention.  The patient's history has been reviewed, patient examined, no change in status, stable for surgery.  I have reviewed the patient's chart and labs.  Questions were answered to the patient's satisfaction.     [employer]

## 2023-04-28 NOTE — Plan of Care (Signed)
°  Problem: Education: Goal: Knowledge of the procedure and recovery process will improve Outcome: Progressing   Problem: Bowel/Gastric: Goal: Gastrointestinal status for postoperative course will improve Outcome: Progressing   Problem: Pain Management: Goal: General experience of comfort will improve Outcome: Progressing   

## 2023-04-28 NOTE — Anesthesia Preprocedure Evaluation (Addendum)
Anesthesia Evaluation  Patient identified by MRN, date of birth, ID band Patient awake    Reviewed: Allergy & Precautions, NPO status , Patient's Chart, lab work & pertinent test results  Airway Mallampati: II       Dental no notable dental hx. (+) Teeth Intact   Pulmonary sleep apnea and Continuous Positive Airway Pressure Ventilation , Current Smoker and Patient abstained from smoking.   Pulmonary exam normal breath sounds clear to auscultation       Cardiovascular hypertension, Normal cardiovascular exam Rhythm:Regular Rate:Normal  EKG 04/14/23 NSR, NSCSLT   Neuro/Psych  Headaches PSYCHIATRIC DISORDERS Anxiety Depression     Neuromuscular disease    GI/Hepatic negative GI ROS, Neg liver ROS,,,  Endo/Other  Obesity   Renal/GU negative Renal ROS   Prostate Ca    Musculoskeletal negative musculoskeletal ROS (+)    Abdominal  (+) + obese  Peds  Hematology negative hematology ROS (+)   Anesthesia Other Findings   Reproductive/Obstetrics                             Anesthesia Physical Anesthesia Plan  ASA: 2  Anesthesia Plan: General   Post-op Pain Management:    Induction: Intravenous  PONV Risk Score and Plan: 3 and Treatment may vary due to age or medical condition and Ondansetron  Airway Management Planned: Oral ETT  Additional Equipment: None  Intra-op Plan:   Post-operative Plan: Extubation in OR  Informed Consent: I have reviewed the patients History and Physical, chart, labs and discussed the procedure including the risks, benefits and alternatives for the proposed anesthesia with the patient or authorized representative who has indicated his/her understanding and acceptance.     Dental advisory given  Plan Discussed with: CRNA and Anesthesiologist  Anesthesia Plan Comments:         Anesthesia Quick Evaluation

## 2023-04-28 NOTE — Op Note (Signed)
Preoperative diagnosis: Clinically localized adenocarcinoma of the prostate (clinical stage T1c)  Postoperative diagnosis: Clinically localized adenocarcinoma of the prostate (clinical stage T1c)  Procedure:  Robotic assisted laparoscopic radical prostatectomy (bilateral nerve sparing) Bilateral robotic assisted laparoscopic pelvic lymphadenectomy  Surgeon: Moody Bruins. M.D.  Assistant: Harrie Foreman, PA-C  An assistant was required for this surgical procedure.  The duties of the assistant included but were not limited to suctioning, passing suture, camera manipulation, retraction. This procedure would not be able to be performed without an Geophysicist/field seismologist.  Anesthesia: General  Complications: None  EBL: 150 mL  IVF:  2000 mL crystalloid  Specimens: Prostate and seminal vesicles Right pelvic lymph nodes Left pelvic lymph nodes  Disposition of specimens: Pathology  Drains: 20 Fr coude catheter # 19 Blake pelvic drain  Indication: Walter Nichols is a 52 y.o. year old patient with clinically localized prostate cancer.  After a thorough review of the management options for treatment of prostate cancer, he elected to proceed with surgical therapy and the above procedure(s).  We have discussed the potential benefits and risks of the procedure, side effects of the proposed treatment, the likelihood of the patient achieving the goals of the procedure, and any potential problems that might occur during the procedure or recuperation. Informed consent has been obtained.  Description of procedure:  The patient was taken to the operating room and a general anesthetic was administered. He was given preoperative antibiotics, placed in the dorsal lithotomy position, and prepped and draped in the usual sterile fashion. Next a preoperative timeout was performed. A urethral catheter was placed into the bladder and a site was selected near the umbilicus for placement of the camera port. This was  placed using a standard open Hassan technique which allowed entry into the peritoneal cavity under direct vision and without difficulty. An 8 mm robotic port was placed and a pneumoperitoneum established. The camera was then used to inspect the abdomen and there was no evidence of any intra-abdominal injuries or other abnormalities. The remaining abdominal ports were then placed. 8 mm robotic ports were placed in the right lower quadrant, left lower quadrant, and far left lateral abdominal wall. A 5 mm port was placed in the right upper quadrant and a 12 mm port was placed in the right lateral abdominal wall for laparoscopic assistance. All ports were placed under direct vision without difficulty. The surgical cart was then docked.   Utilizing the cautery scissors, the bladder was reflected posteriorly allowing entry into the space of Retzius and identification of the endopelvic fascia and prostate. The periprostatic fat was then removed from the prostate allowing full exposure of the endopelvic fascia. The endopelvic fascia was then incised from the apex back to the base of the prostate bilaterally and the underlying levator muscle fibers were swept laterally off the prostate thereby isolating the dorsal venous complex. The dorsal vein was then stapled and divided with a 45 mm Flex Echelon stapler. Attention then turned to the bladder neck which was divided anteriorly thereby allowing entry into the bladder and exposure of the urethral catheter. The catheter balloon was deflated and the catheter was brought into the operative field and used to retract the prostate anteriorly. The posterior bladder neck was then examined and was divided allowing further dissection between the bladder and prostate posteriorly until the vasa deferentia and seminal vessels were identified. The vasa deferentia were isolated, divided, and lifted anteriorly. The seminal vesicles were dissected down to their tips with care to  control the  seminal vascular arterial blood supply. These structures were then lifted anteriorly and the space between Denonvillier's fascia and the anterior rectum was developed with a combination of sharp and blunt dissection. This isolated the vascular pedicles of the prostate.  The lateral prostatic fascia was then sharply incised allowing release of the neurovascular bundles bilaterally. The vascular pedicles of the prostate were then ligated with Weck clips between the prostate and neurovascular bundles and divided with sharp cold scissor dissection resulting in neurovascular bundle preservation. The neurovascular bundles were then separated off the apex of the prostate and urethra bilaterally.  The urethra was then sharply transected allowing the prostate specimen to be disarticulated. The pelvis was copiously irrigated and hemostasis was ensured. There was no evidence for rectal injury.  Attention then turned to the right pelvic sidewall. The fibrofatty tissue between the external iliac vein, confluence of the iliac vessels, hypogastric artery, and Cooper's ligament was dissected free from the pelvic sidewall with care to preserve the obturator nerve. Weck clips were used for lymphostasis and hemostasis. An identical procedure was performed on the contralateral side and the lymphatic packets were removed for permanent pathologic analysis.  Attention then turned to the urethral anastomosis. A 2-0 Vicryl slip knot was placed between Denonvillier's fascia, the posterior bladder neck, and the posterior urethra to reapproximate these structures. A double-armed 3-0 Monocryl suture was then used to perform a 360 running tension-free anastomosis between the bladder neck and urethra. A new urethral catheter was then placed into the bladder and irrigated. There were no blood clots within the bladder and the anastomosis appeared to be watertight. A #19 Blake drain was then brought through the left lateral 8 mm port site  and positioned appropriately within the pelvis. It was secured to the skin with a nylon suture. The surgical cart was then undocked. The right lateral 12 mm port site was closed at the fascial level with a 0 Vicryl suture placed laparoscopically. All remaining ports were then removed under direct vision. The prostate specimen was removed intact within the Endopouch retrieval bag via the periumbilical camera port site. This fascial opening was closed with two running 0 PDS sutures. 0.25% Marcaine was then injected into all port sites and all incisions were reapproximated at the skin level with 4-0 Monocryl subcuticular sutures and Dermabond. The patient appeared to tolerate the procedure well and without complications. The patient was able to be extubated and transferred to the recovery unit in satisfactory condition.   Moody Bruins MD

## 2023-04-28 NOTE — Progress Notes (Signed)
Patient ID: Walter Nichols, male   DOB: Apr 16, 1971, 52 y.o.   MRN: [id number]  Post-op note  Subjective: The patient is doing well.  No complaints.  Objective: Vital signs in last 24 hours: Temp:  [97.6 F (36.4 C)-98.2 F (36.8 C)] 97.6 F (36.4 C) (08/15 1312) Pulse Rate:  [59-78] 59 (08/15 1402) Resp:  [8-22] 13 (08/15 1415) BP: (123-150)/(78-93) 129/81 (08/15 1400) SpO2:  [92 %-100 %] 92 % (08/15 1402) Weight:  [124.7 kg] 124.7 kg (08/15 0928)  Intake/Output from previous day: No intake/output data recorded. Intake/Output this shift: Total I/O In: 2300 [I.V.:2200; IV Piggyback:100] Out: 150 [Blood:150]  Physical Exam:  General: Alert and oriented. Abdomen: Soft, Nondistended. Incisions: Clean and dry. GU: Urine pink and draining.  Lab Results: Recent Labs    04/28/23 1323  HGB 12.7*  HCT 39.6    Assessment/Plan: POD#0   1) Continue to monitor, ambulate, IS   Moody Bruins. MD   LOS: 0 days   Crecencio Mc 04/28/2023, 2:49 PM

## 2023-04-28 NOTE — Anesthesia Procedure Notes (Signed)
Procedure Name: Intubation Date/Time: 04/28/2023 10:36 AM  Performed by: Florene Route, CRNAPre-anesthesia Checklist: Patient identified, Emergency Drugs available, Suction available and Patient being monitored Patient Re-evaluated:Patient Re-evaluated prior to induction Oxygen Delivery Method: Circle system utilized Preoxygenation: Pre-oxygenation with 100% oxygen Induction Type: IV induction Ventilation: Mask ventilation without difficulty and Oral airway inserted - appropriate to patient size Laryngoscope Size: Hyacinth Meeker and 3 Grade View: Grade I Tube type: Oral Tube size: 8.0 mm Number of attempts: 1 Airway Equipment and Method: Stylet and Oral airway Placement Confirmation: ETT inserted through vocal cords under direct vision, positive ETCO2 and breath sounds checked- equal and bilateral Secured at: 23 cm Tube secured with: Tape Dental Injury: Teeth and Oropharynx as per pre-operative assessment

## 2023-04-28 NOTE — Transfer of Care (Signed)
Immediate Anesthesia Transfer of Care Note  Patient: Walter Nichols  Procedure(s) Performed: XI ROBOTIC ASSISTED LAPAROSCOPIC RADICAL PROSTATECTOMY LEVEL 2 (Abdomen) BILATERAL PELVIC LYMPHADENECTOMY (Bilateral: Abdomen)  Patient Location: PACU  Anesthesia Type:General  Level of Consciousness: drowsy  Airway & Oxygen Therapy: Patient Spontanous Breathing and Patient connected to face mask oxygen  Post-op Assessment: Report given to RN and Post -op Vital signs reviewed and stable  Post vital signs: Reviewed and stable  Last Vitals:  Vitals Value Taken Time  BP 140/82 04/28/23 1315  Temp    Pulse 77 04/28/23 1317  Resp 19 04/28/23 1317  SpO2 100 % 04/28/23 1317  Vitals shown include unfiled device data.  Last Pain:  Vitals:   04/28/23 0928  TempSrc: Oral  PainSc: 0-No pain         Complications: No notable events documented.

## 2023-04-28 NOTE — Plan of Care (Signed)
  Problem: Education: Goal: Knowledge of General Education information will improve Description: Including pain rating scale, medication(s)/side effects and non-pharmacologic comfort measures Outcome: Progressing   Problem: Health Behavior/Discharge Planning: Goal: Ability to manage health-related needs will improve Outcome: Progressing   Problem: Activity: Goal: Risk for activity intolerance will decrease Outcome: Progressing   Problem: Nutrition: Goal: Adequate nutrition will be maintained Outcome: Progressing   Problem: Coping: Goal: Level of anxiety will decrease Outcome: Progressing   Problem: Elimination: Goal: Will not experience complications related to urinary retention Outcome: Progressing   Problem: Pain Managment: Goal: General experience of comfort will improve Outcome: Progressing   Problem: Safety: Goal: Ability to remain free from injury will improve Outcome: Progressing

## 2023-04-28 NOTE — Anesthesia Postprocedure Evaluation (Signed)
Anesthesia Post Note  Patient: Copy  Procedure(s) Performed: XI ROBOTIC ASSISTED LAPAROSCOPIC RADICAL PROSTATECTOMY LEVEL 2 (Abdomen) BILATERAL PELVIC LYMPHADENECTOMY (Bilateral: Abdomen)     Patient location during evaluation: PACU Anesthesia Type: General Level of consciousness: awake and alert and oriented Pain management: pain level controlled Vital Signs Assessment: post-procedure vital signs reviewed and stable Respiratory status: spontaneous breathing, nonlabored ventilation and respiratory function stable Cardiovascular status: blood pressure returned to baseline and stable Postop Assessment: no apparent nausea or vomiting Anesthetic complications: no   No notable events documented.  Last Vitals:  Vitals:   04/28/23 1400 04/28/23 1402  BP: 129/81   Pulse: 61 (!) 59  Resp: 12 19  Temp:    SpO2: 94% 92%    Last Pain:  Vitals:   04/28/23 1409  TempSrc:   PainSc: 5                  , A.

## 2023-04-28 NOTE — Discharge Instructions (Signed)

## 2023-04-29 ENCOUNTER — Encounter (HOSPITAL_COMMUNITY): Payer: Self-pay | Admitting: Urology

## 2023-04-29 DIAGNOSIS — C61 Malignant neoplasm of prostate: Secondary | ICD-10-CM | POA: Diagnosis not present

## 2023-04-29 LAB — HEMOGLOBIN AND HEMATOCRIT, BLOOD
HCT: 37.8 % — ABNORMAL LOW (ref 39.0–52.0)
Hemoglobin: 12.5 g/dL — ABNORMAL LOW (ref 13.0–17.0)

## 2023-04-29 MED ORDER — BISACODYL 10 MG RE SUPP
10.0000 mg | Freq: Once | RECTAL | Status: AC
  Administered 2023-04-29: 10 mg via RECTAL
  Filled 2023-04-29: qty 1

## 2023-04-29 MED ORDER — TRAMADOL HCL 50 MG PO TABS
50.0000 mg | ORAL_TABLET | Freq: Four times a day (QID) | ORAL | Status: DC | PRN
  Administered 2023-04-29: 100 mg via ORAL
  Filled 2023-04-29: qty 2

## 2023-04-29 NOTE — TOC Transition Note (Signed)
Transition of Care [facility]) - CM/SW Discharge Note   Patient Details  Name: Walter Nichols MRN: [id number] Date of Birth: 09-24-1970  Transition of Care [facility]) CM/SW Contact:  Howell Rucks, RN Phone Number: 04/29/2023, 2:37 PM   Clinical Narrative:  Pt to dc home with family support, family to provide transportation. No further TOC needs identified.     Final next level of care: Home/Self Care Barriers to Discharge: Barriers Resolved   Patient Goals and CMS Choice      Discharge Placement                         Discharge Plan and Services Additional resources added to the After Visit Summary for                                       Social Determinants of Health (SDOH) Interventions SDOH Screenings   Food Insecurity: Food Insecurity Present (04/28/2023)  Housing: Low Risk  (04/28/2023)  Transportation Needs: No Transportation Needs (04/28/2023)  Utilities: Not At Risk (04/28/2023)  Depression (PHQ2-9): High Risk (02/08/2023)  Tobacco Use: High Risk (04/28/2023)     Readmission Risk Interventions     No data to display

## 2023-04-29 NOTE — TOC CM/SW Note (Signed)
Transition of Care [facility]) - Inpatient Brief Assessment   Patient Details  Name: Tamarius Edelstein MRN: [id number] Date of Birth: 1971-03-04  Transition of Care [facility]) CM/SW Contact:    Howell Rucks, RN Phone Number: 04/29/2023, 11:55 AM   Clinical Narrative: Met with pt at bedside to introduce role of TOC/NCM and review for dc planning. Pt confirmed he has a PCP and pharmacy in place, reports she lives with family members and feels safe returning home, reports no current home care services or home DME. SDOH consult for food insecurity, pt confirmed he is aware and has been using local food pantries, reports he should be receiving additional finances which will improve his ability to obtain food. NCM added Radio producer to AVS. Pt reports he has family members to provide transportation at discharge. TOC Brief Assessment completed. No further TOC needs identified.     Transition of Care Asessment: Insurance and Status: Insurance coverage has been reviewed Patient has primary care physician: Yes Home environment has been reviewed: resides with family Prior level of function:: Independent Prior/Current Home Services: No current home services Social Determinants of Health Reivew: SDOH reviewed no interventions necessary Readmission risk has been reviewed: Yes Transition of care needs: no transition of care needs at this time

## 2023-04-29 NOTE — Progress Notes (Signed)
Patient ID: Walter Nichols, male   DOB: 02-Aug-1971, 52 y.o.   MRN: [id number]  1 Day Post-Op Subjective: The patient is doing well.  No nausea or vomiting. Pain is adequately controlled.  Objective: Vital signs in last 24 hours: Temp:  [97.6 F (36.4 C)-99.6 F (37.6 C)] 99 F (37.2 C) (08/16 0404) Pulse Rate:  [51-78] 59 (08/16 0404) Resp:  [8-22] 20 (08/16 0404) BP: (123-157)/(64-98) 151/98 (08/16 0404) SpO2:  [92 %-100 %] 100 % (08/16 0404) Weight:  [124.7 kg] 124.7 kg (08/15 0928)  Intake/Output from previous day: 08/15 0701 - 08/16 0700 In: 4191.6 [P.O.:357; I.V.:3634.6; IV Piggyback:200] Out: 3590 [Urine:3070; Drains:370; Blood:150] Intake/Output this shift: No intake/output data recorded.  Physical Exam:  General: Alert and oriented. CV: RRR Lungs: Clear bilaterally. GI: Soft, Nondistended. Incisions: Clean, dry, and intact Urine: Clear Extremities: Nontender, no erythema, no edema.  Lab Results: Recent Labs    04/28/23 1323 04/29/23 0403  HGB 12.7* 12.5*  HCT 39.6 37.8*      Assessment/Plan: POD# 1 s/p robotic prostatectomy.  1) SL IVF 2) Ambulate, Incentive spirometry 3) Transition to oral pain medication 4) Dulcolax suppository 5) D/C pelvic drain 6) Plan for likely discharge later today   Walter Nichols. MD   LOS: 0 days   Walter Nichols 04/29/2023, 7:36 AM

## 2023-04-29 NOTE — Discharge Summary (Signed)
Date of admission: 04/28/2023  Date of discharge: 04/29/2023  Admission diagnosis: Prostate Cancer  Discharge diagnosis: Prostate Cancer  History and Physical: For full details, please see admission history and physical. Briefly, Walter Nichols is a 52 y.o. gentleman with localized prostate cancer.  After discussing management/treatment options, he elected to proceed with surgical treatment.  Hospital Course: Walter Nichols was taken to the operating room on 04/28/2023 and underwent a robotic assisted laparoscopic radical prostatectomy. He tolerated this procedure well and without complications. Postoperatively, he was able to be transferred to a regular hospital room following recovery from anesthesia.  He was able to begin ambulating the night of surgery. He remained hemodynamically stable overnight.  He had excellent urine output with appropriately minimal output from his pelvic drain and his pelvic drain was removed on POD #1.  He was transitioned to oral pain medication, tolerated a clear liquid diet, and had met all discharge criteria and was able to be discharged home later on POD#1.  Laboratory values:  Recent Labs    04/28/23 1323 04/29/23 0403  HGB 12.7* 12.5*  HCT 39.6 37.8*    Disposition: Home  Discharge instruction: He was instructed to be ambulatory but to refrain from heavy lifting, strenuous activity, or driving. He was instructed on urethral catheter care.  Discharge medications:   Allergies as of 04/29/2023   No Known Allergies      Medication List     STOP taking these medications    meloxicam 15 MG tablet Commonly known as: MOBIC   Vitamin D (Ergocalciferol) 1.25 MG (50000 UNIT) Caps capsule Commonly known as: DRISDOL       TAKE these medications    AMBULATORY NON FORMULARY MEDICATION Nitroglycerin ointment 0.125% apply BID rectally for 6-8 weeks   amLODipine 10 MG tablet Commonly known as: NORVASC TAKE 1 TABLET BY MOUTH EVERY DAY   atorvastatin 10  MG tablet Commonly known as: LIPITOR TAKE 1 TABLET BY MOUTH EVERY DAY   atorvastatin 10 MG tablet Commonly known as: LIPITOR TAKE 1 TABLET BY MOUTH EVERY DAY   busPIRone 15 MG tablet Commonly known as: BUSPAR TAKE 1 TABLET BY MOUTH 3 TIMES DAILY. What changed:  when to take this reasons to take this   cyclobenzaprine 5 MG tablet Commonly known as: FLEXERIL 1 tab po q hs prn tension ha   docusate sodium 100 MG capsule Commonly known as: COLACE Take 100 mg by mouth daily.   famotidine 20 MG tablet Commonly known as: PEPCID TAKE 1 TABLET BY MOUTH EVERY DAY   fenofibrate 48 MG tablet Commonly known as: TRICOR TAKE 1 TABLET BY MOUTH EVERY DAY   losartan 25 MG tablet Commonly known as: COZAAR TAKE 1 TABLET (25 MG TOTAL) BY MOUTH DAILY.   nicotine 21 mg/24hr patch Commonly known as: NICODERM CQ - dosed in mg/24 hours Place 1 patch (21 mg total) onto the skin daily.   sildenafil 50 MG tablet Commonly known as: Viagra 1 tab po as needed one hour prior to sex   sulfamethoxazole-trimethoprim 800-160 MG tablet Commonly known as: BACTRIM DS Take 1 tablet by mouth 2 (two) times daily. Start the day prior to foley removal appointment   traMADol 50 MG tablet Commonly known as: Ultram Take 1-2 tablets (50-100 mg total) by mouth every 6 (six) hours as needed for moderate pain or severe pain.   traZODone 50 MG tablet Commonly known as: DESYREL TAKE 1/2 TAB AT NIGHT   traZODone 50 MG tablet Commonly known as: DESYREL TAKE  1/2 TO 1 TABLET BY MOUTH AT BEDTIME AS NEEDED FOR SLEEP   traZODone 50 MG tablet Commonly known as: DESYREL Take 0.5-1 tablets (25-50 mg total) by mouth at bedtime as needed for sleep.   triamcinolone 55 MCG/ACT Aero nasal inhaler Commonly known as: NASACORT Place 1 spray into the nose daily as needed (allergies).   venlafaxine XR 150 MG 24 hr capsule Commonly known as: Effexor XR Take 1 capsule (150 mg total) by mouth daily with breakfast.         Followup: He will followup in 1 week for catheter removal and to discuss his surgical pathology results.

## 2023-04-29 NOTE — Progress Notes (Signed)
Patient educated in regards to indwelling catheter foley care, changing foley bag from large to small. Patient verbalized understanding. Patient also verbalized handwashing needed to be performed before and after caring for foley cath. Patient was educated on discharge instructions as well by Urology Harrie Foreman PA and this RN. Patient was given dressing supplies for previous right JP site as well as foley catheter supplies. Patient stable and discharged. PIVs removed from left and right arms.

## 2023-05-02 ENCOUNTER — Telehealth: Payer: Self-pay

## 2023-05-02 NOTE — Transitions of Care (Post Inpatient/ED Visit) (Signed)
05/02/2023  Name: Walter Nichols MRN: [id number] DOB: [birth year]  Today's TOC FU Call Status: Today's TOC FU Call Status:: Successful TOC FU Call Completed TOC FU Call Complete Date: 05/02/23  Transition Care Management Follow-up Telephone Call Date of Discharge: 04/29/23 Discharge Facility: [facility]) Type of Discharge: Inpatient Admission Primary Inpatient Discharge Diagnosis:: hypertension How have you been since you were released from the hospital?: Better Any questions or concerns?: No  Items Reviewed: Did you receive and understand the discharge instructions provided?: Yes Medications obtained,verified, and reconciled?: Yes (Medications Reviewed) Any new allergies since your discharge?: No Dietary orders reviewed?: Yes Do you have support at home?: Yes People in Home: spouse  Medications Reviewed Today: Medications Reviewed Today     Reviewed by Karena Addison, LPN (Licensed Practical Nurse) on 05/02/23 at 1542  Med List Status: <None>   Medication Order Taking? Sig Documenting Provider Last Dose Status Informant  AMBULATORY NON FORMULARY MEDICATION [id number] No Nitroglycerin ointment 0.125% apply BID rectally for 6-8 weeks Cirigliano, Vito V, DO Taking Active Self  amLODipine (NORVASC) 10 MG tablet [id number] No TAKE 1 TABLET BY MOUTH EVERY DAY Marisue Brooklyn 04/28/2023 0800 Active   atorvastatin (LIPITOR) 10 MG tablet [id number] No TAKE 1 TABLET BY MOUTH EVERY DAY  Patient not taking: Reported on 04/08/2023   Esperanza Richters, PA-C Not Taking Active Self  atorvastatin (LIPITOR) 10 MG tablet [id number] No TAKE 1 TABLET BY MOUTH EVERY DAY Marisue Brooklyn 04/28/2023 0800 Active Self  busPIRone (BUSPAR) 15 MG tablet [id number] No TAKE 1 TABLET BY MOUTH 3 TIMES DAILY.  Patient taking differently: Take 15 mg by mouth 3 (three) times daily as needed (anxiety).   Esperanza Richters, PA-C 04/28/2023 0800 Active Self  cyclobenzaprine (FLEXERIL) 5 MG tablet [id number] No 1  tab po q hs prn tension ha  Patient not taking: Reported on 04/08/2023   Donato Schultz, DO Not Taking Active Self  docusate sodium (COLACE) 100 MG capsule [id number]  Take 100 mg by mouth daily. [provider]  Active Self  famotidine (PEPCID) 20 MG tablet [id number] No TAKE 1 TABLET BY MOUTH EVERY DAY Marisue Brooklyn 04/28/2023 0800 Active Self  fenofibrate (TRICOR) 48 MG tablet [id number] No TAKE 1 TABLET BY MOUTH EVERY DAY Marisue Brooklyn 04/28/2023 0800 Active   losartan (COZAAR) 25 MG tablet [id number] No TAKE 1 TABLET (25 MG TOTAL) BY MOUTH DAILY. Esperanza Richters, PA-C 04/27/2023 Active   nicotine (NICODERM CQ - DOSED IN MG/24 HOURS) 21 mg/24hr patch [id number]  Place 1 patch (21 mg total) onto the skin daily. Saguier, [name], PA-C  Active Self           Med Note Hart Rochester, Lafe Garin   Fri Apr 08, 2023 12:55 PM) Has not picked up yet  sildenafil (VIAGRA) 50 MG tablet [id number] No 1 tab po as needed one hour prior to sex Esperanza Richters, [place] Taking Active Self  sulfamethoxazole-trimethoprim (BACTRIM DS) 800-160 MG tablet [id number]  Take 1 tablet by mouth 2 (two) times daily. Start the day prior to foley removal appointment Harrie Foreman, PA-C  Active   traMADol (ULTRAM) 50 MG tablet [id number]  Take 1-2 tablets (50-100 mg total) by mouth every 6 (six) hours as needed for moderate pain or severe pain. Harrie Foreman, PA-C  Active   traZODone (DESYREL) 50 MG tablet [id number] No TAKE 1/2 TAB AT NIGHT  Patient not taking: Reported on 04/08/2023   Esperanza Richters, PA-C Not Taking Active Self  traZODone (DESYREL) 50  MG tablet [id number] No TAKE 1/2 TO 1 TABLET BY MOUTH AT BEDTIME AS NEEDED FOR SLEEP  Patient not taking: Reported on 04/08/2023   Esperanza Richters, PA-C Not Taking Active Self  traZODone (DESYREL) 50 MG tablet [id number] No Take 0.5-1 tablets (25-50 mg total) by mouth at bedtime as needed for sleep. Esperanza Richters, Cordelia Poche 04/27/2023 Active Self  triamcinolone (NASACORT) 55  MCG/ACT AERO nasal inhaler [id number] No Place 1 spray into the nose daily as needed (allergies). [provider] Past Week Active Self  venlafaxine XR (EFFEXOR XR) 150 MG 24 hr capsule [id number] No Take 1 capsule (150 mg total) by mouth daily with breakfast. Esperanza Richters, PA-C Taking Active Self           Med Note Alphonzo Dublin   Fri Apr 08, 2023 12:56 PM) Currently needs refills            Home Care and Equipment/Supplies: Were Home Health Services Ordered?: NA Any new equipment or medical supplies ordered?: NA  Functional Questionnaire: Do you need assistance with bathing/showering or dressing?: No Do you need assistance with meal preparation?: No Do you need assistance with eating?: No Do you have difficulty maintaining continence: No Do you need assistance with getting out of bed/getting out of a chair/moving?: No Do you have difficulty managing or taking your medications?: No  Follow up appointments reviewed: PCP Follow-up appointment confirmed?: NA Specialist Hospital Follow-up appointment confirmed?: Yes Date of Specialist follow-up appointment?: 05/04/23 Follow-Up Specialty Provider:: uro Do you need transportation to your follow-up appointment?: No Do you understand care options if your condition(s) worsen?: Yes-patient verbalized understanding    SIGNATURE Karena Addison, LPN [facility] Nurse Health Advisor Direct Dial [phone]

## 2023-05-04 LAB — SURGICAL PATHOLOGY

## 2023-05-13 ENCOUNTER — Ambulatory Visit: Payer: 59 | Admitting: Medical

## 2023-05-13 ENCOUNTER — Ambulatory Visit (HOSPITAL_BASED_OUTPATIENT_CLINIC_OR_DEPARTMENT_OTHER)
Admission: RE | Admit: 2023-05-13 | Discharge: 2023-05-13 | Disposition: A | Discharge status: Home / Self Care | Payer: 59 | Source: Ambulatory Visit | Attending: Medical | Admitting: Medical

## 2023-05-13 VITALS — BP 131/81 | HR 76 | Resp 18 | Ht 74.0 in | Wt 269.0 lb

## 2023-05-13 DIAGNOSIS — N50812 Left testicular pain: Secondary | ICD-10-CM

## 2023-05-13 DIAGNOSIS — G8918 Other acute postprocedural pain: Secondary | ICD-10-CM | POA: Diagnosis not present

## 2023-05-13 DIAGNOSIS — R0789 Other chest pain: Secondary | ICD-10-CM

## 2023-05-13 DIAGNOSIS — J320 Chronic maxillary sinusitis: Secondary | ICD-10-CM | POA: Diagnosis not present

## 2023-05-13 DIAGNOSIS — R0781 Pleurodynia: Secondary | ICD-10-CM | POA: Diagnosis present

## 2023-05-13 DIAGNOSIS — J309 Allergic rhinitis, unspecified: Secondary | ICD-10-CM

## 2023-05-13 MED ORDER — TRAMADOL HCL 50 MG PO TABS: 50.0000 mg | ORAL_TABLET | Freq: Three times a day (TID) | ORAL | 0 refills | Status: AC | PRN

## 2023-05-13 MED ORDER — DOXYCYCLINE HYCLATE 100 MG PO TABS: 100.0000 mg | ORAL_TABLET | Freq: Two times a day (BID) | ORAL | 0 refills | Status: DC

## 2023-05-13 MED ORDER — FLUTICASONE PROPIONATE 50 MCG/ACT NA SUSP: 2.0000 | Freq: Every day | NASAL | 1 refills | Status: DC

## 2023-05-13 NOTE — Patient Instructions (Signed)
Right Rib Pain Pain localized to the right lower rib cage, exacerbated by sneezing and certain movements. No pain with deep breathing. Possible muscle strain or rib injury. -Order chest and rib x-ray to rule out fracture or other pathology.  Left Testicular Pain New onset following sneezing spell, with associated penile tingling and discomfort. No prior history of testicular pain. Possible epididymitis or other testicular pathology. -Order scrotal ultrasound focusing on left testicle. -Start Doxycycline twice daily for 10 days as coverage for possible epididymitis.  Post-Prostatectomy Urinary Incontinence Ongoing urinary incontinence following recent radical prostatectomy, with some degree of control maintained. Expected post-operative symptom. -Continue current management and monitor for improvement. Refilled tramadol pain med.  Sinus Congestion Recurrent symptoms suggestive of a cold, with yellow nasal discharge. Possible sinus infection superimposed on underlying allergies. -Start Flonase nasal spray for allergy symptoms. -Start Doxycycline twice daily for 10 days for possible sinus infection.  Post-Prostatectomy Pain Management Ongoing pain following recent radical prostatectomy, exacerbated by sneezing and certain movements. -Refill Tramadol prescription (20 tablets, 1 every 6 hours as needed for pain).  Prostate Cancer Recent radical prostatectomy for stage 3 prostate cancer, with follow-up appointment scheduled with urologist. -Continue current follow-up plan with urologist.   Explained get US scrotum by tomorrow at latest. If abdomen or testicle pain worsen then be see in the ED.    Follow up in 10 days or sooner if needed.

## 2023-05-13 NOTE — Progress Notes (Signed)
Subjective:    Patient ID: Walter Nichols, male    DOB: 1971-06-24, 52 y.o.   MRN: [id number]  HPI  Discussed the use of AI scribe software for clinical note transcription with the patient, who gave verbal consent to proceed.  History of Present Illness   The patient, with a recent history of radical prostatectomy for prostate cancer, presents with new onset of right lower rib pain and testicular discomfort. The rib pain began after a sneezing spell, and is described as a consistent level three pain, not exacerbated by deep breathing but occasionally worsened by certain movements. The patient has found relief by raising his arm.  The testicular discomfort new onset of pain in the left testicle following the aforementioned sneezing spell. He is unsure if this is related to the post-surgical diapers they are wearing due to urinary incontinence. The incontinence is described as mild, with some sense of control remaining, but leakage occurring with coughing or sneezing.  The patient also reports feeling as though he is coming down with a cold, with symptoms cycling on and off past 2 weeks. He describes a cold, chilly, sweaty feeling and yellow nasal mucus, but deny significant coughing. He has a history of seasonal allergies but are not currently on any allergy medication.  The patient's recent follow-up with his urologist revealed that the prostate cancer was more aggressive than initially thought, with the cancer reaching the edges of the prostate. The patient is scheduled for another follow-up in November.       Review of Systems  Constitutional:  Negative for chills and fever.  HENT:  Positive for congestion and sinus pain.   Respiratory:  Negative for cough, chest tightness and wheezing.        Left lower rib pain  Cardiovascular:  Negative for chest pain and palpitations.  Gastrointestinal:  Negative for abdominal pain, blood in stool, constipation and nausea.  Genitourinary:        See  hpi.  Musculoskeletal:        Rt lower rib pain.  Skin:  Negative for rash.  Neurological:  Negative for dizziness, syncope, numbness and headaches.  Hematological:  Negative for adenopathy. Does not bruise/bleed easily.  Psychiatric/Behavioral:  Negative for behavioral problems and confusion.     Past Medical History:  Diagnosis Date   Alcoholism (HCC)    Anal fissure    Anxiety    Cancer (HCC)    prostate   Depression    Hypertension    Migraine    Sleep apnea      Social History   Socioeconomic History   Marital status: Single    Spouse name: Not on file   Number of children: Not on file   Years of education: Not on file   Highest education level: Not on file  Occupational History   Not on file  Tobacco Use   Smoking status: Every Day    Current packs/day: 0.50    Types: Cigarettes   Smokeless tobacco: Never  Vaping Use   Vaping status: Never Used  Substance and Sexual Activity   Alcohol use: Not Currently    Comment: 40 oz a day/ocassionally    Drug use: Not Currently    Comment: in the past used coccaine. 3 yrs ago.   Sexual activity: Never  Other Topics Concern   Not on file  Social History Narrative   Not on file   Social Determinants of Health   Financial Resource Strain: Not on file  Food Insecurity: Food Insecurity Present (04/28/2023)   Hunger Vital Sign    Worried About Running Out of Food in the Last Year: Sometimes true    Ran Out of Food in the Last Year: Sometimes true  Transportation Needs: No Transportation Needs (04/28/2023)   PRAPARE - Administrator, Civil Service (Medical): No    Lack of Transportation (Non-Medical): No  Physical Activity: Not on file  Stress: Not on file  Social Connections: Not on file  Intimate Partner Violence: Not At Risk (04/28/2023)   Humiliation, Afraid, Rape, and Kick questionnaire    Fear of Current or Ex-Partner: No    Emotionally Abused: No    Physically Abused: No    Sexually Abused: No     Past Surgical History:  Procedure Laterality Date   HEMORROIDECTOMY     x2   LYMPHADENECTOMY Bilateral 04/28/2023   Procedure: BILATERAL PELVIC LYMPHADENECTOMY;  Surgeon: Heloise Purpura, MD;  Location: [facility];  Service: Urology;  Laterality: Bilateral;   ROBOT ASSISTED LAPAROSCOPIC RADICAL PROSTATECTOMY N/A 04/28/2023   Procedure: XI ROBOTIC ASSISTED LAPAROSCOPIC RADICAL PROSTATECTOMY LEVEL 2;  Surgeon: Heloise Purpura, MD;  Location: [facility];  Service: Urology;  Laterality: N/A;  210 MINUTES NEEDED FOR CASE    Family History  Problem Relation Age of Onset   Prostate cancer Maternal Uncle 59   Lung cancer Maternal Grandmother 72   Colon cancer Neg Hx    Esophageal cancer Neg Hx    Rectal cancer Neg Hx    Stomach cancer Neg Hx     No Known Allergies  Current Outpatient Medications on File Prior to Visit  Medication Sig Dispense Refill   AMBULATORY NON FORMULARY MEDICATION Nitroglycerin ointment 0.125% apply BID rectally for 6-8 weeks 30 g 0   amLODipine (NORVASC) 10 MG tablet TAKE 1 TABLET BY MOUTH EVERY DAY 90 tablet 0   atorvastatin (LIPITOR) 10 MG tablet TAKE 1 TABLET BY MOUTH EVERY DAY (Patient not taking: Reported on 04/08/2023) 90 tablet 1   atorvastatin (LIPITOR) 10 MG tablet TAKE 1 TABLET BY MOUTH EVERY DAY 90 tablet 1   busPIRone (BUSPAR) 15 MG tablet TAKE 1 TABLET BY MOUTH 3 TIMES DAILY. (Patient taking differently: Take 15 mg by mouth 3 (three) times daily as needed (anxiety).) 270 tablet 1   cyclobenzaprine (FLEXERIL) 5 MG tablet 1 tab po q hs prn tension ha (Patient not taking: Reported on 04/08/2023) 7 tablet 0   docusate sodium (COLACE) 100 MG capsule Take 100 mg by mouth daily.     famotidine (PEPCID) 20 MG tablet TAKE 1 TABLET BY MOUTH EVERY DAY 90 tablet 1   fenofibrate (TRICOR) 48 MG tablet TAKE 1 TABLET BY MOUTH EVERY DAY 90 tablet 0   losartan (COZAAR) 25 MG tablet TAKE 1 TABLET (25 MG TOTAL) BY MOUTH DAILY. 90 tablet 2   nicotine (NICODERM CQ - DOSED IN MG/24  HOURS) 21 mg/24hr patch Place 1 patch (21 mg total) onto the skin daily. 28 patch 0   sildenafil (VIAGRA) 50 MG tablet 1 tab po as needed one hour prior to sex 10 tablet 0   sulfamethoxazole-trimethoprim (BACTRIM DS) 800-160 MG tablet Take 1 tablet by mouth 2 (two) times daily. Start the day prior to foley removal appointment 6 tablet 0   traMADol (ULTRAM) 50 MG tablet Take 1-2 tablets (50-100 mg total) by mouth every 6 (six) hours as needed for moderate pain or severe pain. 20 tablet 0   traZODone (DESYREL) 50 MG tablet TAKE  1/2 TAB AT NIGHT (Patient not taking: Reported on 04/08/2023) 45 tablet 1   traZODone (DESYREL) 50 MG tablet TAKE 1/2 TO 1 TABLET BY MOUTH AT BEDTIME AS NEEDED FOR SLEEP (Patient not taking: Reported on 04/08/2023) 90 tablet 2   traZODone (DESYREL) 50 MG tablet Take 0.5-1 tablets (25-50 mg total) by mouth at bedtime as needed for sleep. 30 tablet 3   triamcinolone (NASACORT) 55 MCG/ACT AERO nasal inhaler Place 1 spray into the nose daily as needed (allergies).     venlafaxine XR (EFFEXOR XR) 150 MG 24 hr capsule Take 1 capsule (150 mg total) by mouth daily with breakfast. 30 capsule 11   No current facility-administered medications on file prior to visit.    BP 131/81 (BP Location: Right Arm, Patient Position: Sitting, Cuff Size: Large)   Pulse 76   Resp 18   Ht 6\' 2"  (1.88 m)   Wt 269 lb (122 kg)   SpO2 100%   BMI 34.54 kg/m        Objective:   Physical Exam  General Mental Status- Alert. General Appearance- Not in acute distress.   Skin General: Color- Normal Color. Moisture- Normal Moisture.  Neck Carotid Arteries- Normal color. Moisture- Normal Moisture. No carotid bruits. No JVD.  Chest and Lung Exam Auscultation: Breath Sounds:-Normal.  Cardiovascular Auscultation:Rythm- Regular. Murmurs & Other Heart Sounds:Auscultation of the heart reveals- No Murmurs.  Abdomen Inspection:-Inspeection Normal. Palpation/Percussion:Note:No mass. Palpation and  Percussion of the abdomen reveal- Non Tender, Non Distended + BS, no rebound or guarding. On review of trocar sites no redness, no swelling. No dc.   Neurologic Cranial Nerve exam:- CN III-XII intact(No nystagmus), symmetric smile. Strength:- 5/5 equal and symmetric strength both upper and lower extremities.   Genital- rt testicle not tender. Left testicle mild tender to palpation over epididyis. On exam of inguinal canals no hernia.  Anteror chest- rt  lower rib pain tender. On inspection of skin no rash.    Assessment & Plan:   Assessment and Plan    Right Rib Pain Pain localized to the right lower rib cage, exacerbated by sneezing and certain movements. No pain with deep breathing. Possible muscle strain or rib injury. -Order chest and rib x-ray to rule out fracture or other pathology.  Left Testicular Pain New onset following sneezing spell, with associated penile tingling and discomfort. No prior history of testicular pain. Possible epididymitis or other testicular pathology. -Order scrotal ultrasound focusing on left testicle. -Start Doxycycline twice daily for 10 days as coverage for possible epididymitis.  Post-Prostatectomy Urinary Incontinence Ongoing urinary incontinence following recent radical prostatectomy, with some degree of control maintained. Expected post-operative symptom. -Continue current management and monitor for improvement. Refilled tramadol pain med.  Sinus Congestion Recurrent symptoms suggestive of a cold, with yellow nasal discharge. Possible sinus infection superimposed on underlying allergies. -Start Flonase nasal spray for allergy symptoms. -Start Doxycycline twice daily for 10 days for possible sinus infection.  Post-Prostatectomy Pain Management Ongoing pain following recent radical prostatectomy, exacerbated by sneezing and certain movements. -Refill Tramadol prescription (20 tablets, 1 every 6 hours as needed for pain).  Prostate  Cancer Recent radical prostatectomy for stage 3 prostate cancer, with follow-up appointment scheduled with urologist. -Continue current follow-up plan with urologist.   Explained get US scrotum by tomorrow at latest. If abdomen or testicle pain worsen then be see in the ED.    Follow up in 10 days or sooner if needed.         Esperanza Richters,  PA-C

## 2023-05-14 ENCOUNTER — Ambulatory Visit (HOSPITAL_BASED_OUTPATIENT_CLINIC_OR_DEPARTMENT_OTHER)
Admission: RE | Admit: 2023-05-14 | Discharge: 2023-05-14 | Disposition: A | Discharge status: Home / Self Care | Payer: 59 | Source: Ambulatory Visit | Attending: Medical | Admitting: Medical

## 2023-05-14 DIAGNOSIS — N50812 Left testicular pain: Secondary | ICD-10-CM | POA: Insufficient documentation

## 2023-05-20 ENCOUNTER — Telehealth: Payer: Self-pay | Admitting: Medical

## 2023-05-20 ENCOUNTER — Encounter: Payer: Self-pay | Admitting: Medical

## 2023-05-20 MED ORDER — LEVOFLOXACIN 500 MG PO TABS
500.0000 mg | ORAL_TABLET | Freq: Every day | ORAL | 0 refills | Status: AC
Start: 2023-05-20 — End: 2023-05-27

## 2023-05-20 NOTE — Telephone Encounter (Signed)
Pt called and lvm to return call, will send mychart as well

## 2023-05-20 NOTE — Telephone Encounter (Signed)
Pt called & stated that he is still in great pain when moving around after recent procedure and mentioned [name] suggested that he can receive a different medication if his prescription for doxycycline (VIBRA-TABS) 100 MG tablet did not work for him. Pt is requesting other medication. Please call & advise.

## 2023-05-20 NOTE — Addendum Note (Signed)
Addended by: Gwenevere Abbot on: 05/20/2023 01:00 PM   Modules accepted: Orders

## 2023-05-20 NOTE — Telephone Encounter (Signed)
See prev message

## 2023-05-30 NOTE — Telephone Encounter (Signed)
Pt said he is still having pain and wants to see what Walter Nichols recommends. Patient scheduled for Wednesday to see provider as well but please call to advise if he needs to do something different.

## 2023-06-01 ENCOUNTER — Ambulatory Visit (INDEPENDENT_AMBULATORY_CARE_PROVIDER_SITE_OTHER): Payer: 59 | Admitting: Medical

## 2023-06-01 ENCOUNTER — Encounter: Payer: Self-pay | Admitting: Medical

## 2023-06-01 ENCOUNTER — Ambulatory Visit (HOSPITAL_BASED_OUTPATIENT_CLINIC_OR_DEPARTMENT_OTHER)
Admission: RE | Admit: 2023-06-01 | Discharge: 2023-06-01 | Disposition: A | Discharge status: Home / Self Care | Payer: 59 | Source: Ambulatory Visit | Attending: Medical | Admitting: Medical

## 2023-06-01 VITALS — BP 136/78 | HR 49 | Temp 97.8°F | Resp 18 | Ht 74.0 in | Wt 262.2 lb

## 2023-06-01 DIAGNOSIS — N50812 Left testicular pain: Secondary | ICD-10-CM | POA: Insufficient documentation

## 2023-06-01 DIAGNOSIS — R1084 Generalized abdominal pain: Secondary | ICD-10-CM | POA: Diagnosis not present

## 2023-06-01 DIAGNOSIS — R300 Dysuria: Secondary | ICD-10-CM

## 2023-06-01 DIAGNOSIS — N50811 Right testicular pain: Secondary | ICD-10-CM | POA: Insufficient documentation

## 2023-06-01 DIAGNOSIS — G4700 Insomnia, unspecified: Secondary | ICD-10-CM | POA: Diagnosis not present

## 2023-06-01 LAB — COMPREHENSIVE METABOLIC PANEL WITH GFR
ALT: 16 U/L (ref 0–53)
AST: 15 U/L (ref 0–37)
Albumin: 4.8 g/dL (ref 3.5–5.2)
Alkaline Phosphatase: 89 U/L (ref 39–117)
BUN: 6 mg/dL (ref 6–23)
CO2: 27 meq/L (ref 19–32)
Calcium: 9.7 mg/dL (ref 8.4–10.5)
Chloride: 108 meq/L (ref 96–112)
Creatinine, Ser: 1.05 mg/dL (ref 0.40–1.50)
GFR: 82.56 mL/min (ref >60.00)
Glucose, Bld: 73 mg/dL (ref 70–99)
Potassium: 4.1 meq/L (ref 3.5–5.1)
Sodium: 141 meq/L (ref 135–145)
Total Bilirubin: 0.4 mg/dL (ref 0.2–1.2)
Total Protein: 7.6 g/dL (ref 6.0–8.3)

## 2023-06-01 LAB — URINALYSIS, ROUTINE W REFLEX MICROSCOPIC
Bilirubin Urine: NEGATIVE
Hgb urine dipstick: NEGATIVE
Ketones, ur: NEGATIVE
Leukocytes,Ua: NEGATIVE
Nitrite: NEGATIVE
RBC / HPF: NONE SEEN (ref 0–?)
Specific Gravity, Urine: 1.015 (ref 1.000–1.030)
Total Protein, Urine: 30 — AB
Urine Glucose: NEGATIVE
Urobilinogen, UA: 0.2 (ref 0.0–1.0)
pH: 6 (ref 5.0–8.0)

## 2023-06-01 LAB — CBC WITH DIFFERENTIAL/PLATELET
Basophils Absolute: 0.1 10*3/uL (ref 0.0–0.1)
Basophils Relative: 0.9 % (ref 0.0–3.0)
Eosinophils Absolute: 0.6 10*3/uL (ref 0.0–0.7)
Eosinophils Relative: 11.1 % — ABNORMAL HIGH (ref 0.0–5.0)
HCT: 41.5 % (ref 39.0–52.0)
Hemoglobin: 13.3 g/dL (ref 13.0–17.0)
Lymphocytes Relative: 36.7 % (ref 12.0–46.0)
Lymphs Abs: 2 10*3/uL (ref 0.7–4.0)
MCHC: 32.1 g/dL (ref 30.0–36.0)
MCV: 85.9 fl (ref 78.0–100.0)
Monocytes Absolute: 0.6 10*3/uL (ref 0.1–1.0)
Monocytes Relative: 9.9 % (ref 3.0–12.0)
Neutro Abs: 2.3 10*3/uL (ref 1.4–7.7)
Neutrophils Relative %: 41.4 % — ABNORMAL LOW (ref 43.0–77.0)
Platelets: 419 10*3/uL — ABNORMAL HIGH (ref 150.0–400.0)
RBC: 4.83 Mil/uL (ref 4.22–5.81)
RDW: 15 % (ref 11.5–15.5)
WBC: 5.6 10*3/uL (ref 4.0–10.5)

## 2023-06-01 MED ORDER — CEFTRIAXONE SODIUM 1 G IJ SOLR
1.0000 g | Freq: Once | INTRAMUSCULAR | Status: AC
Start: 2023-06-01 — End: 2023-06-01
  Administered 2023-06-01: 1 g via INTRAMUSCULAR

## 2023-06-01 MED ORDER — TRAZODONE HCL 50 MG PO TABS: 25.0000 mg | ORAL_TABLET | Freq: Every evening | ORAL | 3 refills | Status: DC | PRN

## 2023-06-01 NOTE — Patient Instructions (Signed)
1. Pain in left and rt  testicle. On last visit left side  orchtis on [place] reported. Some symptom improvement in that no pain in morning but later in day pain persists -rocephin 1 gram im - US SCROTUM W/DOPPLER; Future -after [place] repeat review will place referral back to urologist -can use low dose ibuprofen 200-400 mg every 8 hours as needed  2. Dysuria -rocephin 1 gram - Urinalysis, Routine w reflex microscopic - Urine Culture  3. Insomnia, unspecified type -refill trazadone   4.abd pain- mild around trochar site. Decided to get cbc and cmp  Follow up date to be determined after lab review.

## 2023-06-01 NOTE — Progress Notes (Signed)
Subjective:    Patient ID: Walter Nichols, male    DOB: [birth year], 52 y.o.   MRN: [id number]  HPI  Pt in with some persisting testicle pain. Pt reports some tingling at head of his penis.    I had seen pt back on 05-13-2023 and rx'd doxycycline. Pt had prostatectomy in past for prostate cancer. Pt left scrotal US showed below   IMPRESSION: 1. Mild left testicular hyperemia suggesting orchitis.  I had advised on my chart    05-16-2023 "Your [place] of testicle showed Mild left testicular hyperemia suggesting orchitis. If you have any residual testicle pain then you can stop doxycycline and start levofloxin. However if your pain is much better then continue doxycycline. Let me know how you are doing. Levofoloxin in some cases can be better. But some tendon side effects can occur rarely. So if you are much better then continue with doxycycline. If not then will rx levofloxin and have you stop doxycycyline."   Around 05-20-2023 pt contact back stating he was still in paint. I went ahead and rx levofloxin for at that time. Pt took second antibiotic but he is still having some daily testicle pain. In the morning he states no siginificant pain. But gradually during the day pain worses. When has bowel movement, cough or sneeze will have pain.   On last visit left testicle was hurting. Now having some rt testicle pain.  Pain on urination is intermittent.  Pt talked to [facility]. Receptionst staff aware. No clear if urologist knows.    Pt states recently since dealing with stress with court proceedings of someone suing him and his sister. Now strained relationship with sister due to these events.     Review of Systems  Constitutional:  Negative for chills, fatigue and fever.  Respiratory:  Negative for cough, chest tightness and wheezing.   Cardiovascular:  Negative for chest pain and palpitations.  Gastrointestinal:  Negative for abdominal pain, diarrhea and vomiting.  Genitourinary:   Positive for dysuria. Negative for difficulty urinating, frequency, hematuria, testicular pain and urgency.  Musculoskeletal:  Negative for back pain and neck pain.  Neurological:  Negative for dizziness and speech difficulty.  Hematological:  Negative for adenopathy. Does not bruise/bleed easily.   Past Medical History:  Diagnosis Date   Alcoholism (HCC)    Anal fissure    Anxiety    Cancer (HCC)    prostate   Depression    Hypertension    Migraine    Sleep apnea      Social History   Socioeconomic History   Marital status: Single    Spouse name: Not on file   Number of children: Not on file   Years of education: Not on file   Highest education level: Not on file  Occupational History   Not on file  Tobacco Use   Smoking status: Every Day    Current packs/day: 0.50    Types: Cigarettes   Smokeless tobacco: Never  Vaping Use   Vaping status: Never Used  Substance and Sexual Activity   Alcohol use: Not Currently    Comment: 40 oz a day/ocassionally    Drug use: Not Currently    Comment: in the past used coccaine. 3 yrs ago.   Sexual activity: Never  Other Topics Concern   Not on file  Social History Narrative   Not on file   Social Determinants of Health   Financial Resource Strain: Not on file  Food Insecurity: Food Insecurity  Present (04/28/2023)   Hunger Vital Sign    Worried About Running Out of Food in the Last Year: Sometimes true    Ran Out of Food in the Last Year: Sometimes true  Transportation Needs: No Transportation Needs (04/28/2023)   PRAPARE - Administrator, Civil Service (Medical): No    Lack of Transportation (Non-Medical): No  Physical Activity: Not on file  Stress: Not on file  Social Connections: Not on file  Intimate Partner Violence: Not At Risk (04/28/2023)   Humiliation, Afraid, Rape, and Kick questionnaire    Fear of Current or Ex-Partner: No    Emotionally Abused: No    Physically Abused: No    Sexually Abused: No     Past Surgical History:  Procedure Laterality Date   HEMORROIDECTOMY     x2   LYMPHADENECTOMY Bilateral 04/28/2023   Procedure: BILATERAL PELVIC LYMPHADENECTOMY;  Surgeon: Heloise Purpura, MD;  Location: [facility];  Service: Urology;  Laterality: Bilateral;   ROBOT ASSISTED LAPAROSCOPIC RADICAL PROSTATECTOMY N/A 04/28/2023   Procedure: XI ROBOTIC ASSISTED LAPAROSCOPIC RADICAL PROSTATECTOMY LEVEL 2;  Surgeon: Heloise Purpura, MD;  Location: [facility];  Service: Urology;  Laterality: N/A;  210 MINUTES NEEDED FOR CASE    Family History  Problem Relation Age of Onset   Prostate cancer Maternal Uncle 59   Lung cancer Maternal Grandmother 72   Colon cancer Neg Hx    Esophageal cancer Neg Hx    Rectal cancer Neg Hx    Stomach cancer Neg Hx     No Known Allergies  Current Outpatient Medications on File Prior to Visit  Medication Sig Dispense Refill   AMBULATORY NON FORMULARY MEDICATION Nitroglycerin ointment 0.125% apply BID rectally for 6-8 weeks 30 g 0   amLODipine (NORVASC) 10 MG tablet TAKE 1 TABLET BY MOUTH EVERY DAY 90 tablet 0   atorvastatin (LIPITOR) 10 MG tablet TAKE 1 TABLET BY MOUTH EVERY DAY 90 tablet 1   busPIRone (BUSPAR) 15 MG tablet TAKE 1 TABLET BY MOUTH 3 TIMES DAILY. (Patient taking differently: Take 15 mg by mouth 3 (three) times daily as needed (anxiety).) 270 tablet 1   cyclobenzaprine (FLEXERIL) 5 MG tablet 1 tab po q hs prn tension ha 7 tablet 0   docusate sodium (COLACE) 100 MG capsule Take 100 mg by mouth daily.     famotidine (PEPCID) 20 MG tablet TAKE 1 TABLET BY MOUTH EVERY DAY 90 tablet 1   fenofibrate (TRICOR) 48 MG tablet TAKE 1 TABLET BY MOUTH EVERY DAY 90 tablet 0   fluticasone (FLONASE) 50 MCG/ACT nasal spray Place 2 sprays into both nostrils daily. 16 g 1   losartan (COZAAR) 25 MG tablet TAKE 1 TABLET (25 MG TOTAL) BY MOUTH DAILY. 90 tablet 2   nicotine (NICODERM CQ - DOSED IN MG/24 HOURS) 21 mg/24hr patch Place 1 patch (21 mg total) onto the skin daily. 28  patch 0   sildenafil (VIAGRA) 50 MG tablet 1 tab po as needed one hour prior to sex 10 tablet 0   traMADol (ULTRAM) 50 MG tablet Take 1-2 tablets (50-100 mg total) by mouth every 6 (six) hours as needed for moderate pain or severe pain. 20 tablet 0   triamcinolone (NASACORT) 55 MCG/ACT AERO nasal inhaler Place 1 spray into the nose daily as needed (allergies).     venlafaxine XR (EFFEXOR XR) 150 MG 24 hr capsule Take 1 capsule (150 mg total) by mouth daily with breakfast. 30 capsule 11   atorvastatin (LIPITOR) 10  MG tablet TAKE 1 TABLET BY MOUTH EVERY DAY (Patient not taking: Reported on 04/08/2023) 90 tablet 1   traZODone (DESYREL) 50 MG tablet TAKE 1/2 TAB AT NIGHT (Patient not taking: Reported on 04/08/2023) 45 tablet 1   traZODone (DESYREL) 50 MG tablet TAKE 1/2 TO 1 TABLET BY MOUTH AT BEDTIME AS NEEDED FOR SLEEP (Patient not taking: Reported on 04/08/2023) 90 tablet 2   No current facility-administered medications on file prior to visit.    BP 136/78 (BP Location: Left Arm, Patient Position: Sitting, Cuff Size: Large)   Pulse (!) 49   Temp 97.8 F (36.6 C) (Oral)   Resp 18   Ht 6\' 2"  (1.88 m)   Wt 262 lb 3.2 oz (118.9 kg)   SpO2 99%   BMI 33.66 kg/m        Objective:   Physical Exam  General- No acute distress. Pleasant patient. Neck- Full range of motion, no jvd Lungs- Clear, even and unlabored. Heart- regular rate and rhythm. Neurologic- CNII- XII grossly intact.  Abdomen- faint tender over prior trochar sites from prostatetcomy. Back- no cva tenderness  Genital exam- both testicle mild tender in area of epidiymus. No obvious hernia in inguinal area.        Assessment & Plan:   Patient Instructions  1. Pain in left and rt  testicle. On last visit left side  orchtis on [place] reported. Some symptom improvement in that no pain in morning but later in day pain persists -rocephin 1 gram im - US SCROTUM W/DOPPLER; Future -after [place] repeat review will place referral back to  urologist -can use low dose ibuprofen 200-400 mg every 8 hours as needed  2. Dysuria -rocephin 1 gram - Urinalysis, Routine w reflex microscopic - Urine Culture  3. Insomnia, unspecified type -refill trazadone   4.abd pain- mild around trochar site. Decided to get cbc and cmp  Follow up date to be determined after lab review.     Esperanza Richters, PA-C

## 2023-06-02 LAB — URINE CULTURE
MICRO NUMBER:: 15483486
Result:: NO GROWTH
SPECIMEN QUALITY:: ADEQUATE

## 2023-06-02 NOTE — Addendum Note (Signed)
Addended by: Gwenevere Abbot on: 06/02/2023 08:11 AM   Modules accepted: Orders

## 2023-06-04 ENCOUNTER — Other Ambulatory Visit: Payer: Self-pay | Admitting: Medical

## 2023-06-17 ENCOUNTER — Telehealth: Payer: Self-pay | Admitting: Medical

## 2023-06-17 NOTE — Telephone Encounter (Signed)
Pt called and stated that he would like to speak with pcp or nurse regarding his recent ultrasound results. Additionally, he mentioned that he needs to discuss receiving documentation for intermittent leave. Please call and advise pt.

## 2023-06-20 NOTE — Telephone Encounter (Signed)
Pt called and lvm to return call and schedule an appt

## 2023-06-20 NOTE — Telephone Encounter (Signed)
Pt called back and stated he will call back once he receives the info from his HR dept for the medical accommodations.

## 2023-06-29 ENCOUNTER — Encounter: Payer: Self-pay | Admitting: Medical

## 2023-06-29 ENCOUNTER — Ambulatory Visit (INDEPENDENT_AMBULATORY_CARE_PROVIDER_SITE_OTHER): Payer: 59 | Admitting: Medical

## 2023-06-29 VITALS — BP 132/88 | HR 63 | Ht 74.0 in | Wt 259.6 lb

## 2023-06-29 DIAGNOSIS — J309 Allergic rhinitis, unspecified: Secondary | ICD-10-CM | POA: Diagnosis not present

## 2023-06-29 DIAGNOSIS — G4700 Insomnia, unspecified: Secondary | ICD-10-CM

## 2023-06-29 DIAGNOSIS — N393 Stress incontinence (female) (male): Secondary | ICD-10-CM

## 2023-06-29 DIAGNOSIS — N433 Hydrocele, unspecified: Secondary | ICD-10-CM

## 2023-06-29 DIAGNOSIS — F419 Anxiety disorder, unspecified: Secondary | ICD-10-CM | POA: Diagnosis not present

## 2023-06-29 DIAGNOSIS — F329 Major depressive disorder, single episode, unspecified: Secondary | ICD-10-CM

## 2023-06-29 MED ORDER — BENZONATATE 100 MG PO CAPS: 100.0000 mg | ORAL_CAPSULE | Freq: Three times a day (TID) | ORAL | 0 refills | Status: DC | PRN

## 2023-06-29 MED ORDER — LEVOCETIRIZINE DIHYDROCHLORIDE 5 MG PO TABS
5.0000 mg | ORAL_TABLET | Freq: Every evening | ORAL | 3 refills | Status: DC
Start: 2023-06-29 — End: 2023-09-26

## 2023-06-29 MED ORDER — FLUTICASONE PROPIONATE 50 MCG/ACT NA SUSP
2.0000 | Freq: Every day | NASAL | 1 refills | Status: AC
Start: 2023-06-29 — End: ?

## 2023-06-29 MED ORDER — AZITHROMYCIN 250 MG PO TABS
ORAL_TABLET | ORAL | 0 refills | Status: AC
Start: 2023-06-29 — End: 2023-07-04
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
## 2023-06-29 NOTE — Patient Instructions (Addendum)
Reactive depression, Insomnia and anxiety -continue effexor and buspar. If you back on normal sleep schedule advise get back on trazadone.  -if thought of harm to self or others be seen in [place] [facility] or [place] [facility]. -urgent care number [phone] -gave list of psychiatrist and counselor. Please call and identify practice that will see you. Let me know which practice will see you. I will place the referral when you update me. -let me know if you need further short term disabilty paperwork.  Allergic rhinitis, unspecified seasonality, unspecified trigger -flonase nasal spray -xyzal 5 mg daily q hs  Pharyngitis- by exam bright red and rx azithromycin for clinical concern strep  Stress incontinence of urine -some described on sneezing and cough. -Urologist may be able to give further treatment related to post prostatectomy  Hydrocele, unspecified hydrocele type  -placed referral back to urologist.  Follow up one month or sooner if needed.

## 2023-07-06 ENCOUNTER — Telehealth: Payer: Self-pay | Admitting: Medical

## 2023-07-06 ENCOUNTER — Encounter: Payer: Self-pay | Admitting: Medical

## 2023-07-06 NOTE — Telephone Encounter (Signed)
Pt called to advise Walter Nichols that he has appt on 11/1 for [facility]. He also said that he plans to go back to work on 07/25/23 and wanted Walter Nichols Saguier/CMA to reach out to [facility] to let them know the plan. Please call if any questions

## 2023-07-08 NOTE — Telephone Encounter (Signed)
Pt called back to let pcp know that [facility] is needing the most recent OV notes and confirmation that [facility] is a patient under [facility] care. Please call and advise.

## 2023-07-11 NOTE — Telephone Encounter (Signed)
Sent pt mychart message to obtain fax number for [facility]

## 2023-07-13 ENCOUNTER — Other Ambulatory Visit: Payer: Self-pay | Admitting: Medical

## 2023-07-13 NOTE — Telephone Encounter (Signed)
Pt called back with the following fax number:  [phone]  Pt advised that paperwork needed to be in today ideally, tomorrow at the latest.

## 2023-07-13 NOTE — Telephone Encounter (Signed)
Ov notes faxed 

## 2023-07-17 ENCOUNTER — Other Ambulatory Visit: Payer: Self-pay | Admitting: Medical

## 2023-07-27 ENCOUNTER — Ambulatory Visit (INDEPENDENT_AMBULATORY_CARE_PROVIDER_SITE_OTHER): Payer: 59 | Admitting: Medical

## 2023-07-27 VITALS — BP 137/77 | HR 65 | Temp 97.8°F | Resp 16 | Ht 74.0 in | Wt 259.0 lb

## 2023-07-27 DIAGNOSIS — F419 Anxiety disorder, unspecified: Secondary | ICD-10-CM

## 2023-07-27 DIAGNOSIS — J309 Allergic rhinitis, unspecified: Secondary | ICD-10-CM

## 2023-07-27 DIAGNOSIS — R5383 Other fatigue: Secondary | ICD-10-CM

## 2023-07-27 DIAGNOSIS — F329 Major depressive disorder, single episode, unspecified: Secondary | ICD-10-CM

## 2023-07-27 DIAGNOSIS — N433 Hydrocele, unspecified: Secondary | ICD-10-CM

## 2023-07-27 DIAGNOSIS — C61 Malignant neoplasm of prostate: Secondary | ICD-10-CM

## 2023-07-27 LAB — COMPREHENSIVE METABOLIC PANEL
ALT: 13 U/L (ref 0–53)
AST: 15 U/L (ref 0–37)
Albumin: 4.4 g/dL (ref 3.5–5.2)
Alkaline Phosphatase: 88 U/L (ref 39–117)
BUN: 6 mg/dL (ref 6–23)
CO2: 27 meq/L (ref 19–32)
Calcium: 9.7 mg/dL (ref 8.4–10.5)
Chloride: 107 meq/L (ref 96–112)
Creatinine, Ser: 1.15 mg/dL (ref 0.40–1.50)
GFR: 73.94 mL/min (ref >60.00)
Glucose, Bld: 107 mg/dL — ABNORMAL HIGH (ref 70–99)
Potassium: 3.8 meq/L (ref 3.5–5.1)
Sodium: 140 meq/L (ref 135–145)
Total Bilirubin: 0.5 mg/dL (ref 0.2–1.2)
Total Protein: 7.1 g/dL (ref 6.0–8.3)

## 2023-07-27 LAB — TSH: TSH: 0.86 u[IU]/mL (ref 0.35–5.50)

## 2023-07-27 LAB — VITAMIN B12: Vitamin B-12: 287 pg/mL (ref 211–911)

## 2023-07-27 NOTE — Progress Notes (Addendum)
Subjective:    Patient ID: Walter Nichols, male    DOB: 03-Mar-1971, 52 y.o.   MRN: [id number]  HPI Last visit AVS below in "  "Reactive depression, Insomnia and anxiety -continue effexor and buspar. If you back on normal sleep schedule advise get back on trazadone.  -if thought of harm to self or others be seen in [place] [facility] or [place] [facility]. -urgent care number [phone] -gave list of psychiatrist and counselor. Please call and identify practice that will see you. Let me know which practice will see you. I will place the referral when you update me. -let me know if you need further short term disabilty paperwork.   Allergic rhinitis, unspecified seasonality, unspecified trigger -flonase nasal spray -xyzal 5 mg daily q hs   Stress incontinence of urine -some described on sneezing and cough. -Urologist may be able to give further treatment related to post prostatecomy   Hydrocele, unspecified hydrocele type  -placed referral back to urologist."  Discussed the use of AI scribe software for clinical note transcription with the patient, who gave verbal consent to proceed.  History of Present Illness   The patient, with a history of prostate cancer, presents with ongoing fatigue, dizziness, and a sensation of 'brain fog.' He reports that even mundane activities, such as showering and dressing, leave him feeling exhausted. He also describes a persistent pain behind his eye. The patient's dizziness is constant, but he notes an improvement in previously reported visual disturbances. Despite these physical symptoms, the patient continues to work, albeit with difficulty.  The patient is currently on a regimen of venlafaxine, Buspar, and trazodone. He reports needing to take more than the prescribed dose of trazodone to achieve sleep. The patient also mentions urinary incontinence, which he manages with Dependence pads, but this issue is causing him significant  distress.  The patient is also dealing with significant psychosocial stressors, including a pending lawsuit and concerns about his prostate cancer prognosis. He expresses a fear about upcoming blood work results and the potential for a positive PSA, indicating a recurrence of his cancer. He also reports feeling 'off,' with difficulty concentrating and instances of forgetfulness, such as leaving his cell phone behind multiple times.  The patient has been seeing a counselor but missed an appointment with a new psychiatrist. He expresses a desire to continue counseling and reschedule the psychiatrist appointment. He also expresses a desire to adjust his work schedule to accommodate his physical symptoms and stress levels.        Review of Systems  Constitutional:  Negative for chills, fatigue and fever.  Respiratory:  Negative for chest tightness, shortness of breath and wheezing.   Cardiovascular:  Negative for chest pain and palpitations.  Gastrointestinal:  Negative for abdominal pain, constipation, nausea and vomiting.  Musculoskeletal:  Negative for back pain, myalgias and neck stiffness.  Skin:  Negative for rash.  Neurological:  Negative for dizziness, speech difficulty, weakness and light-headedness.       Mentioned dizziness in past but not having dizziness presently during exam.  Hematological:  Negative for adenopathy. Does not bruise/bleed easily.  Psychiatric/Behavioral:  Positive for dysphoric mood. Negative for behavioral problems, sleep disturbance and suicidal ideas. The patient is nervous/anxious.     Past Medical History:  Diagnosis Date   Alcoholism (HCC)    Anal fissure    Anxiety    Cancer (HCC)    prostate   Depression    Hypertension    Migraine  Sleep apnea      Social History   Socioeconomic History   Marital status: Single    Spouse name: Not on file   Number of children: Not on file   Years of education: Not on file   Highest education level: Not on  file  Occupational History   Not on file  Tobacco Use   Smoking status: Every Day    Current packs/day: 0.50    Types: Cigarettes   Smokeless tobacco: Never  Vaping Use   Vaping status: Never Used  Substance and Sexual Activity   Alcohol use: Not Currently    Comment: 40 oz a day/ocassionally    Drug use: Not Currently    Comment: in the past used coccaine. 3 yrs ago.   Sexual activity: Never  Other Topics Concern   Not on file  Social History Narrative   Not on file   Social Determinants of Health   Financial Resource Strain: Not on file  Food Insecurity: Food Insecurity Present (04/28/2023)   Hunger Vital Sign    Worried About Running Out of Food in the Last Year: Sometimes true    Ran Out of Food in the Last Year: Sometimes true  Transportation Needs: No Transportation Needs (04/28/2023)   PRAPARE - Administrator, Civil Service (Medical): No    Lack of Transportation (Non-Medical): No  Physical Activity: Not on file  Stress: Not on file  Social Connections: Not on file  Intimate Partner Violence: Not At Risk (04/28/2023)   Humiliation, Afraid, Rape, and Kick questionnaire    Fear of Current or Ex-Partner: No    Emotionally Abused: No    Physically Abused: No    Sexually Abused: No    Past Surgical History:  Procedure Laterality Date   HEMORROIDECTOMY     x2   LYMPHADENECTOMY Bilateral 04/28/2023   Procedure: BILATERAL PELVIC LYMPHADENECTOMY;  Surgeon: Heloise Purpura, MD;  Location: [facility];  Service: Urology;  Laterality: Bilateral;   ROBOT ASSISTED LAPAROSCOPIC RADICAL PROSTATECTOMY N/A 04/28/2023   Procedure: XI ROBOTIC ASSISTED LAPAROSCOPIC RADICAL PROSTATECTOMY LEVEL 2;  Surgeon: Heloise Purpura, MD;  Location: [facility];  Service: Urology;  Laterality: N/A;  210 MINUTES NEEDED FOR CASE    Family History  Problem Relation Age of Onset   Prostate cancer Maternal Uncle 59   Lung cancer Maternal Grandmother 72   Colon cancer Neg Hx    Esophageal cancer  Neg Hx    Rectal cancer Neg Hx    Stomach cancer Neg Hx     No Known Allergies  Current Outpatient Medications on File Prior to Visit  Medication Sig Dispense Refill   AMBULATORY NON FORMULARY MEDICATION Nitroglycerin ointment 0.125% apply BID rectally for 6-8 weeks 30 g 0   amLODipine (NORVASC) 10 MG tablet TAKE 1 TABLET BY MOUTH EVERY DAY 90 tablet 0   atorvastatin (LIPITOR) 10 MG tablet TAKE 1 TABLET BY MOUTH EVERY DAY 90 tablet 1   atorvastatin (LIPITOR) 10 MG tablet TAKE 1 TABLET BY MOUTH EVERY DAY 90 tablet 1   benzonatate (TESSALON) 100 MG capsule Take 1 capsule (100 mg total) by mouth 3 (three) times daily as needed for cough. 30 capsule 0   busPIRone (BUSPAR) 15 MG tablet TAKE 1 TABLET BY MOUTH THREE TIMES A DAY 270 tablet 1   cyclobenzaprine (FLEXERIL) 5 MG tablet 1 tab po q hs prn tension ha 7 tablet 0   docusate sodium (COLACE) 100 MG capsule Take 100 mg by mouth  daily.     famotidine (PEPCID) 20 MG tablet TAKE 1 TABLET BY MOUTH EVERY DAY 90 tablet 1   fenofibrate (TRICOR) 48 MG tablet TAKE 1 TABLET BY MOUTH EVERY DAY 90 tablet 0   fluticasone (FLONASE) 50 MCG/ACT nasal spray SPRAY 2 SPRAYS INTO EACH NOSTRIL EVERY DAY 48 mL 1   fluticasone (FLONASE) 50 MCG/ACT nasal spray Place 2 sprays into both nostrils daily. 16 g 1   levocetirizine (XYZAL) 5 MG tablet Take 1 tablet (5 mg total) by mouth every evening. 30 tablet 3   losartan (COZAAR) 25 MG tablet TAKE 1 TABLET (25 MG TOTAL) BY MOUTH DAILY. 90 tablet 2   nicotine (NICODERM CQ - DOSED IN MG/24 HOURS) 21 mg/24hr patch Place 1 patch (21 mg total) onto the skin daily. 28 patch 0   sildenafil (VIAGRA) 50 MG tablet 1 tab po as needed one hour prior to sex 10 tablet 0   traZODone (DESYREL) 50 MG tablet TAKE 1/2 TAB AT NIGHT 45 tablet 1   traZODone (DESYREL) 50 MG tablet TAKE 1/2 TO 1 TABLET BY MOUTH AT BEDTIME AS NEEDED FOR SLEEP 90 tablet 2   traZODone (DESYREL) 50 MG tablet Take 0.5-1 tablets (25-50 mg total) by mouth at bedtime  as needed for sleep. 30 tablet 3   triamcinolone (NASACORT) 55 MCG/ACT AERO nasal inhaler Place 1 spray into the nose daily as needed (allergies).     venlafaxine XR (EFFEXOR XR) 150 MG 24 hr capsule Take 1 capsule (150 mg total) by mouth daily with breakfast. 30 capsule 11   traMADol (ULTRAM) 50 MG tablet Take 1-2 tablets (50-100 mg total) by mouth every 6 (six) hours as needed for moderate pain or severe pain. 20 tablet 0   No current facility-administered medications on file prior to visit.    BP 137/77 (BP Location: Left Arm, Patient Position: Sitting, Cuff Size: Normal)   Pulse 65   Temp 97.8 F (36.6 C) (Oral)   Resp 16   Ht 6\' 2"  (1.88 m)   Wt 259 lb (117.5 kg)   SpO2 100%   BMI 33.25 kg/m        Objective:   Physical Exam  General Mental Status- Alert. General Appearance- Not in acute distress.   Skin General: Color- Normal Color. Moisture- Normal Moisture.  Neck Carotid Arteries- Normal color. Moisture- Normal Moisture. No carotid bruits. No JVD.  Chest and Lung Exam Auscultation: Breath Sounds:-Normal.  Cardiovascular Auscultation:Rythm- Regular. Murmurs & Other Heart Sounds:Auscultation of the heart reveals- No Murmurs.  Abdomen Inspection:-Inspeection Normal. Palpation/Percussion:Note:No mass. Palpation and Percussion of the abdomen reveal- Non Tender, Non Distended + BS, no rebound or guarding.  Neurologic Cranial Nerve exam:- CN III-XII intact(No nystagmus), symmetric smile. Strength:- 5/5 equal and symmetric strength both upper and lower extremities.       Assessment & Plan:   Assessment and Plan    Patient Instructions  Prostate Cancer Patient expresses fear and anxiety about upcoming PSA results. Reports feeling constantly tired. but no longer experiencing visual disturbances. Symptoms now resolved. -Continue current medications:Venlafaxine, Buspar, and Trazodone. -Encourage patient to reschedule appointment with psychiatrist for further  mental health support. -Review PSA results at upcoming appointment.  I   Fatigue -get cbc, cmp, tsh and b12 level.  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 -Write letter for patient's employer recommending work from home accommodations (3 days at home, 2 days in office).  Insomnia -continue trazadone. limit to dosage  increase due to already on high dose effexor and also on buspar  Urinary Incontinence Patient reports embarrassment due to urinary leakage. -Continue use of Depends. get urologist opininion.  Hydrocele Patient reports no current discomfort from hydrocele. -Recommend patient discuss with urologist at upcoming appointment.  Allergies No current issues reported. -continue xyzal and flonse.   Anxiety and depression -continue with counselor. Please see counselor. Also please establish care with psychiatrist. -continue effexor and buspar. --if thought of harm to self or others be seen in [place] [facility] or [place] [facility]. -urgent care number [phone]  Follow up in 1 month or sooner if needed.   Time spent with patient today was  41 minutes which consisted of chart revdiew, discussing diagnosis, work up, treatment and documentation.

## 2023-07-27 NOTE — Addendum Note (Signed)
Addended by: Gwenevere Abbot on: 07/27/2023 02:16 PM   Modules accepted: Orders, Level of Service

## 2023-07-27 NOTE — Patient Instructions (Addendum)
Prostate Cancer Patient expresses fear and anxiety about upcoming PSA results. Reports feeling constantly tired. but no longer experiencing visual disturbances. Symptoms now resolved. -Continue current medications:Venlafaxine, Buspar, and Trazodone. -Encourage patient to reschedule appointment with psychiatrist for further mental health support. -Review PSA results at upcoming appointment.  I   Fatigue -get cbc, cmp, tsh and b12 level.  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 -Write letter for patient's employer recommending work from home accommodations (3 days at home, 2 days in office).  Insomnia -continue trazadone. limit to dosage increase due to already on high dose effexor and also on buspar  Urinary Incontinence Patient reports embarrassment due to urinary leakage. -Continue use of Depends. get urologist opininion.  Hydrocele Patient reports no current discomfort from hydrocele. -Recommend patient discuss with urologist at upcoming appointment.  Allergies No current issues reported. -continue xyzal and flonse.   Anxiety and depression -continue with counselor. Please see counselor. Also please establish care with psychiatrist. -continue effexor and buspar. --if thought of harm to self or others be seen in [place] [facility] ED or [place] [facility]. -urgent care number [phone]  Follow up in 1 month or sooner if needed.

## 2023-07-28 LAB — CBC WITH DIFFERENTIAL/PLATELET
Basophils Absolute: 0.1 10*3/uL (ref 0.0–0.1)
Basophils Relative: 0.9 % (ref 0.0–3.0)
Eosinophils Absolute: 0.4 10*3/uL (ref 0.0–0.7)
Eosinophils Relative: 7.7 % — ABNORMAL HIGH (ref 0.0–5.0)
HCT: 42.2 % (ref 39.0–52.0)
Hemoglobin: 13.6 g/dL (ref 13.0–17.0)
Lymphocytes Relative: 29.1 % (ref 12.0–46.0)
Lymphs Abs: 1.7 10*3/uL (ref 0.7–4.0)
MCHC: 32.2 g/dL (ref 30.0–36.0)
MCV: 83.8 fL (ref 78.0–100.0)
Monocytes Absolute: 0.5 10*3/uL (ref 0.1–1.0)
Monocytes Relative: 8.1 % (ref 3.0–12.0)
Neutro Abs: 3.2 10*3/uL (ref 1.4–7.7)
Neutrophils Relative %: 54.2 % (ref 43.0–77.0)
Platelets: 402 10*3/uL — ABNORMAL HIGH (ref 150.0–400.0)
RBC: 5.04 Mil/uL (ref 4.22–5.81)
RDW: 15.6 % — ABNORMAL HIGH (ref 11.5–15.5)
WBC: 5.9 10*3/uL (ref 4.0–10.5)

## 2023-08-10 ENCOUNTER — Ambulatory Visit (INDEPENDENT_AMBULATORY_CARE_PROVIDER_SITE_OTHER): Payer: 59 | Admitting: Medical

## 2023-08-10 VITALS — BP 124/70 | HR 73 | Temp 98.0°F | Resp 18 | Ht 74.0 in | Wt 264.4 lb

## 2023-08-10 DIAGNOSIS — Z8546 Personal history of malignant neoplasm of prostate: Secondary | ICD-10-CM | POA: Diagnosis not present

## 2023-08-10 DIAGNOSIS — F32A Depression, unspecified: Secondary | ICD-10-CM

## 2023-08-10 DIAGNOSIS — J320 Chronic maxillary sinusitis: Secondary | ICD-10-CM

## 2023-08-10 DIAGNOSIS — F419 Anxiety disorder, unspecified: Secondary | ICD-10-CM

## 2023-08-10 DIAGNOSIS — N3945 Continuous leakage: Secondary | ICD-10-CM

## 2023-08-10 DIAGNOSIS — J309 Allergic rhinitis, unspecified: Secondary | ICD-10-CM | POA: Diagnosis not present

## 2023-08-10 MED ORDER — TRAZODONE HCL 50 MG PO TABS
25.0000 mg | ORAL_TABLET | Freq: Every evening | ORAL | 3 refills | Status: DC | PRN
Start: 2023-08-10 — End: 2023-11-28

## 2023-08-10 NOTE — Patient Instructions (Signed)
Prostate Cancer PSA slightly elevated, indicating presence of cancer cells. Urologist recommended radiation therapy, but patient is dealing with incontinence issues. -Continue Kegel exercises to improve urinary control before starting radiation therapy.  Depression and Anxiety Patient reports feeling down after receiving news of needing further treatment for prostate cancer, but not as severe as previous episodes. -Continue Effexor and Buspar. -Refill Trazodone prescription.  Allergic Rhinitis Patient reports chronic nasal congestion and yellow mucus, likely due to allergies. -Continue Flonase and Xyzal. -Recommend use of a neti pot for nasal irrigation. -Consider antibiotic therapy if symptoms persist or worsen.  Vitamin B12 Deficiency B12 level on the lower end, which may be contributing to patient's fatigue. -Recommend over-the-counter B12 supplement, 1000 micrograms daily.  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 -Completed FMLA medical accommodation form for patient.   Follow up in one month or sooner if needed.

## 2023-08-10 NOTE — Progress Notes (Unsigned)
Subjective:    Patient ID: Walter Nichols, male    DOB: 1971-07-30, 52 y.o.   MRN: [id number]  HPI   Discussed the use of AI scribe software for clinical note transcription with the patient, who gave verbal consent to proceed.  History of Present Illness   The patient, with a history of prostate cancer, anxiety, and depression, presents with concerns about urinary incontinence and nasal congestion. He reports a recent increase in urinary incontinence, which has led to the use of adult diapers, particularly when at work. The incontinence is described as minor leakage, occurring approximately twice a day. The patient is currently performing Kegel exercises as recommended by his urologist to improve urinary control.  The patient also reports ongoing nasal congestion, which has been present since his last visit. He describes the production of yellow mucus, but denies any associated pain. The congestion has been persistent despite the use of Flonase and Xyzal for allergies.  In addition, the patient has been experiencing fatigue, which he attributes to his work schedule and family stressors. He reports needing one day off per week to rest and recover. On one occasion, he spent an entire day sleeping after preparing for the work week.  Regarding his prostate cancer, the patient recently received news that his prostate-specific antigen (PSA) level was slightly elevated at 0.5, indicating the presence of cancer cells per urologist. His urologist estimates that the patient is 25-30% cured and has recommended radiation therapy. The patient expresses frustration and anxiety about this news, but denies any significant depressive symptoms. He continues to take Effexor, Trazodone, and Buspar for his mental health.        Review of Systems  Constitutional:  Positive for fatigue. Negative for chills.  HENT:  Positive for congestion. Negative for sinus pressure, sinus pain, sneezing and trouble swallowing.    Respiratory:  Negative for cough, chest tightness and wheezing.   Cardiovascular:  Negative for chest pain and palpitations.  Gastrointestinal:  Negative for abdominal pain.  Genitourinary:        Urinary incontinence.  Musculoskeletal:  Negative for back pain and joint swelling.  Skin:  Negative for rash.  Neurological:  Negative for dizziness, seizures, weakness and headaches.  Hematological:  Negative for adenopathy. Does not bruise/bleed easily.  Psychiatric/Behavioral:  Positive for dysphoric mood and sleep disturbance. Negative for behavioral problems, confusion and suicidal ideas. The patient is nervous/anxious.        Improved with meds    Past Medical History:  Diagnosis Date   Alcoholism (HCC)    Anal fissure    Anxiety    Cancer (HCC)    prostate   Depression    Hypertension    Migraine    Sleep apnea      Social History   Socioeconomic History   Marital status: Single    Spouse name: Not on file   Number of children: Not on file   Years of education: Not on file   Highest education level: Not on file  Occupational History   Not on file  Tobacco Use   Smoking status: Every Day    Current packs/day: 0.50    Types: Cigarettes   Smokeless tobacco: Never  Vaping Use   Vaping status: Never Used  Substance and Sexual Activity   Alcohol use: Not Currently    Comment: 40 oz a day/ocassionally    Drug use: Not Currently    Comment: in the past used coccaine. 3 yrs ago.   Sexual  activity: Never  Other Topics Concern   Not on file  Social History Narrative   Not on file   Social Determinants of Health   Financial Resource Strain: Not on file  Food Insecurity: Food Insecurity Present (04/28/2023)   Hunger Vital Sign    Worried About Running Out of Food in the Last Year: Sometimes true    Ran Out of Food in the Last Year: Sometimes true  Transportation Needs: No Transportation Needs (04/28/2023)   PRAPARE - Administrator, Civil Service  (Medical): No    Lack of Transportation (Non-Medical): No  Physical Activity: Not on file  Stress: Not on file  Social Connections: Not on file  Intimate Partner Violence: Not At Risk (04/28/2023)   Humiliation, Afraid, Rape, and Kick questionnaire    Fear of Current or Ex-Partner: No    Emotionally Abused: No    Physically Abused: No    Sexually Abused: No    Past Surgical History:  Procedure Laterality Date   HEMORROIDECTOMY     x2   LYMPHADENECTOMY Bilateral 04/28/2023   Procedure: BILATERAL PELVIC LYMPHADENECTOMY;  Surgeon: Heloise Purpura, MD;  Location: [facility];  Service: Urology;  Laterality: Bilateral;   ROBOT ASSISTED LAPAROSCOPIC RADICAL PROSTATECTOMY N/A 04/28/2023   Procedure: XI ROBOTIC ASSISTED LAPAROSCOPIC RADICAL PROSTATECTOMY LEVEL 2;  Surgeon: Heloise Purpura, MD;  Location: [facility];  Service: Urology;  Laterality: N/A;  210 MINUTES NEEDED FOR CASE    Family History  Problem Relation Age of Onset   Prostate cancer Maternal Uncle 59   Lung cancer Maternal Grandmother 72   Colon cancer Neg Hx    Esophageal cancer Neg Hx    Rectal cancer Neg Hx    Stomach cancer Neg Hx     No Known Allergies  Current Outpatient Medications on File Prior to Visit  Medication Sig Dispense Refill   AMBULATORY NON FORMULARY MEDICATION Nitroglycerin ointment 0.125% apply BID rectally for 6-8 weeks 30 g 0   amLODipine (NORVASC) 10 MG tablet TAKE 1 TABLET BY MOUTH EVERY DAY 90 tablet 0   atorvastatin (LIPITOR) 10 MG tablet TAKE 1 TABLET BY MOUTH EVERY DAY 90 tablet 1   atorvastatin (LIPITOR) 10 MG tablet TAKE 1 TABLET BY MOUTH EVERY DAY 90 tablet 1   benzonatate (TESSALON) 100 MG capsule Take 1 capsule (100 mg total) by mouth 3 (three) times daily as needed for cough. 30 capsule 0   busPIRone (BUSPAR) 15 MG tablet TAKE 1 TABLET BY MOUTH THREE TIMES A DAY 270 tablet 1   cyclobenzaprine (FLEXERIL) 5 MG tablet 1 tab po q hs prn tension ha 7 tablet 0   docusate sodium (COLACE) 100 MG capsule  Take 100 mg by mouth daily.     famotidine (PEPCID) 20 MG tablet TAKE 1 TABLET BY MOUTH EVERY DAY 90 tablet 1   fenofibrate (TRICOR) 48 MG tablet TAKE 1 TABLET BY MOUTH EVERY DAY 90 tablet 0   fluticasone (FLONASE) 50 MCG/ACT nasal spray SPRAY 2 SPRAYS INTO EACH NOSTRIL EVERY DAY 48 mL 1   fluticasone (FLONASE) 50 MCG/ACT nasal spray Place 2 sprays into both nostrils daily. 16 g 1   levocetirizine (XYZAL) 5 MG tablet Take 1 tablet (5 mg total) by mouth every evening. 30 tablet 3   losartan (COZAAR) 25 MG tablet TAKE 1 TABLET (25 MG TOTAL) BY MOUTH DAILY. 90 tablet 2   nicotine (NICODERM CQ - DOSED IN MG/24 HOURS) 21 mg/24hr patch Place 1 patch (21 mg total) onto  the skin daily. 28 patch 0   sildenafil (VIAGRA) 50 MG tablet 1 tab po as needed one hour prior to sex 10 tablet 0   traMADol (ULTRAM) 50 MG tablet Take 1-2 tablets (50-100 mg total) by mouth every 6 (six) hours as needed for moderate pain or severe pain. 20 tablet 0   traZODone (DESYREL) 50 MG tablet TAKE 1/2 TAB AT NIGHT 45 tablet 1   traZODone (DESYREL) 50 MG tablet TAKE 1/2 TO 1 TABLET BY MOUTH AT BEDTIME AS NEEDED FOR SLEEP 90 tablet 2   traZODone (DESYREL) 50 MG tablet Take 0.5-1 tablets (25-50 mg total) by mouth at bedtime as needed for sleep. 30 tablet 3   triamcinolone (NASACORT) 55 MCG/ACT AERO nasal inhaler Place 1 spray into the nose daily as needed (allergies).     venlafaxine XR (EFFEXOR XR) 150 MG 24 hr capsule Take 1 capsule (150 mg total) by mouth daily with breakfast. 30 capsule 11   No current facility-administered medications on file prior to visit.    BP 124/70   Pulse 73   Temp 98 F (36.7 C)   Resp 18   Ht 6\' 2"  (1.88 m)   Wt 264 lb 6.4 oz (119.9 kg)   SpO2 100%   BMI 33.95 kg/m        Objective:   Physical Exam  General Mental Status- Alert. General Appearance- Not in acute distress.   Skin General: Color- Normal Color. Moisture- Normal Moisture.  Neck Carotid Arteries- Normal color. Moisture-  Normal Moisture. No carotid bruits. No JVD.  Chest and Lung Exam Auscultation: Breath Sounds:-Normal.  Cardiovascular Auscultation:Rythm- Regular. Murmurs & Other Heart Sounds:Auscultation of the heart reveals- No Murmurs.  Abdomen Inspection:-Inspeection Normal. Palpation/Percussion:Note:No mass. Palpation and Percussion of the abdomen reveal- Non Tender, Non Distended + BS, no rebound or guarding.   Neurologic Cranial Nerve exam:- CN III-XII intact(No nystagmus), symmetric smile. Strength:- 5/5 equal and symmetric strength both upper and lower extremities.    Heent- no frontal or maxillary sinus pressure presently but sounds very nasal congested. Canals clear and normal tms.     Assessment & Plan:   Assessment and Plan    Prostate Cancer PSA slightly elevated, indicating presence of cancer cells. Urologist recommended radiation therapy, but patient is dealing with incontinence issues. -Continue Kegel exercises to improve urinary control before starting radiation therapy.  Depression and Anxiety Patient reports feeling down after receiving news of needing further treatment for prostate cancer, but not as severe as previous episodes. -Continue Effexor and Buspar. -Refill Trazodone prescription.  Allergic Rhinitis Patient reports chronic nasal congestion and yellow mucus, likely due to allergies. -Continue Flonase and Xyzal. -Recommend use of a neti pot for nasal irrigation. -Consider antibiotic therapy if symptoms persist or worsen.  Vitamin B12 Deficiency B12 level on the lower end, which may be contributing to patient's fatigue. -Recommend over-the-counter B12 supplement, 1000 micrograms daily.  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 -Completed FMLA medical accommodation form for patient.   Follow up in one month or sooner if needed.        Esperanza Richters, PA-C   Time spent with  patient today was 43  minutes which consisted of chart review, discussing diagnosis, work up, treatment and documentation. Also pt brought in moderate complex fmla medication accomation form for which I filled out during the visit.

## 2023-08-16 ENCOUNTER — Telehealth: Payer: Self-pay | Admitting: Medical

## 2023-08-16 NOTE — Telephone Encounter (Signed)
Martie Lee from [facility] [place] called regarding patient accomodation request, she states some things are unclear and needs some clarification. Please call [phone]

## 2023-08-18 NOTE — Telephone Encounter (Signed)
Spoke with Walter Nichols and she stated that she wanted to clarify the end dates. The dates looks like 02/07/23 or 02/03/26?  Please correct and fax back to directly to [phone].

## 2023-08-19 NOTE — Telephone Encounter (Signed)
Paperwork faxed 08/18/23.

## 2023-08-26 ENCOUNTER — Ambulatory Visit: Payer: 59 | Admitting: Medical

## 2023-09-09 ENCOUNTER — Encounter: Payer: Self-pay | Admitting: Medical

## 2023-09-09 ENCOUNTER — Ambulatory Visit (INDEPENDENT_AMBULATORY_CARE_PROVIDER_SITE_OTHER): Payer: 59 | Admitting: Medical

## 2023-09-09 VITALS — BP 130/80 | HR 69 | Temp 98.0°F | Resp 18 | Ht 74.0 in | Wt 258.8 lb

## 2023-09-09 DIAGNOSIS — C61 Malignant neoplasm of prostate: Secondary | ICD-10-CM

## 2023-09-09 DIAGNOSIS — I10 Essential (primary) hypertension: Secondary | ICD-10-CM

## 2023-09-09 DIAGNOSIS — G4700 Insomnia, unspecified: Secondary | ICD-10-CM

## 2023-09-09 DIAGNOSIS — F419 Anxiety disorder, unspecified: Secondary | ICD-10-CM | POA: Diagnosis not present

## 2023-09-09 DIAGNOSIS — F32A Depression, unspecified: Secondary | ICD-10-CM | POA: Diagnosis not present

## 2023-09-09 DIAGNOSIS — F1910 Other psychoactive substance abuse, uncomplicated: Secondary | ICD-10-CM | POA: Diagnosis not present

## 2023-09-09 DIAGNOSIS — E538 Deficiency of other specified B group vitamins: Secondary | ICD-10-CM
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
## 2023-09-15 ENCOUNTER — Telehealth: Payer: Self-pay | Admitting: Medical

## 2023-09-15 NOTE — Telephone Encounter (Signed)
 noted

## 2023-09-15 NOTE — Telephone Encounter (Signed)
 Copied from CRM [id number]. Topic: General - Other >> Sep 13, 2023  3:23 PM Graeme ORN wrote: Reason for CRM: Pt called states he has to take leave from work due to medical concerns. Pt state provider should be receiving paperwork to be complete for his leave. Cn reach out to pt with any questions or concerns. Thank You

## 2023-09-20 ENCOUNTER — Telehealth: Payer: Self-pay

## 2023-09-20 ENCOUNTER — Telehealth: Payer: 59 | Admitting: Medical

## 2023-09-20 NOTE — Telephone Encounter (Signed)
 No show letter

## 2023-09-23 ENCOUNTER — Telehealth: Payer: Self-pay | Admitting: Medical

## 2023-09-23 NOTE — Telephone Encounter (Signed)
 Pt had appointment the other day. He did not show for that video visit. Was to touch base on his prostate CA , depression and anxiety. See how he was and to complete fmla paperwork. While I waited for him to get on the video visit filled out paperwork. Majority complete but except section 1, 2, 3, 8 and my npi number.   If you can get him scheduled for virtual visit week of 10-02-2022. I am not in office next week.

## 2023-09-24 ENCOUNTER — Other Ambulatory Visit: Payer: Self-pay | Admitting: Medical

## 2023-09-26 ENCOUNTER — Telehealth: Payer: Self-pay | Admitting: Medical

## 2023-09-26 NOTE — Telephone Encounter (Signed)
 Copied from CRM [id number]. Topic: Clinical - Medical Advice >> Sep 26, 2023  2:58 PM Thersia BROCKS wrote: Reason for CRM: Patient called in stating that his [facility] company [employer] stated that they will faxed the paperwork over to the provider, stated that he needs the paperwork signed in order to get the process and fax in [facility] and get a delay on the claim.. Stated that he tried to talked to them and is unable to get someone. Patient is requesting a callback  [employer] Davenport number is [phone]

## 2023-09-26 NOTE — Telephone Encounter (Signed)
 Pt has an appt on 10/10/23

## 2023-09-28 NOTE — Telephone Encounter (Signed)
 PCP has FMLA paperwork and is out of office

## 2023-10-03 ENCOUNTER — Telehealth: Payer: Self-pay | Admitting: Medical

## 2023-10-03 NOTE — Telephone Encounter (Signed)
Copied from CRM [id number]. Topic: General - Other >> Oct 03, 2023  3:03 PM Corin V wrote: Reason for CRM: Patient is wanting to verify if the FMLA paperwork was sent in today as it is due back by tomorrow. Please verify if paperwork has been signed and sent back to [facility] and let patient know.

## 2023-10-03 NOTE — Telephone Encounter (Signed)
[employer] sent over form for last OV , faxed today .Marland Kitchen FMLA still pending

## 2023-10-03 NOTE — Telephone Encounter (Signed)
Please contact pt as 3-4 questions on form I filled out need to be answered and clarified by pt. He missed his appointment/video visit. I did best to fill out that day but had those last questions pending until get feedback from pt.

## 2023-10-03 NOTE — Telephone Encounter (Signed)
Pt had appointment the other day. He did not show for that video visit. Was to touch base on his prostate CA , depression and anxiety. See how he was and to complete fmla paperwork. While I waited for him to get on the video visit filled out paperwork. Majority complete but except section 1, 2, 3, 8 and my npi number.    If you can get him scheduled for virtual visit week of 10-02-2022. I am not in office next week.  Form mostly done.  But need answers to the section and fill out the rest. If you would get those answers

## 2023-10-03 NOTE — Telephone Encounter (Signed)
Pt called and phone rang , unable to leave voicemail

## 2023-10-03 NOTE — Telephone Encounter (Signed)
Another phone note created please refer to that one

## 2023-10-04 ENCOUNTER — Telehealth: Payer: 59 | Admitting: Medical

## 2023-10-04 DIAGNOSIS — C61 Malignant neoplasm of prostate: Secondary | ICD-10-CM | POA: Diagnosis not present

## 2023-10-04 DIAGNOSIS — F419 Anxiety disorder, unspecified: Secondary | ICD-10-CM

## 2023-10-04 DIAGNOSIS — F32A Depression, unspecified: Secondary | ICD-10-CM

## 2023-10-04 NOTE — Progress Notes (Signed)
   Subjective:    Patient ID: Walter Nichols, male    DOB: 12-Sep-1971, 53 y.o.   MRN: [id number]  HPI  Virtual Visit via Video Note  I connected with Clennie Sunder on 10/04/23 at  4:00 PM EST by a video enabled telemedicine application and verified that I am speaking with the correct person using two identifiers.  Location: Patient: home Provider: office   I discussed the limitations of evaluation and management by telemedicine and the availability of in person appointments. The patient expressed understanding and agreed to proceed.  History of Present Illness:  Pt is still depressed and anxious. Pt agrees will establish to see psychiatrist and his work wants him to [employer] care with one as well. He acknowledges today will be beneficial. Pt is still on Venlafaxine 150mg , Trazodone 50mg , and Buspar 15mg . No homicidal or suicidal ideation reported today.   Have been trying to get him in with behavioral health in past various times.  Pt did call and made appointment. He missed appointment with psychiatry and needs to call and get rescheduled.  Still having family stressors. -Reports significant stress related to family dynamics and financial difficulties.     Pt has prostate cancer and he needs to have radiation therapy once his urinary incontinence resolves.      Observations/Objective: General-no acute distress, pleasant, oriented. Lungs- on inspection lungs appear unlabored. Neck- no tracheal deviation or jvd on inspection. Neuro- gross motor function appears intact.   Assessment and Plan: Prostate Cancer Patient has completed surgery and is preparing for radiation therapy. Expressed anxiety about the upcoming treatment and its potential side effects. -Continue current treatment plan and prepare for radiation therapy.  Depression and Anxiety Patient reports ongoing feelings of isolation, disrupted sleep patterns, and lack of motivation. He has missed a scheduled psychiatry  appointment and has not rescheduled. -Continue current medications: Venlafaxine, Trazodone, and Buspar. -Encourage patient to reschedule missed psychiatry appointment.  .Follow-up Patient has an appointment with the surgeon in February. -Continue to monitor patient's progress and mental health status. -follow up with me in 2-3 weeks if has not seen psychiatrist by then -send in fmla forms to his work today.  Follow Up Instructions:    I discussed the assessment and treatment plan with the patient. The patient was provided an opportunity to ask questions and all were answered. The patient agreed with the plan and demonstrated an understanding of the instructions.   The patient was advised to call back or seek an in-person evaluation if the symptoms worsen or if the condition fails to improve as anticipated.     Esperanza Richters, PA-C   Review of Systems     Objective:   Physical Exam        Assessment & Plan:

## 2023-10-04 NOTE — Telephone Encounter (Signed)
Pt called and phone rang , unable to leave voicemail

## 2023-10-04 NOTE — Patient Instructions (Signed)
Prostate Cancer Patient has completed surgery and is preparing for radiation therapy. Expressed anxiety about the upcoming treatment and its potential side effects. -Continue current treatment plan and prepare for radiation therapy.  Depression and Anxiety Patient reports ongoing feelings of isolation, disrupted sleep patterns, and lack of motivation. He has missed a scheduled psychiatry appointment and has not rescheduled. -Continue current medications: Venlafaxine, Trazodone, and Buspar. -Encourage patient to reschedule missed psychiatry appointment.  .Follow-up Patient has an appointment with the surgeon in February. -Continue to monitor patient's progress and mental health status. -follow up with me in 2-3 weeks if has not seen psychiatrist by then -send in fmla forms to his work today.

## 2023-10-10 ENCOUNTER — Ambulatory Visit: Payer: 59 | Admitting: Medical

## 2023-10-10 ENCOUNTER — Other Ambulatory Visit: Payer: Self-pay | Admitting: Medical

## 2023-10-19 ENCOUNTER — Other Ambulatory Visit: Payer: Self-pay | Admitting: Medical

## 2023-10-24 ENCOUNTER — Encounter: Payer: Self-pay | Admitting: Medical

## 2023-10-24 ENCOUNTER — Ambulatory Visit (INDEPENDENT_AMBULATORY_CARE_PROVIDER_SITE_OTHER): Payer: 59 | Admitting: Medical

## 2023-10-24 ENCOUNTER — Ambulatory Visit (HOSPITAL_BASED_OUTPATIENT_CLINIC_OR_DEPARTMENT_OTHER)
Admission: RE | Admit: 2023-10-24 | Discharge: 2023-10-24 | Disposition: A | Discharge status: Home / Self Care | Payer: 59 | Source: Ambulatory Visit | Attending: Medical | Admitting: Medical

## 2023-10-24 VITALS — BP 120/70 | HR 56 | Temp 97.9°F | Resp 16 | Wt 259.8 lb

## 2023-10-24 DIAGNOSIS — M6740 Ganglion, unspecified site: Secondary | ICD-10-CM | POA: Diagnosis not present

## 2023-10-24 DIAGNOSIS — T148XXA Other injury of unspecified body region, initial encounter: Secondary | ICD-10-CM

## 2023-10-24 DIAGNOSIS — M25562 Pain in left knee: Secondary | ICD-10-CM | POA: Diagnosis present

## 2023-10-24 DIAGNOSIS — C61 Malignant neoplasm of prostate: Secondary | ICD-10-CM

## 2023-10-24 DIAGNOSIS — F419 Anxiety disorder, unspecified: Secondary | ICD-10-CM

## 2023-10-24 DIAGNOSIS — F32A Depression, unspecified: Secondary | ICD-10-CM

## 2023-10-24 MED ORDER — TRAZODONE HCL 50 MG PO TABS: 25.0000 mg | ORAL_TABLET | Freq: Every evening | ORAL | 3 refills | Status: DC | PRN

## 2023-10-24 NOTE — Addendum Note (Signed)
 Addended by: Serafina Damme on: 10/24/2023 08:54 PM   Modules accepted: Orders

## 2023-10-24 NOTE — Progress Notes (Signed)
 Subjective:    Patient ID: Walter Nichols, male    DOB: [birth year], 53 y.o.   MRN: [id number]  HPI  Discussed the use of AI scribe software for clinical note transcription with the patient, who gave verbal consent to proceed.  History of Present Illness   Walter Nichols is a 53 year old male with prostate cancer who presents with left hand swelling and knee pain.  He has been experiencing swelling and a lump on the left hand, specifically on the dorsal aspect at the base of the hand/wrist, since early January after shoveling snow. He attributes this to repetitive motion. The swelling has been intermittent, with fluctuations in size over the past month. The area is sore but not acutely painful. Noticed first after shoveling snow.  He also has left knee pain, which is more pronounced than the right knee. The left knee appears slightly swollen compared to the right. He notes a period of inactivity following his cancer diagnosis. He believes inactivity contributed to the anterior left  knee pain. The pain has been present intermittently since then.  He mentions bruising on his right foot and ankle, which he noticed recently without any known trauma Walter cause. No other bruising Walter epistaxis are reported.  He has a history of prostate cancer and is currently dealing with urinary incontinence, which has improved significantly. He only uses pads when going out and has been performing exercises to manage the condition.    He is on venlafaxine , trazodone , and Buspar  for anxiety, depression, and insomnia.   He has been inactive recently, staying mostly at home and not engaging in physical activity. He has lost approximately 22-23 pounds due to muscle loss from inactivity. Formerly ate more and lifted weights heavily. Now eating less and not working out at all.            Review of Systems  Constitutional:  Negative for chills, fatigue and fever.  Respiratory:  Negative for cough, chest tightness and  wheezing.   Cardiovascular:  Negative for chest pain and palpitations.  Gastrointestinal:  Negative for abdominal pain, constipation and vomiting.  Musculoskeletal:  Negative for back pain.       Left knee pain  Skin:  Negative for rash.  Neurological:  Negative for dizziness, seizures, weakness and headaches.  Hematological:  Negative for adenopathy. Does not bruise/bleed easily.  Psychiatric/Behavioral:  Positive for dysphoric mood. Negative for behavioral problems, decreased concentration and suicidal ideas. The patient is nervous/anxious. The patient is not hyperactive.      Past Medical History:  Diagnosis Date   Alcoholism (HCC)    Anal fissure    Anxiety    Cancer (HCC)    prostate   Depression    Hypertension    Migraine    Sleep apnea      Social History   Socioeconomic History   Marital status: Single    Spouse name: Not on file   Number of children: Not on file   Years of education: Not on file   Highest education level: Not on file  Occupational History   Not on file  Tobacco Use   Smoking status: Every Day    Current packs/day: 0.50    Types: Cigarettes   Smokeless tobacco: Never  Vaping Use   Vaping status: Never Used  Substance and Sexual Activity   Alcohol use: Not Currently    Comment: 40 oz a day/ocassionally    Drug use: Not Currently    Comment: in  the past used coccaine. 3 yrs ago.   Sexual activity: Never  Other Topics Concern   Not on file  Social History Narrative   Not on file   Social Drivers of Health   Financial Resource Strain: Not on file  Food Insecurity: Food Insecurity Present (04/28/2023)   Hunger Vital Sign    Worried About Running Out of Food in the Last Year: Sometimes true    Ran Out of Food in the Last Year: Sometimes true  Transportation Needs: No Transportation Needs (04/28/2023)   PRAPARE - Administrator, Civil Service (Medical): No    Lack of Transportation (Non-Medical): No  Physical Activity: Not on  file  Stress: Not on file  Social Connections: Not on file  Intimate Partner Violence: Not At Risk (04/28/2023)   Humiliation, Afraid, Rape, and Kick questionnaire    Fear of Current Walter Ex-Partner: No    Emotionally Abused: No    Physically Abused: No    Sexually Abused: No    Past Surgical History:  Procedure Laterality Date   HEMORROIDECTOMY     x2   LYMPHADENECTOMY Bilateral 04/28/2023   Procedure: BILATERAL PELVIC LYMPHADENECTOMY;  Surgeon: Florencio Hunting, MD;  Location: [facility];  Service: Urology;  Laterality: Bilateral;   ROBOT ASSISTED LAPAROSCOPIC RADICAL PROSTATECTOMY N/A 04/28/2023   Procedure: XI ROBOTIC ASSISTED LAPAROSCOPIC RADICAL PROSTATECTOMY LEVEL 2;  Surgeon: Florencio Hunting, MD;  Location: [facility];  Service: Urology;  Laterality: N/A;  210 MINUTES NEEDED FOR CASE    Family History  Problem Relation Age of Onset   Prostate cancer Maternal Uncle 59   Lung cancer Maternal Grandmother 72   Colon cancer Neg Hx    Esophageal cancer Neg Hx    Rectal cancer Neg Hx    Stomach cancer Neg Hx     No Known Allergies  Current Outpatient Medications on File Prior to Visit  Medication Sig Dispense Refill   AMBULATORY NON FORMULARY MEDICATION Nitroglycerin ointment 0.125% apply BID rectally for 6-8 weeks 30 g 0   amLODipine  (NORVASC ) 10 MG tablet TAKE 1 TABLET BY MOUTH EVERY DAY 90 tablet 0   atorvastatin  (LIPITOR) 10 MG tablet TAKE 1 TABLET BY MOUTH EVERY DAY 90 tablet 1   atorvastatin  (LIPITOR) 10 MG tablet TAKE 1 TABLET BY MOUTH EVERY DAY 90 tablet 1   benzonatate  (TESSALON ) 100 MG capsule Take 1 capsule (100 mg total) by mouth 3 (three) times daily as needed for cough. 30 capsule 0   busPIRone  (BUSPAR ) 15 MG tablet TAKE 1 TABLET BY MOUTH THREE TIMES A DAY 270 tablet 1   cyclobenzaprine  (FLEXERIL ) 5 MG tablet 1 tab po q hs prn tension ha 7 tablet 0   docusate sodium  (COLACE) 100 MG capsule Take 100 mg by mouth daily.     famotidine  (PEPCID ) 20 MG tablet TAKE 1 TABLET BY  MOUTH EVERY DAY 90 tablet 1   fenofibrate  (TRICOR ) 48 MG tablet TAKE 1 TABLET BY MOUTH EVERY DAY 90 tablet 0   fluticasone  (FLONASE ) 50 MCG/ACT nasal spray SPRAY 2 SPRAYS INTO EACH NOSTRIL EVERY DAY 48 mL 1   fluticasone  (FLONASE ) 50 MCG/ACT nasal spray Place 2 sprays into both nostrils daily. 16 g 1   levocetirizine (XYZAL ) 5 MG tablet TAKE 1 TABLET BY MOUTH EVERY DAY IN THE EVENING 90 tablet 1   losartan  (COZAAR ) 25 MG tablet TAKE 1 TABLET (25 MG TOTAL) BY MOUTH DAILY. 90 tablet 2   nicotine  (NICODERM CQ  - DOSED IN MG/24 HOURS)  21 mg/24hr patch Place 1 patch (21 mg total) onto the skin daily. 28 patch 0   sildenafil  (VIAGRA ) 50 MG tablet 1 tab po as needed one hour prior to sex 10 tablet 0   traZODone  (DESYREL ) 50 MG tablet TAKE 1/2 TAB AT NIGHT 45 tablet 1   traZODone  (DESYREL ) 50 MG tablet TAKE 1/2 TO 1 TABLET BY MOUTH AT BEDTIME AS NEEDED FOR SLEEP 90 tablet 2   traZODone  (DESYREL ) 50 MG tablet Take 0.5-1 tablets (25-50 mg total) by mouth at bedtime as needed for sleep. 30 tablet 3   traZODone  (DESYREL ) 50 MG tablet Take 0.5-1 tablets (25-50 mg total) by mouth at bedtime as needed for sleep. 30 tablet 3   triamcinolone (NASACORT) 55 MCG/ACT AERO nasal inhaler Place 1 spray into the nose daily as needed (allergies).     venlafaxine  XR (EFFEXOR  XR) 150 MG 24 hr capsule Take 1 capsule (150 mg total) by mouth daily with breakfast. 30 capsule 11   traMADol  (ULTRAM ) 50 MG tablet Take 1-2 tablets (50-100 mg total) by mouth every 6 (six) hours as needed for moderate pain Walter severe pain. 20 tablet 0   No current facility-administered medications on file prior to visit.    BP 120/70   Pulse (!) 56   Temp 97.9 F (36.6 C) (Oral)   Resp 16   Wt 259 lb 12.8 oz (117.8 kg)   SpO2 99%   BMI 33.36 kg/m          Objective:   Physical Exam  General Mental Status- Alert. General Appearance- Not in acute distress.   Skin To small bruised are of rt foot. Nontender to palpation.  Neck  No  JVD.  Chest and Lung Exam Auscultation: Breath Sounds:-CTA  Cardiovascular Auscultation:Rythm- RRR Murmurs & Other Heart Sounds:Auscultation of the heart reveals- No Murmurs.  Abdomen Inspection:-Inspeection Normal. Palpation/Percussion:Note:No mass. Palpation and Percussion of the abdomen reveal- Non Tender, Non Distended + BS, no rebound Walter guarding.   Neurologic Cranial Nerve exam:- CN III-XII intact(No nystagmus), symmetric smile. Strength:- 5/5 equal and symmetric strength both upper and lower extremities.   Left hand- dorsal aspect at base of thumb appears to have moderate sized ganglion cyst.   Left knee- no obvious swelling. No crepitus on range of motion.    Assessment & Plan:   Assessment and Plan    Left Hand Swelling Likely ganglion cyst due to repetitive strenuous activity, with fluctuating size over the past month. No acute pain, only mild soreness. -Referral to Dr. Huel Madison, a hand specialist, for further evaluation and treatment options.  Left Knee Pain Chronic on and off pain, with recent increase in severity and swelling. No specific injury reported. -Order left knee X-ray to evaluate for possible osteoarthritis Walter other pathology.  Right Foot Bruising Unexplained bruising on right foot and ankle, no associated trauma reported. -Order CBC to evaluate platelet count and blood volume.  Prostate Cancer Patient is scheduled to see urologist for follow-up and is considering radiation therapy. Improved urinary incontinence, now only requiring pads when going out. -Continue current medications including venlafaxine , trazodone , and Buspar . -Refill trazodone  prescription.  Depression/Anxiety Chronic feelings of depression and anxiety, exacerbated by recent prostate cancer diagnosis and ongoing treatment. Currently on triple therapy (venlafaxine , trazodone , Buspar ) and has missed psychiatry appointments. -Encourage patient to reschedule missed psychiatry appointment  for further management of depression and anxiety.  Follow-up Pending results of lab work and imaging studies.        Jahmya Onofrio, PA-C

## 2023-10-24 NOTE — Patient Instructions (Signed)
 Left Hand Swelling(at base of thumb) Likely ganglion cyst due to repetitive strenuous activity, with fluctuating size over the past month. No acute pain, only mild soreness. -Referral to Dr. Huel Madison, a hand specialist, for further evaluation and treatment options.  Left Knee Pain Chronic on and off pain, with recent increase in severity and swelling. No specific injury reported. -Order left knee X-ray to evaluate for possible osteoarthritis or other pathology.  Right Foot Bruising Unexplained bruising on right foot and ankle, no associated trauma reported. -Order CBC to evaluate platelet count and blood volume.  Prostate Cancer Patient is scheduled to see urologist for follow-up and is considering radiation therapy. Improved urinary incontinence, now only requiring pads when going out. -Continue current medications including venlafaxine , trazodone , and Buspar . -Refill trazodone  prescription.  Depression/Anxiety Chronic feelings of depression and anxiety, exacerbated by recent prostate cancer diagnosis and ongoing treatment. Currently on triple therapy (venlafaxine , trazodone , Buspar ) and has missed psychiatry appointments. -Encourage patient to reschedule missed psychiatry appointment for further management of depression and anxiety.  Follow-up Pending results of lab work and imaging studies.

## 2023-10-25 ENCOUNTER — Encounter: Payer: Self-pay | Admitting: Medical

## 2023-10-25 LAB — CBC WITH DIFFERENTIAL/PLATELET
Basophils Absolute: 0.1 10*3/uL (ref 0.0–0.1)
Basophils Relative: 1 % (ref 0.0–3.0)
Eosinophils Absolute: 0.3 10*3/uL (ref 0.0–0.7)
Eosinophils Relative: 4 % (ref 0.0–5.0)
HCT: 39.6 % (ref 39.0–52.0)
Hemoglobin: 13 g/dL (ref 13.0–17.0)
Lymphocytes Relative: 30.1 % (ref 12.0–46.0)
Lymphs Abs: 2 10*3/uL (ref 0.7–4.0)
MCHC: 32.8 g/dL (ref 30.0–36.0)
MCV: 84.1 fL (ref 78.0–100.0)
Monocytes Absolute: 0.6 10*3/uL (ref 0.1–1.0)
Monocytes Relative: 9.1 % (ref 3.0–12.0)
Neutro Abs: 3.8 10*3/uL (ref 1.4–7.7)
Neutrophils Relative %: 55.8 % (ref 43.0–77.0)
Platelets: 369 10*3/uL (ref 150.0–400.0)
RBC: 4.7 Mil/uL (ref 4.22–5.81)
RDW: 16 % — ABNORMAL HIGH (ref 11.5–15.5)
WBC: 6.8 10*3/uL (ref 4.0–10.5)

## 2023-10-28 ENCOUNTER — Telehealth: Payer: Self-pay | Admitting: Radiation Oncology

## 2023-10-28 NOTE — Telephone Encounter (Signed)
Unable to LVM to schedule CON with Dr. Kathrynn Running.

## 2023-10-31 ENCOUNTER — Encounter: Payer: Self-pay | Admitting: Medical

## 2023-11-01 ENCOUNTER — Telehealth: Payer: Self-pay | Admitting: Radiation Oncology

## 2023-11-01 NOTE — Telephone Encounter (Signed)
 Unable to LVM to schedule CON with Dr. Kathrynn Running.

## 2023-11-02 ENCOUNTER — Telehealth: Payer: Self-pay | Admitting: Radiation Oncology

## 2023-11-02 NOTE — Telephone Encounter (Signed)
 Unable to LVM to schedule CON with Dr. Kathrynn Running.

## 2023-11-03 ENCOUNTER — Telehealth: Payer: Self-pay | Admitting: Radiation Oncology

## 2023-11-03 NOTE — Telephone Encounter (Signed)
 Unable to LVM to schedule CON with Dr. Kathrynn Running.  Sent unable to contact letter to listed residence 11/03/23.

## 2023-11-10 ENCOUNTER — Ambulatory Visit: Payer: 59 | Admitting: Orthopaedic Surgery

## 2023-11-10 ENCOUNTER — Telehealth: Payer: Self-pay | Admitting: Emergency Medicine

## 2023-11-10 NOTE — Telephone Encounter (Signed)
 Copied from CRM [id number]. Topic: Clinical - Medication Question >> Nov 10, 2023  4:28 PM Martinique E wrote: Reason for CRM: Patient called in stating that he is currently experiencing some tingling pain in his right big toe, possible gout. Patient denied an appointment and was wondering if Dr. Alvira Monday could send in the prescription that he usually gets for gout.

## 2023-11-10 NOTE — Progress Notes (Deleted)
   Office Visit Note   Patient: Walter Nichols           Date of Birth: 11-12-1970           MRN: [id number] Visit Date: 11/10/2023              Requested by:  Esperanza Richters, PA-C [id number] [place],  [place] [address]  PCP: Esperanza Richters, PA-C   Assessment & Plan: Visit Diagnoses: No diagnosis found.  Plan: ***  Follow-Up Instructions: No follow-ups on file.   Orders:  No orders of the defined types were placed in this encounter. No orders of the defined types were placed in this encounter.    Procedures: No procedures performed   Clinical Data: No additional findings.   Subjective: No chief complaint on file.  HPI  Review of Systems   Objective: Vital Signs: There were no vitals taken for this visit.  Physical Exam  Ortho Exam  Specialty Comments:  No specialty comments available.  Imaging: No results found.   PMFS History: Patient Active Problem List   Diagnosis Date Noted  . Genetic testing 04/19/2023  . Prostate cancer (HCC) 04/11/2023  . Strain of lumbar region 05/03/2022  . Olecranon bursitis of right elbow 10/29/2021  . Wellness examination 01/28/2016   Past Medical History:  Diagnosis Date  . Alcoholism (HCC)   . Anal fissure   . Anxiety   . Cancer [facility])    prostate  . Depression   . Hypertension   . Migraine   . Sleep apnea     Family History  Problem Relation Age of Onset  . Prostate cancer Maternal Uncle 59  . Lung cancer Maternal Grandmother 72  . Colon cancer Neg Hx   . Esophageal cancer Neg Hx   . Rectal cancer Neg Hx   . Stomach cancer Neg Hx     Past Surgical History:  Procedure Laterality Date  . HEMORROIDECTOMY     x2  . LYMPHADENECTOMY Bilateral 04/28/2023   Procedure: BILATERAL PELVIC LYMPHADENECTOMY;  Surgeon: Heloise Purpura, MD;  Location: [facility];  Service: Urology;  Laterality: Bilateral;  . ROBOT ASSISTED LAPAROSCOPIC RADICAL PROSTATECTOMY N/A 04/28/2023   Procedure: XI ROBOTIC ASSISTED  LAPAROSCOPIC RADICAL PROSTATECTOMY LEVEL 2;  Surgeon: Heloise Purpura, MD;  Location: [facility];  Service: Urology;  Laterality: N/A;  210 MINUTES NEEDED FOR CASE   Social History   Occupational History  . Not on file  Tobacco Use  . Smoking status: Every Day    Current packs/day: 0.50    Types: Cigarettes  . Smokeless tobacco: Never  Vaping Use  . Vaping status: Never Used  Substance and Sexual Activity  . Alcohol use: Not Currently    Comment: 40 oz a day/ocassionally   . Drug use: Not Currently    Comment: in the past used coccaine. 3 yrs ago.  Marland Kitchen Sexual activity: Never

## 2023-11-11 NOTE — Telephone Encounter (Signed)
 Pt.notified

## 2023-11-21 ENCOUNTER — Telehealth: Payer: Self-pay | Admitting: Radiation Oncology

## 2023-11-21 NOTE — Telephone Encounter (Signed)
 Unable to LVM to schedule CON with Dr. Kathrynn Running.

## 2023-11-28 NOTE — Progress Notes (Signed)
 GU Location of Tumor / Histology: Prostate Ca lymph node+  If Prostate Cancer, Gleason Score is (4 + 3) and PSA is (0.037 on 10/19/2023)  Radical Prostatectomy 04/28/2023  PSA 0.050 on 07/26/2023 PSA  9.03 on 10/27/2022  Walter Nichols presented as referral from Dr. Heloise Purpura [facility]) elevated PSA.  Biopsies     02/25/2023 Dr. Modena Slater NM PET (PSMA) Skull to Mid Thigh CLINICAL DATA:  Prostate cancer. New diagnosis.   IMPRESSION: 1. Tracer avid paramidline primary or primaries, base to apex. 2. No evidence of tracer avid metastatic disease. 3. Incidental findings, including: Sinus disease. Aortic Atherosclerosis (ICD10-I70.0).    12/20/2022 Dr. Modena Slater MR Prostate with/without Contrast CLINICAL DATA:  Elevated PSA. PSA equal 7.48. No biopsy.   IMPRESSION: 1. Lesion in the posterior peripheral zone which spans the LEFT and RIGHT mid gland is indeterminate for high-grade carcinoma. PI-RADS: 3. (Dynacad 3D post processing performed). ROI #1. 2. Minimal enlargement of the transitional zone without suspicious imaging findings. PI-RADS: 1    Past/Anticipated interventions by urology, if any:  Dr. Heloise Purpura   Past/Anticipated interventions by medical oncology, if any: NA  Weight changes, if any:  Yes, weight loss 20-25 lbs in 1 year.  IPSS:  11 SHIM:  3  Bowel/Bladder complaints, if any:  No  Nausea/Vomiting, if any:  No  Pain issues, if any:  0/10  SAFETY ISSUES: Prior radiation?  No Pacemaker/ICD? No Possible current pregnancy? Male  Is the patient on methotrexate? No  Current Complaints / other details:  Stress related constipation with bleeding from rectum.

## 2023-12-05 NOTE — Progress Notes (Incomplete)
 Radiation Oncology         (336) [phone] ________________________________  Initial Outpatient Consultation  Name: Ashley Montminy MRN: [id number]  Date: 12/06/2023  DOB: [birth year]  WU:[id number], Franco Nones, MD   REFERRING PHYSICIAN: Heloise Purpura, MD  DIAGNOSIS: 53 y.o. gentleman with locally advanced prostate cancer and adverse pathology with PSA 0.037 s/p RALP 04/2023 for Stage pT3bN1, Gleason 4+5 prostate cancer    ICD-10-CM   1. Malignant neoplasm of prostate (HCC)  C61     2. Prostate cancer (HCC)  C61       HISTORY OF PRESENT ILLNESS: Kole Hilyard is a 53 y.o. male with a diagnosis of high risk node positive prostate cancer with detectable post-prostatectomy PSA. He was initially referred to Dr. Alvester Morin for an elevated PSA of 9.03. He was subsequently diagnosed with Gleason 4+3 prostate cancer on 01/26/23. Staging PSMA PET scan on 02/25/23 showed no evidence of disease outside of the prostate. He opted to proceed with RALP on 04/28/23 under Dr. Laverle Patter. Pathology revealed Gleason 4+5 prostatic adenocarcinoma with extraprostatic extension at bilateral posterior from mid to base, bilateral seminal vesicle invasion, and a focally positive margin at right apex. Additionally, 2 of the 11 biopsied lymph nodes were positive for metastatic carcinoma, one of which showed extra nodal extension. His postoperative PSA was barely detectable at 0.050, and repeat on 10/19/23 showed stability at 0.037.  The patient reviewed the pathology and PSA results with his urologist and he has kindly been referred today for discussion of potential radiation treatment options.   PREVIOUS RADIATION THERAPY: No  PAST MEDICAL HISTORY:  Past Medical History:  Diagnosis Date   Alcoholism (HCC)    Anal fissure    Anxiety    Cancer (HCC)    prostate   Depression    Hypertension    Migraine    Sleep apnea       PAST SURGICAL HISTORY: Past Surgical History:  Procedure Laterality Date    HEMORROIDECTOMY     x2   LYMPHADENECTOMY Bilateral 04/28/2023   Procedure: BILATERAL PELVIC LYMPHADENECTOMY;  Surgeon: Heloise Purpura, MD;  Location: [facility];  Service: Urology;  Laterality: Bilateral;   ROBOT ASSISTED LAPAROSCOPIC RADICAL PROSTATECTOMY N/A 04/28/2023   Procedure: XI ROBOTIC ASSISTED LAPAROSCOPIC RADICAL PROSTATECTOMY LEVEL 2;  Surgeon: Heloise Purpura, MD;  Location: [facility];  Service: Urology;  Laterality: N/A;  210 MINUTES NEEDED FOR CASE    FAMILY HISTORY:  Family History  Problem Relation Age of Onset   Prostate cancer Maternal Uncle 83   Lung cancer Maternal Grandmother 72   Colon cancer Neg Hx    Esophageal cancer Neg Hx    Rectal cancer Neg Hx    Stomach cancer Neg Hx     SOCIAL HISTORY:  Social History   Socioeconomic History   Marital status: Single    Spouse name: Not on file   Number of children: Not on file   Years of education: Not on file   Highest education level: Not on file  Occupational History   Not on file  Tobacco Use   Smoking status: Every Day    Current packs/day: 0.50    Types: Cigarettes   Smokeless tobacco: Never  Vaping Use   Vaping status: Never Used  Substance and Sexual Activity   Alcohol use: Not Currently    Comment: 40 oz a day/ocassionally    Drug use: Not Currently    Comment: in the past used coccaine. 3 yrs ago.   Sexual  activity: Never  Other Topics Concern   Not on file  Social History Narrative   Not on file   Social Drivers of Health   Financial Resource Strain: Not on file  Food Insecurity: Food Insecurity Present (12/06/2023)   Hunger Vital Sign    Worried About Running Out of Food in the Last Year: Often true    Ran Out of Food in the Last Year: Often true  Transportation Needs: Unmet Transportation Needs (12/06/2023)   PRAPARE - Administrator, Civil Service (Medical): Yes    Lack of Transportation (Non-Medical): Yes  Physical Activity: Not on file  Stress: Not on file  Social Connections:  Not on file  Intimate Partner Violence: Not At Risk (12/06/2023)   Humiliation, Afraid, Rape, and Kick questionnaire    Fear of Current or Ex-Partner: No    Emotionally Abused: No    Physically Abused: No    Sexually Abused: No    ALLERGIES: Patient has no known allergies.  MEDICATIONS:  Current Outpatient Medications  Medication Sig Dispense Refill   buPROPion (WELLBUTRIN XL) 150 MG 24 hr tablet Take by mouth.     AMBULATORY NON FORMULARY MEDICATION Nitroglycerin ointment 0.125% apply BID rectally for 6-8 weeks 30 g 0   amLODipine (NORVASC) 10 MG tablet TAKE 1 TABLET BY MOUTH EVERY DAY 90 tablet 0   atorvastatin (LIPITOR) 10 MG tablet TAKE 1 TABLET BY MOUTH EVERY DAY 90 tablet 1   busPIRone (BUSPAR) 15 MG tablet TAKE 1 TABLET BY MOUTH THREE TIMES A DAY 270 tablet 1   cyclobenzaprine (FLEXERIL) 5 MG tablet 1 tab po q hs prn tension ha 7 tablet 0   docusate sodium (COLACE) 100 MG capsule Take 100 mg by mouth daily.     famotidine (PEPCID) 20 MG tablet TAKE 1 TABLET BY MOUTH EVERY DAY 90 tablet 1   fenofibrate (TRICOR) 48 MG tablet TAKE 1 TABLET BY MOUTH EVERY DAY 90 tablet 0   fluticasone (FLONASE) 50 MCG/ACT nasal spray Place 2 sprays into both nostrils daily. 16 g 1   levocetirizine (XYZAL) 5 MG tablet TAKE 1 TABLET BY MOUTH EVERY DAY IN THE EVENING 90 tablet 1   losartan (COZAAR) 25 MG tablet TAKE 1 TABLET (25 MG TOTAL) BY MOUTH DAILY. 90 tablet 2   nicotine (NICODERM CQ - DOSED IN MG/24 HOURS) 21 mg/24hr patch Place 1 patch (21 mg total) onto the skin daily. 28 patch 0   sildenafil (VIAGRA) 50 MG tablet 1 tab po as needed one hour prior to sex 10 tablet 0   traZODone (DESYREL) 50 MG tablet Take 0.5-1 tablets (25-50 mg total) by mouth at bedtime as needed for sleep. 30 tablet 3   triamcinolone (NASACORT) 55 MCG/ACT AERO nasal inhaler Place 1 spray into the nose daily as needed (allergies).     venlafaxine XR (EFFEXOR XR) 150 MG 24 hr capsule Take 1 capsule (150 mg total) by mouth daily  with breakfast. 30 capsule 11   No current facility-administered medications for this encounter.    REVIEW OF SYSTEMS:  On review of systems, the patient reports that he is doing well overall. He denies any chest pain, shortness of breath, cough, fevers, chills, night sweats, unintended weight changes. He denies any bowel disturbances, and denies abdominal pain, nausea or vomiting. He denies any new musculoskeletal or joint aches or pains. His IPSS was 11, indicating mild-moderate urinary symptoms. 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 A complete review  of systems is obtained and is otherwise negative.    PHYSICAL EXAM:  Wt Readings from Last 3 Encounters:  12/06/23 257 lb 3.2 oz (116.7 kg)  10/24/23 259 lb 12.8 oz (117.8 kg)  09/09/23 258 lb 12.8 oz (117.4 kg)   Temp Readings from Last 3 Encounters:  12/06/23 97.7 F (36.5 C)  10/24/23 97.9 F (36.6 C) (Oral)  09/09/23 98 F (36.7 C)   BP Readings from Last 3 Encounters:  12/06/23 (!) 144/84  10/24/23 120/70  09/09/23 130/80   Pulse Readings from Last 3 Encounters:  12/06/23 63  10/24/23 (!) 56  09/09/23 69    /10  In general this is a well appearing African-American man in no acute distress. He's alert and oriented x4 and appropriate throughout the examination. Cardiopulmonary assessment is negative for acute distress, and he exhibits normal effort.     KPS = 100  100 - Normal; no complaints; no evidence of disease. 90   - Able to carry on normal activity; minor signs or symptoms of disease. 80   - Normal activity with effort; some signs or symptoms of disease. 34   - Cares for self; unable to carry on normal activity or to do active work. 60   - Requires occasional assistance, but is able to care for most of his personal needs. 50   - Requires considerable assistance and frequent medical care. 40   - Disabled; requires special care and assistance. 30   - Severely disabled;  hospital admission is indicated although death not imminent. 20   - Very sick; hospital admission necessary; active supportive treatment necessary. 10   - Moribund; fatal processes progressing rapidly. 0     - Dead  Karnofsky DA, Abelmann WH, Craver LS and Burchenal [facility] [phone]) The use of the nitrogen mustards in the palliative treatment of carcinoma: with particular reference to bronchogenic carcinoma Cancer 1 634-56  LABORATORY DATA:  Lab Results  Component Value Date   WBC 6.8 10/24/2023   HGB 13.0 10/24/2023   HCT 39.6 10/24/2023   MCV 84.1 10/24/2023   PLT 369.0 10/24/2023   Lab Results  Component Value Date   NA 140 07/27/2023   K 3.8 07/27/2023   CL 107 07/27/2023   CO2 27 07/27/2023   Lab Results  Component Value Date   ALT 13 07/27/2023   AST 15 07/27/2023   ALKPHOS 88 07/27/2023   BILITOT 0.5 07/27/2023     RADIOGRAPHY: No results found.    IMPRESSION/PLAN: 1. 53 y.o. gentleman with locally advanced prostate cancer and adverse pathology with PSA 0.037 s/p RALP 04/2023 for Stage pT3bN1, Gleason 4+5 prostate cancer. Today we reviewed the findings and workup thus far.  We discussed the natural history of prostate cancer.  We reviewed the the implications of positive margins, extracapsular extension, and seminal vesicle involvement on the risk of prostate cancer recurrence. In his case, extraprostatic extension with positive surgical margin, seminal vesicle involvement, and positive lymph nodes were present. We reviewed some of the evidence suggesting an advantage for patients who undergo adjuvant radiotherapy in this setting, in terms of disease control and overall survival. We discussed radiation treatment directed to the prostatic fossa and pelvic lymph nodes with regard to the logistics and delivery of external beam radiation treatment. We also discussed the role of ST-ADT in the management of locally advanced, high risk prostate cancers. He was encouraged to ask questions  that were answered to his stated satisfaction.  At the conclusion of  our conversation, the patient is interested in moving forward with the recommended 7.5 week course of adjuvant external beam therapy concurrent with ST-ADT. He appears to have a good understanding of his disease and our treatment recommendations which are of curative intent and is in agreement with the stated plan.  He has not started ADT so we will share our discussion with Dr. Laverle Patter and make arrangements for a follow up visit, first available, to start ADT now and will proceed with treatment planning/CT SIM as tentatively scheduled on 12/08/23 at 8am, in anticipation of beginning IMRT in the near future.  He is freely signed written consent to proceed today in the office and a copy of this document will be placed in his medical record.  We enjoyed meeting him today and look forward to continuing to participate in his care.  We personally spent 70 minutes in this encounter including chart review, reviewing radiological studies, meeting face-to-face with the patient, entering orders and completing documentation.   Marguarite Arbour, PA-C    Margaretmary Dys, MD  [facility]  Radiation Oncology Direct Dial: [phone]  Fax: [phone] [email or web address]  Skype  LinkedIn   This document serves as a record of services personally performed by Margaretmary Dys, MD and Marcello Fennel, PA-C. It was created on their behalf by Mickie Bail, a trained medical scribe. The creation of this record is based on the scribe's personal observations and the provider's statements to them. This document has been checked and approved by the attending provider.

## 2023-12-06 ENCOUNTER — Ambulatory Visit
Admission: RE | Admit: 2023-12-06 | Discharge: 2023-12-06 | Disposition: A | Discharge status: Home / Self Care | Source: Ambulatory Visit | Attending: Radiation Oncology

## 2023-12-06 ENCOUNTER — Ambulatory Visit
Admission: RE | Admit: 2023-12-06 | Discharge: 2023-12-06 | Disposition: A | Discharge status: Home / Self Care | Source: Ambulatory Visit | Attending: Radiation Oncology | Admitting: Radiation Oncology

## 2023-12-06 ENCOUNTER — Encounter: Payer: Self-pay | Admitting: Radiation Oncology

## 2023-12-06 VITALS — BP 144/84 | HR 63 | Temp 97.7°F | Resp 18 | Ht 74.0 in | Wt 257.2 lb

## 2023-12-06 DIAGNOSIS — Z801 Family history of malignant neoplasm of trachea, bronchus and lung: Secondary | ICD-10-CM | POA: Diagnosis not present

## 2023-12-06 DIAGNOSIS — C61 Malignant neoplasm of prostate: Secondary | ICD-10-CM | POA: Diagnosis present

## 2023-12-06 DIAGNOSIS — F1010 Alcohol abuse, uncomplicated: Secondary | ICD-10-CM | POA: Diagnosis not present

## 2023-12-06 DIAGNOSIS — Z79899 Other long term (current) drug therapy: Secondary | ICD-10-CM | POA: Insufficient documentation

## 2023-12-06 DIAGNOSIS — I10 Essential (primary) hypertension: Secondary | ICD-10-CM | POA: Diagnosis not present

## 2023-12-06 DIAGNOSIS — F17210 Nicotine dependence, cigarettes, uncomplicated: Secondary | ICD-10-CM | POA: Diagnosis not present

## 2023-12-06 DIAGNOSIS — G4730 Sleep apnea, unspecified: Secondary | ICD-10-CM | POA: Insufficient documentation

## 2023-12-06 NOTE — Progress Notes (Signed)
 Introduced myself to the patient as the prostate nurse navigator.  He is here to discuss his radiation treatment options and will proceed with ADT and salvage radiation.  Patient verbalized concerns with finances due to being out of work.  I will place a referral to our LCSW for assessment of any resources available.  I gave him my business card and asked him to call me with questions or concerns.  Verbalized understanding.

## 2023-12-07 ENCOUNTER — Telehealth: Payer: Self-pay | Admitting: *Deleted

## 2023-12-07 ENCOUNTER — Inpatient Hospital Stay: Attending: Radiation Oncology

## 2023-12-07 NOTE — Progress Notes (Signed)
 [facility] Clinical Social Work  Clinical Social Work was referred by Statistician for assessment of psychosocial needs.  Clinical Social Worker contacted patient by phone to offer support and assess for needs.  Patient expressed challenges with finances impacted by treatment. CSW informed patient of limited resources and offered to send in referral for [facility], patient agreed to referral.    Marguerita Merles, LCSW  Clinical Social Worker [facility]

## 2023-12-07 NOTE — Telephone Encounter (Signed)
 Called patient to inform of appt.for ADT on 12-16-23- arrival time- 9 am @ Dr. Vevelyn Royals Office, spoke with patient and he is aware of this appt.

## 2023-12-08 ENCOUNTER — Ambulatory Visit
Admission: RE | Admit: 2023-12-08 | Discharge: 2023-12-08 | Disposition: A | Discharge status: Home / Self Care | Source: Ambulatory Visit | Attending: Radiation Oncology | Admitting: Radiation Oncology

## 2023-12-08 DIAGNOSIS — C61 Malignant neoplasm of prostate: Secondary | ICD-10-CM | POA: Insufficient documentation

## 2023-12-08 NOTE — Progress Notes (Signed)
  Radiation Oncology         [phone] ________________________________  Name: Walter Nichols MRN: [id number]  Date: 12/08/2023  DOB: [birth year]  SIMULATION AND TREATMENT PLANNING NOTE    ICD-10-CM   1. Prostate cancer [facility])  C61       DIAGNOSIS:  53 y.o. gentleman with locally advanced prostate cancer and adverse pathology with PSA 0.037 s/p RALP 04/2023 for Stage pT3bN1, Gleason 4+5 prostate cancer  NARRATIVE:  The patient was brought to the CT Simulation planning suite.  Identity was confirmed.  All relevant records and images related to the planned course of therapy were reviewed.  The patient freely provided informed written consent to proceed with treatment after reviewing the details related to the planned course of therapy. The consent form was witnessed and verified by the simulation staff.  Then, the patient was set-up in a stable reproducible supine position for radiation therapy.  A vacuum lock pillow device was custom fabricated to position his legs in a reproducible immobilized position.  Then, I performed a urethrogram under sterile conditions to identify the prostatic bed.  CT images were obtained.  Surface markings were placed.  The CT images were loaded into the planning software.  Then the prostate bed target, pelvic lymph node target and avoidance structures including the rectum, bladder, bowel and hips were contoured.  Treatment planning then occurred.  The radiation prescription was entered and confirmed.  A total of one complex treatment devices were fabricated. I have requested : Intensity Modulated Radiotherapy (IMRT) is medically necessary for this case for the following reason:  Rectal sparing.Marland Kitchen  PLAN:  The patient will receive 45 Gy in 25 fractions of 1.8 Gy, followed by a boost to the prostate bed to a total dose of 68.4 Gy with 13 additional fractions of 1.8 Gy.   ________________________________  [place] Kathrynn Running, M.D.

## 2023-12-16 ENCOUNTER — Telehealth: Payer: Self-pay | Admitting: *Deleted

## 2023-12-16 ENCOUNTER — Ambulatory Visit
Admission: RE | Admit: 2023-12-16 | Discharge: 2023-12-16 | Disposition: A | Discharge status: Home / Self Care | Source: Ambulatory Visit | Attending: Radiation Oncology | Admitting: Radiation Oncology

## 2023-12-16 DIAGNOSIS — C61 Malignant neoplasm of prostate: Secondary | ICD-10-CM | POA: Insufficient documentation

## 2023-12-16 NOTE — Telephone Encounter (Signed)
 Patient had an ADT on 12-07-23 @ 11:30 am @ Dr. Vevelyn Royals Office

## 2023-12-20 ENCOUNTER — Other Ambulatory Visit: Payer: Self-pay

## 2023-12-20 ENCOUNTER — Other Ambulatory Visit: Payer: Self-pay | Admitting: Medical

## 2023-12-20 DIAGNOSIS — C61 Malignant neoplasm of prostate: Secondary | ICD-10-CM | POA: Diagnosis not present

## 2023-12-20 LAB — RAD ONC ARIA SESSION SUMMARY
Course Elapsed Days: 0
Plan Fractions Treated to Date: 1
Plan Prescribed Dose Per Fraction: 1.8 Gy
Plan Total Fractions Prescribed: 25
Plan Total Prescribed Dose: 45 Gy
Reference Point Dosage Given to Date: 1.8 Gy
Reference Point Session Dosage Given: 1.8 Gy
Session Number: 1

## 2023-12-20 MED ORDER — BUSPIRONE HCL 15 MG PO TABS: 15.0000 mg | ORAL_TABLET | Freq: Three times a day (TID) | ORAL | 1 refills | Status: DC

## 2023-12-20 MED ORDER — TRAZODONE HCL 50 MG PO TABS: 25.0000 mg | ORAL_TABLET | Freq: Every evening | ORAL | 3 refills | Status: DC | PRN

## 2023-12-20 NOTE — Telephone Encounter (Signed)
 Copied from CRM [id number]. Topic: Clinical - Medication Refill >> Dec 20, 2023  2:52 PM Sonny Dandy B wrote: Most Recent Primary Care Visit:  Provider: Esperanza Richters  Department: [facility]  Visit Type: OFFICE VISIT  Date: 10/24/2023  Medication: busPIRone (BUSPAR) 15 MG tablet, traZODone (DESYREL) 50 MG tablet  Has the patient contacted their pharmacy? Yes (Agent: If no, request that the patient contact the pharmacy for the refill. If patient does not wish to contact the pharmacy document the reason why and proceed with request.) (Agent: If yes, when and what did the pharmacy advise?)  Is this the correct pharmacy for this prescription? Yes If no, delete pharmacy and type the correct one.  This is the patient's preferred pharmacy:  [id number] [place], [place] - [address] [place] [address] Phone: [phone] Fax: [phone]   Has the prescription been filled recently? Yes  Is the patient out of the medication? Yes  Has the patient been seen for an appointment in the last year OR does the patient have an upcoming appointment? Yes  Can we respond through MyChart? Yes  Agent: Please be advised that Rx refills may take up to 3 business days. We ask that you follow-up with your pharmacy.

## 2023-12-21 ENCOUNTER — Ambulatory Visit
Admission: RE | Admit: 2023-12-21 | Discharge: 2023-12-21 | Disposition: A | Discharge status: Home / Self Care | Source: Ambulatory Visit | Attending: Radiation Oncology | Admitting: Radiation Oncology

## 2023-12-21 ENCOUNTER — Other Ambulatory Visit: Payer: Self-pay

## 2023-12-21 DIAGNOSIS — C61 Malignant neoplasm of prostate: Secondary | ICD-10-CM | POA: Diagnosis not present

## 2023-12-21 LAB — RAD ONC ARIA SESSION SUMMARY
Course Elapsed Days: 1
Plan Fractions Treated to Date: 2
Plan Prescribed Dose Per Fraction: 1.8 Gy
Plan Total Fractions Prescribed: 25
Plan Total Prescribed Dose: 45 Gy
Reference Point Dosage Given to Date: 3.6 Gy
Reference Point Session Dosage Given: 1.8 Gy
Session Number: 2

## 2023-12-22 ENCOUNTER — Telehealth: Payer: Self-pay | Admitting: Radiation Oncology

## 2023-12-22 ENCOUNTER — Ambulatory Visit

## 2023-12-22 NOTE — Telephone Encounter (Addendum)
 4/10 patient called to cancel treatment appt today due to being sick to stomach with weakness after hormones shot today.  He stated will be here tomorrow for next treatment appt.  Email sent to Support RTT and copied L4 machine.

## 2023-12-23 ENCOUNTER — Other Ambulatory Visit: Payer: Self-pay

## 2023-12-23 ENCOUNTER — Ambulatory Visit
Admission: RE | Admit: 2023-12-23 | Discharge: 2023-12-23 | Disposition: A | Discharge status: Home / Self Care | Source: Ambulatory Visit | Attending: Radiation Oncology | Admitting: Radiation Oncology

## 2023-12-23 DIAGNOSIS — C61 Malignant neoplasm of prostate: Secondary | ICD-10-CM | POA: Diagnosis not present

## 2023-12-23 LAB — RAD ONC ARIA SESSION SUMMARY
Course Elapsed Days: 3
Plan Fractions Treated to Date: 3
Plan Prescribed Dose Per Fraction: 1.8 Gy
Plan Total Fractions Prescribed: 25
Plan Total Prescribed Dose: 45 Gy
Reference Point Dosage Given to Date: 5.4 Gy
Reference Point Session Dosage Given: 1.8 Gy
Session Number: 3

## 2023-12-23 NOTE — Progress Notes (Signed)
 Patient received Eligard 45mg  on 4/10.

## 2023-12-26 ENCOUNTER — Other Ambulatory Visit: Payer: Self-pay

## 2023-12-26 ENCOUNTER — Ambulatory Visit
Admission: RE | Admit: 2023-12-26 | Discharge: 2023-12-26 | Disposition: A | Discharge status: Home / Self Care | Source: Ambulatory Visit | Attending: Radiation Oncology | Admitting: Radiation Oncology

## 2023-12-26 DIAGNOSIS — C61 Malignant neoplasm of prostate: Secondary | ICD-10-CM | POA: Diagnosis not present

## 2023-12-26 LAB — RAD ONC ARIA SESSION SUMMARY
Course Elapsed Days: 6
Plan Fractions Treated to Date: 4
Plan Prescribed Dose Per Fraction: 1.8 Gy
Plan Total Fractions Prescribed: 25
Plan Total Prescribed Dose: 45 Gy
Reference Point Dosage Given to Date: 7.2 Gy
Reference Point Session Dosage Given: 1.8 Gy
Session Number: 4

## 2023-12-27 ENCOUNTER — Other Ambulatory Visit: Payer: Self-pay

## 2023-12-27 ENCOUNTER — Ambulatory Visit
Admission: RE | Admit: 2023-12-27 | Discharge: 2023-12-27 | Disposition: A | Discharge status: Home / Self Care | Source: Ambulatory Visit | Attending: Radiation Oncology | Admitting: Radiation Oncology

## 2023-12-27 DIAGNOSIS — C61 Malignant neoplasm of prostate: Secondary | ICD-10-CM | POA: Diagnosis not present

## 2023-12-27 LAB — RAD ONC ARIA SESSION SUMMARY
Course Elapsed Days: 7
Plan Fractions Treated to Date: 5
Plan Prescribed Dose Per Fraction: 1.8 Gy
Plan Total Fractions Prescribed: 25
Plan Total Prescribed Dose: 45 Gy
Reference Point Dosage Given to Date: 9 Gy
Reference Point Session Dosage Given: 1.8 Gy
Session Number: 5

## 2023-12-28 ENCOUNTER — Ambulatory Visit
Admission: RE | Admit: 2023-12-28 | Discharge: 2023-12-28 | Disposition: A | Discharge status: Home / Self Care | Source: Ambulatory Visit | Attending: Radiation Oncology | Admitting: Radiation Oncology

## 2023-12-28 ENCOUNTER — Other Ambulatory Visit: Payer: Self-pay

## 2023-12-28 DIAGNOSIS — C61 Malignant neoplasm of prostate: Secondary | ICD-10-CM | POA: Diagnosis not present

## 2023-12-28 LAB — RAD ONC ARIA SESSION SUMMARY
Course Elapsed Days: 8
Plan Fractions Treated to Date: 6
Plan Prescribed Dose Per Fraction: 1.8 Gy
Plan Total Fractions Prescribed: 25
Plan Total Prescribed Dose: 45 Gy
Reference Point Dosage Given to Date: 10.8 Gy
Reference Point Session Dosage Given: 1.8 Gy
Session Number: 6

## 2023-12-29 ENCOUNTER — Other Ambulatory Visit: Payer: Self-pay

## 2023-12-29 ENCOUNTER — Ambulatory Visit
Admission: RE | Admit: 2023-12-29 | Discharge: 2023-12-29 | Disposition: A | Discharge status: Home / Self Care | Source: Ambulatory Visit | Attending: Radiation Oncology | Admitting: Radiation Oncology

## 2023-12-29 DIAGNOSIS — C61 Malignant neoplasm of prostate: Secondary | ICD-10-CM | POA: Diagnosis not present

## 2023-12-29 LAB — RAD ONC ARIA SESSION SUMMARY
Course Elapsed Days: 9
Plan Fractions Treated to Date: 7
Plan Prescribed Dose Per Fraction: 1.8 Gy
Plan Total Fractions Prescribed: 25
Plan Total Prescribed Dose: 45 Gy
Reference Point Dosage Given to Date: 12.6 Gy
Reference Point Session Dosage Given: 1.8 Gy
Session Number: 7

## 2023-12-30 ENCOUNTER — Ambulatory Visit
Admission: RE | Admit: 2023-12-30 | Discharge: 2023-12-30 | Disposition: A | Discharge status: Home / Self Care | Source: Ambulatory Visit | Attending: Radiation Oncology | Admitting: Radiation Oncology

## 2023-12-30 ENCOUNTER — Other Ambulatory Visit: Payer: Self-pay

## 2023-12-30 DIAGNOSIS — C61 Malignant neoplasm of prostate: Secondary | ICD-10-CM | POA: Diagnosis not present

## 2023-12-30 LAB — RAD ONC ARIA SESSION SUMMARY
Course Elapsed Days: 10
Plan Fractions Treated to Date: 8
Plan Prescribed Dose Per Fraction: 1.8 Gy
Plan Total Fractions Prescribed: 25
Plan Total Prescribed Dose: 45 Gy
Reference Point Dosage Given to Date: 14.4 Gy
Reference Point Session Dosage Given: 1.8 Gy
Session Number: 8

## 2024-01-02 ENCOUNTER — Other Ambulatory Visit: Payer: Self-pay

## 2024-01-02 ENCOUNTER — Ambulatory Visit
Admission: RE | Admit: 2024-01-02 | Discharge: 2024-01-02 | Disposition: A | Discharge status: Home / Self Care | Source: Ambulatory Visit | Attending: Radiation Oncology | Admitting: Radiation Oncology

## 2024-01-02 DIAGNOSIS — C61 Malignant neoplasm of prostate: Secondary | ICD-10-CM | POA: Diagnosis not present

## 2024-01-02 LAB — RAD ONC ARIA SESSION SUMMARY
Course Elapsed Days: 13
Plan Fractions Treated to Date: 9
Plan Prescribed Dose Per Fraction: 1.8 Gy
Plan Total Fractions Prescribed: 25
Plan Total Prescribed Dose: 45 Gy
Reference Point Dosage Given to Date: 16.2 Gy
Reference Point Session Dosage Given: 1.8 Gy
Session Number: 9

## 2024-01-03 ENCOUNTER — Ambulatory Visit
Admission: RE | Admit: 2024-01-03 | Discharge: 2024-01-03 | Disposition: A | Discharge status: Home / Self Care | Source: Ambulatory Visit | Attending: Radiation Oncology | Admitting: Radiation Oncology

## 2024-01-03 ENCOUNTER — Other Ambulatory Visit: Payer: Self-pay

## 2024-01-03 DIAGNOSIS — C61 Malignant neoplasm of prostate: Secondary | ICD-10-CM | POA: Diagnosis not present

## 2024-01-03 LAB — RAD ONC ARIA SESSION SUMMARY
Course Elapsed Days: 14
Plan Fractions Treated to Date: 10
Plan Prescribed Dose Per Fraction: 1.8 Gy
Plan Total Fractions Prescribed: 25
Plan Total Prescribed Dose: 45 Gy
Reference Point Dosage Given to Date: 18 Gy
Reference Point Session Dosage Given: 1.8 Gy
Session Number: 10

## 2024-01-04 ENCOUNTER — Telehealth: Payer: Self-pay

## 2024-01-04 ENCOUNTER — Ambulatory Visit

## 2024-01-05 ENCOUNTER — Ambulatory Visit
Admission: RE | Admit: 2024-01-05 | Discharge: 2024-01-05 | Disposition: A | Discharge status: Home / Self Care | Source: Ambulatory Visit | Attending: Radiation Oncology | Admitting: Radiation Oncology

## 2024-01-05 ENCOUNTER — Ambulatory Visit

## 2024-01-05 ENCOUNTER — Other Ambulatory Visit: Payer: Self-pay

## 2024-01-05 DIAGNOSIS — C61 Malignant neoplasm of prostate: Secondary | ICD-10-CM | POA: Diagnosis not present

## 2024-01-05 LAB — RAD ONC ARIA SESSION SUMMARY
Course Elapsed Days: 16
Plan Fractions Treated to Date: 11
Plan Prescribed Dose Per Fraction: 1.8 Gy
Plan Total Fractions Prescribed: 25
Plan Total Prescribed Dose: 45 Gy
Reference Point Dosage Given to Date: 19.8 Gy
Reference Point Session Dosage Given: 1.8 Gy
Session Number: 11

## 2024-01-06 ENCOUNTER — Ambulatory Visit
Admission: RE | Admit: 2024-01-06 | Discharge: 2024-01-06 | Disposition: A | Discharge status: Home / Self Care | Source: Ambulatory Visit | Attending: Radiation Oncology | Admitting: Radiation Oncology

## 2024-01-06 ENCOUNTER — Other Ambulatory Visit: Payer: Self-pay

## 2024-01-06 DIAGNOSIS — C61 Malignant neoplasm of prostate: Secondary | ICD-10-CM | POA: Diagnosis not present

## 2024-01-06 LAB — RAD ONC ARIA SESSION SUMMARY
Course Elapsed Days: 17
Plan Fractions Treated to Date: 12
Plan Prescribed Dose Per Fraction: 1.8 Gy
Plan Total Fractions Prescribed: 25
Plan Total Prescribed Dose: 45 Gy
Reference Point Dosage Given to Date: 21.6 Gy
Reference Point Session Dosage Given: 1.8 Gy
Session Number: 12

## 2024-01-09 ENCOUNTER — Other Ambulatory Visit: Payer: Self-pay

## 2024-01-09 ENCOUNTER — Ambulatory Visit
Admission: RE | Admit: 2024-01-09 | Discharge: 2024-01-09 | Disposition: A | Discharge status: Home / Self Care | Source: Ambulatory Visit | Attending: Radiation Oncology | Admitting: Radiation Oncology

## 2024-01-09 ENCOUNTER — Telehealth: Payer: Self-pay

## 2024-01-09 DIAGNOSIS — C61 Malignant neoplasm of prostate: Secondary | ICD-10-CM | POA: Diagnosis not present

## 2024-01-09 LAB — RAD ONC ARIA SESSION SUMMARY
Course Elapsed Days: 20
Plan Fractions Treated to Date: 13
Plan Prescribed Dose Per Fraction: 1.8 Gy
Plan Total Fractions Prescribed: 25
Plan Total Prescribed Dose: 45 Gy
Reference Point Dosage Given to Date: 23.4 Gy
Reference Point Session Dosage Given: 1.8 Gy
Session Number: 13

## 2024-01-09 NOTE — Telephone Encounter (Signed)
 RN spoke with Walter Nichols about sending in medical accomodation form to his employers with medical note attached per his request.

## 2024-01-10 ENCOUNTER — Other Ambulatory Visit: Payer: Self-pay

## 2024-01-10 ENCOUNTER — Ambulatory Visit
Admission: RE | Admit: 2024-01-10 | Discharge: 2024-01-10 | Disposition: A | Discharge status: Home / Self Care | Source: Ambulatory Visit | Attending: Radiation Oncology | Admitting: Radiation Oncology

## 2024-01-10 DIAGNOSIS — C61 Malignant neoplasm of prostate: Secondary | ICD-10-CM | POA: Diagnosis not present

## 2024-01-10 LAB — RAD ONC ARIA SESSION SUMMARY
Course Elapsed Days: 21
Plan Fractions Treated to Date: 14
Plan Prescribed Dose Per Fraction: 1.8 Gy
Plan Total Fractions Prescribed: 25
Plan Total Prescribed Dose: 45 Gy
Reference Point Dosage Given to Date: 25.2 Gy
Reference Point Session Dosage Given: 1.8 Gy
Session Number: 14

## 2024-01-11 ENCOUNTER — Ambulatory Visit
Admission: RE | Admit: 2024-01-11 | Discharge: 2024-01-11 | Disposition: A | Discharge status: Home / Self Care | Source: Ambulatory Visit | Attending: Radiation Oncology | Admitting: Radiation Oncology

## 2024-01-11 ENCOUNTER — Other Ambulatory Visit: Payer: Self-pay

## 2024-01-11 DIAGNOSIS — C61 Malignant neoplasm of prostate: Secondary | ICD-10-CM | POA: Diagnosis not present

## 2024-01-11 LAB — RAD ONC ARIA SESSION SUMMARY
Course Elapsed Days: 22
Plan Fractions Treated to Date: 15
Plan Prescribed Dose Per Fraction: 1.8 Gy
Plan Total Fractions Prescribed: 25
Plan Total Prescribed Dose: 45 Gy
Reference Point Dosage Given to Date: 27 Gy
Reference Point Session Dosage Given: 1.8 Gy
Session Number: 15

## 2024-01-12 ENCOUNTER — Other Ambulatory Visit: Payer: Self-pay

## 2024-01-12 ENCOUNTER — Ambulatory Visit
Admission: RE | Admit: 2024-01-12 | Discharge: 2024-01-12 | Disposition: A | Discharge status: Home / Self Care | Payer: Self-pay | Source: Ambulatory Visit | Attending: Radiation Oncology | Admitting: Radiation Oncology

## 2024-01-12 ENCOUNTER — Ambulatory Visit

## 2024-01-12 DIAGNOSIS — C61 Malignant neoplasm of prostate: Secondary | ICD-10-CM | POA: Diagnosis present

## 2024-01-12 LAB — RAD ONC ARIA SESSION SUMMARY
Course Elapsed Days: 23
Plan Fractions Treated to Date: 16
Plan Prescribed Dose Per Fraction: 1.8 Gy
Plan Total Fractions Prescribed: 25
Plan Total Prescribed Dose: 45 Gy
Reference Point Dosage Given to Date: 28.8 Gy
Reference Point Session Dosage Given: 1.8 Gy
Session Number: 16

## 2024-01-13 ENCOUNTER — Other Ambulatory Visit: Payer: Self-pay

## 2024-01-13 ENCOUNTER — Telehealth: Payer: Self-pay | Admitting: Neurology

## 2024-01-13 ENCOUNTER — Ambulatory Visit
Admission: RE | Admit: 2024-01-13 | Discharge: 2024-01-13 | Disposition: A | Discharge status: Home / Self Care | Payer: Self-pay | Source: Ambulatory Visit | Attending: Radiation Oncology | Admitting: Radiation Oncology

## 2024-01-13 DIAGNOSIS — C61 Malignant neoplasm of prostate: Secondary | ICD-10-CM | POA: Diagnosis not present

## 2024-01-13 LAB — RAD ONC ARIA SESSION SUMMARY
Course Elapsed Days: 24
Plan Fractions Treated to Date: 17
Plan Prescribed Dose Per Fraction: 1.8 Gy
Plan Total Fractions Prescribed: 25
Plan Total Prescribed Dose: 45 Gy
Reference Point Dosage Given to Date: 30.6 Gy
Reference Point Session Dosage Given: 1.8 Gy
Session Number: 17

## 2024-01-13 NOTE — Telephone Encounter (Signed)
 Copied from CRM [id number]. Topic: General - Other >> Jan 13, 2024  4:09 PM Walter Nichols wrote: Reason for CRM: pt would like for nurse to send progress notes from 10/14/2022-now to fax [phone] so he can get assistance

## 2024-01-16 ENCOUNTER — Ambulatory Visit
Admission: RE | Admit: 2024-01-16 | Discharge: 2024-01-16 | Disposition: A | Discharge status: Home / Self Care | Payer: Self-pay | Source: Ambulatory Visit | Attending: Radiation Oncology | Admitting: Radiation Oncology

## 2024-01-16 ENCOUNTER — Telehealth: Payer: Self-pay

## 2024-01-16 ENCOUNTER — Other Ambulatory Visit: Payer: Self-pay

## 2024-01-16 DIAGNOSIS — C61 Malignant neoplasm of prostate: Secondary | ICD-10-CM | POA: Diagnosis not present

## 2024-01-16 LAB — RAD ONC ARIA SESSION SUMMARY
Course Elapsed Days: 27
Plan Fractions Treated to Date: 18
Plan Prescribed Dose Per Fraction: 1.8 Gy
Plan Total Fractions Prescribed: 25
Plan Total Prescribed Dose: 45 Gy
Reference Point Dosage Given to Date: 32.4 Gy
Reference Point Session Dosage Given: 1.8 Gy
Session Number: 18

## 2024-01-16 NOTE — Telephone Encounter (Signed)
OV sent

## 2024-01-16 NOTE — Telephone Encounter (Signed)
 Also faxed to [phone]

## 2024-01-17 ENCOUNTER — Ambulatory Visit
Admission: RE | Admit: 2024-01-17 | Discharge: 2024-01-17 | Disposition: A | Discharge status: Home / Self Care | Payer: Self-pay | Source: Ambulatory Visit | Attending: Radiation Oncology | Admitting: Radiation Oncology

## 2024-01-17 ENCOUNTER — Other Ambulatory Visit: Payer: Self-pay | Admitting: Medical

## 2024-01-17 ENCOUNTER — Other Ambulatory Visit: Payer: Self-pay

## 2024-01-17 DIAGNOSIS — C61 Malignant neoplasm of prostate: Secondary | ICD-10-CM | POA: Diagnosis not present

## 2024-01-17 LAB — RAD ONC ARIA SESSION SUMMARY
Course Elapsed Days: 28
Plan Fractions Treated to Date: 19
Plan Prescribed Dose Per Fraction: 1.8 Gy
Plan Total Fractions Prescribed: 25
Plan Total Prescribed Dose: 45 Gy
Reference Point Dosage Given to Date: 34.2 Gy
Reference Point Session Dosage Given: 1.8 Gy
Session Number: 19

## 2024-01-18 ENCOUNTER — Other Ambulatory Visit: Payer: Self-pay

## 2024-01-18 ENCOUNTER — Ambulatory Visit
Admission: RE | Admit: 2024-01-18 | Discharge: 2024-01-18 | Disposition: A | Discharge status: Home / Self Care | Payer: Self-pay | Source: Ambulatory Visit | Attending: Radiation Oncology | Admitting: Radiation Oncology

## 2024-01-18 DIAGNOSIS — C61 Malignant neoplasm of prostate: Secondary | ICD-10-CM | POA: Diagnosis not present

## 2024-01-18 LAB — RAD ONC ARIA SESSION SUMMARY
Course Elapsed Days: 29
Plan Fractions Treated to Date: 20
Plan Prescribed Dose Per Fraction: 1.8 Gy
Plan Total Fractions Prescribed: 25
Plan Total Prescribed Dose: 45 Gy
Reference Point Dosage Given to Date: 36 Gy
Reference Point Session Dosage Given: 1.8 Gy
Session Number: 20

## 2024-01-19 ENCOUNTER — Other Ambulatory Visit: Payer: Self-pay | Admitting: Radiation Oncology

## 2024-01-19 ENCOUNTER — Ambulatory Visit

## 2024-01-19 ENCOUNTER — Ambulatory Visit
Admission: RE | Admit: 2024-01-19 | Discharge: 2024-01-19 | Disposition: A | Discharge status: Home / Self Care | Source: Ambulatory Visit | Attending: Radiation Oncology | Admitting: Radiation Oncology

## 2024-01-19 ENCOUNTER — Other Ambulatory Visit: Payer: Self-pay

## 2024-01-19 ENCOUNTER — Ambulatory Visit: Payer: Self-pay

## 2024-01-19 DIAGNOSIS — C61 Malignant neoplasm of prostate: Secondary | ICD-10-CM | POA: Diagnosis not present

## 2024-01-19 LAB — RAD ONC ARIA SESSION SUMMARY
Course Elapsed Days: 30
Plan Fractions Treated to Date: 21
Plan Prescribed Dose Per Fraction: 1.8 Gy
Plan Total Fractions Prescribed: 25
Plan Total Prescribed Dose: 45 Gy
Reference Point Dosage Given to Date: 37.8 Gy
Reference Point Session Dosage Given: 1.8 Gy
Session Number: 21

## 2024-01-19 MED ORDER — MEGESTROL ACETATE 20 MG PO TABS: 20.0000 mg | ORAL_TABLET | Freq: Every day | ORAL | 2 refills | Status: AC

## 2024-01-20 ENCOUNTER — Ambulatory Visit

## 2024-01-20 ENCOUNTER — Ambulatory Visit
Admission: RE | Admit: 2024-01-20 | Discharge: 2024-01-20 | Disposition: A | Discharge status: Home / Self Care | Payer: Self-pay | Source: Ambulatory Visit | Attending: Radiation Oncology | Admitting: Radiation Oncology

## 2024-01-20 ENCOUNTER — Other Ambulatory Visit: Payer: Self-pay

## 2024-01-20 DIAGNOSIS — C61 Malignant neoplasm of prostate: Secondary | ICD-10-CM | POA: Diagnosis not present

## 2024-01-20 LAB — RAD ONC ARIA SESSION SUMMARY
Course Elapsed Days: 31
Plan Fractions Treated to Date: 22
Plan Prescribed Dose Per Fraction: 1.8 Gy
Plan Total Fractions Prescribed: 25
Plan Total Prescribed Dose: 45 Gy
Reference Point Dosage Given to Date: 39.6 Gy
Reference Point Session Dosage Given: 1.8 Gy
Session Number: 22

## 2024-01-23 ENCOUNTER — Ambulatory Visit
Admission: RE | Admit: 2024-01-23 | Discharge: 2024-01-23 | Disposition: A | Discharge status: Home / Self Care | Payer: Self-pay | Source: Ambulatory Visit | Attending: Radiation Oncology | Admitting: Radiation Oncology

## 2024-01-23 ENCOUNTER — Other Ambulatory Visit: Payer: Self-pay

## 2024-01-23 DIAGNOSIS — C61 Malignant neoplasm of prostate: Secondary | ICD-10-CM | POA: Diagnosis not present

## 2024-01-23 LAB — RAD ONC ARIA SESSION SUMMARY
Course Elapsed Days: 34
Plan Fractions Treated to Date: 23
Plan Prescribed Dose Per Fraction: 1.8 Gy
Plan Total Fractions Prescribed: 25
Plan Total Prescribed Dose: 45 Gy
Reference Point Dosage Given to Date: 41.4 Gy
Reference Point Session Dosage Given: 1.8 Gy
Session Number: 23

## 2024-01-24 ENCOUNTER — Ambulatory Visit
Admission: RE | Admit: 2024-01-24 | Discharge: 2024-01-24 | Disposition: A | Discharge status: Home / Self Care | Payer: Self-pay | Source: Ambulatory Visit | Attending: Radiation Oncology | Admitting: Radiation Oncology

## 2024-01-24 ENCOUNTER — Other Ambulatory Visit: Payer: Self-pay

## 2024-01-24 ENCOUNTER — Ambulatory Visit: Payer: Self-pay

## 2024-01-24 DIAGNOSIS — C61 Malignant neoplasm of prostate: Secondary | ICD-10-CM | POA: Diagnosis not present

## 2024-01-24 LAB — RAD ONC ARIA SESSION SUMMARY
Course Elapsed Days: 35
Plan Fractions Treated to Date: 24
Plan Prescribed Dose Per Fraction: 1.8 Gy
Plan Total Fractions Prescribed: 25
Plan Total Prescribed Dose: 45 Gy
Reference Point Dosage Given to Date: 43.2 Gy
Reference Point Session Dosage Given: 1.8 Gy
Session Number: 24

## 2024-01-25 ENCOUNTER — Ambulatory Visit

## 2024-01-25 ENCOUNTER — Ambulatory Visit: Payer: Self-pay

## 2024-01-26 ENCOUNTER — Ambulatory Visit: Payer: Self-pay

## 2024-01-26 ENCOUNTER — Ambulatory Visit

## 2024-01-26 ENCOUNTER — Ambulatory Visit
Admission: RE | Admit: 2024-01-26 | Discharge: 2024-01-26 | Disposition: A | Discharge status: Home / Self Care | Source: Ambulatory Visit | Attending: Radiation Oncology | Admitting: Radiation Oncology

## 2024-01-26 ENCOUNTER — Other Ambulatory Visit: Payer: Self-pay

## 2024-01-26 DIAGNOSIS — C61 Malignant neoplasm of prostate: Secondary | ICD-10-CM | POA: Diagnosis not present

## 2024-01-26 LAB — RAD ONC ARIA SESSION SUMMARY
Course Elapsed Days: 37
Plan Fractions Treated to Date: 25
Plan Prescribed Dose Per Fraction: 1.8 Gy
Plan Total Fractions Prescribed: 25
Plan Total Prescribed Dose: 45 Gy
Reference Point Dosage Given to Date: 45 Gy
Reference Point Session Dosage Given: 1.8 Gy
Session Number: 25

## 2024-01-27 ENCOUNTER — Ambulatory Visit: Payer: Self-pay

## 2024-01-27 ENCOUNTER — Ambulatory Visit
Admission: RE | Admit: 2024-01-27 | Discharge: 2024-01-27 | Disposition: A | Discharge status: Home / Self Care | Source: Ambulatory Visit | Attending: Radiation Oncology | Admitting: Radiation Oncology

## 2024-01-27 ENCOUNTER — Other Ambulatory Visit: Payer: Self-pay

## 2024-01-27 DIAGNOSIS — C61 Malignant neoplasm of prostate: Secondary | ICD-10-CM | POA: Diagnosis not present

## 2024-01-27 LAB — RAD ONC ARIA SESSION SUMMARY
Course Elapsed Days: 38
Plan Fractions Treated to Date: 1
Plan Prescribed Dose Per Fraction: 1.8 Gy
Plan Total Fractions Prescribed: 13
Plan Total Prescribed Dose: 23.4 Gy
Reference Point Dosage Given to Date: 1.8 Gy
Reference Point Session Dosage Given: 1.8 Gy
Session Number: 26

## 2024-01-29 NOTE — Progress Notes (Addendum)
 [name] came over wanting let nurse and doctor know he had some blood in stool x 2 on 01/30/2024.  He reports has history of hemorrhoidectomy x 2 and that he will let his PCP know as well.  No pain with BM's in fact he's been having loose to diarrhea stools.  He also reports last colonoscopy was at age 53-46.

## 2024-01-30 ENCOUNTER — Other Ambulatory Visit: Payer: Self-pay

## 2024-01-30 ENCOUNTER — Ambulatory Visit
Admission: RE | Admit: 2024-01-30 | Discharge: 2024-01-30 | Disposition: A | Discharge status: Home / Self Care | Payer: Self-pay | Source: Ambulatory Visit | Attending: Radiation Oncology | Admitting: Radiation Oncology

## 2024-01-30 DIAGNOSIS — C61 Malignant neoplasm of prostate: Secondary | ICD-10-CM | POA: Diagnosis not present

## 2024-01-30 LAB — RAD ONC ARIA SESSION SUMMARY
Course Elapsed Days: 41
Plan Fractions Treated to Date: 2
Plan Prescribed Dose Per Fraction: 1.8 Gy
Plan Total Fractions Prescribed: 13
Plan Total Prescribed Dose: 23.4 Gy
Reference Point Dosage Given to Date: 3.6 Gy
Reference Point Session Dosage Given: 1.8 Gy
Session Number: 27

## 2024-01-31 ENCOUNTER — Ambulatory Visit
Admission: RE | Admit: 2024-01-31 | Discharge: 2024-01-31 | Disposition: A | Discharge status: Home / Self Care | Payer: Self-pay | Source: Ambulatory Visit | Attending: Radiation Oncology | Admitting: Radiation Oncology

## 2024-01-31 ENCOUNTER — Other Ambulatory Visit: Payer: Self-pay

## 2024-01-31 DIAGNOSIS — C61 Malignant neoplasm of prostate: Secondary | ICD-10-CM | POA: Diagnosis not present

## 2024-01-31 LAB — RAD ONC ARIA SESSION SUMMARY
Course Elapsed Days: 42
Plan Fractions Treated to Date: 3
Plan Prescribed Dose Per Fraction: 1.8 Gy
Plan Total Fractions Prescribed: 13
Plan Total Prescribed Dose: 23.4 Gy
Reference Point Dosage Given to Date: 5.4 Gy
Reference Point Session Dosage Given: 1.8 Gy
Session Number: 28

## 2024-02-01 ENCOUNTER — Ambulatory Visit
Admission: RE | Admit: 2024-02-01 | Discharge: 2024-02-01 | Disposition: A | Discharge status: Home / Self Care | Payer: Self-pay | Source: Ambulatory Visit | Attending: Radiation Oncology | Admitting: Radiation Oncology

## 2024-02-01 ENCOUNTER — Other Ambulatory Visit: Payer: Self-pay

## 2024-02-01 DIAGNOSIS — C61 Malignant neoplasm of prostate: Secondary | ICD-10-CM | POA: Diagnosis not present

## 2024-02-01 LAB — RAD ONC ARIA SESSION SUMMARY
Course Elapsed Days: 43
Plan Fractions Treated to Date: 4
Plan Prescribed Dose Per Fraction: 1.8 Gy
Plan Total Fractions Prescribed: 13
Plan Total Prescribed Dose: 23.4 Gy
Reference Point Dosage Given to Date: 7.2 Gy
Reference Point Session Dosage Given: 1.8 Gy
Session Number: 29

## 2024-02-02 ENCOUNTER — Ambulatory Visit: Admission: RE | Admit: 2024-02-02 | Source: Ambulatory Visit

## 2024-02-02 ENCOUNTER — Other Ambulatory Visit: Payer: Self-pay

## 2024-02-02 ENCOUNTER — Ambulatory Visit
Admission: RE | Admit: 2024-02-02 | Discharge: 2024-02-02 | Disposition: A | Discharge status: Home / Self Care | Payer: Self-pay | Source: Ambulatory Visit | Attending: Radiation Oncology | Admitting: Radiation Oncology

## 2024-02-02 DIAGNOSIS — C61 Malignant neoplasm of prostate: Secondary | ICD-10-CM | POA: Diagnosis not present

## 2024-02-02 LAB — RAD ONC ARIA SESSION SUMMARY
Course Elapsed Days: 44
Plan Fractions Treated to Date: 5
Plan Prescribed Dose Per Fraction: 1.8 Gy
Plan Total Fractions Prescribed: 13
Plan Total Prescribed Dose: 23.4 Gy
Reference Point Dosage Given to Date: 9 Gy
Reference Point Session Dosage Given: 1.8 Gy
Session Number: 30

## 2024-02-03 ENCOUNTER — Other Ambulatory Visit: Payer: Self-pay

## 2024-02-03 ENCOUNTER — Ambulatory Visit

## 2024-02-03 ENCOUNTER — Ambulatory Visit
Admission: RE | Admit: 2024-02-03 | Discharge: 2024-02-03 | Disposition: A | Discharge status: Home / Self Care | Payer: Self-pay | Source: Ambulatory Visit | Attending: Radiation Oncology | Admitting: Radiation Oncology

## 2024-02-03 DIAGNOSIS — C61 Malignant neoplasm of prostate: Secondary | ICD-10-CM | POA: Diagnosis not present

## 2024-02-03 LAB — RAD ONC ARIA SESSION SUMMARY
Course Elapsed Days: 45
Plan Fractions Treated to Date: 6
Plan Prescribed Dose Per Fraction: 1.8 Gy
Plan Total Fractions Prescribed: 13
Plan Total Prescribed Dose: 23.4 Gy
Reference Point Dosage Given to Date: 10.8 Gy
Reference Point Session Dosage Given: 1.8 Gy
Session Number: 31

## 2024-02-07 ENCOUNTER — Ambulatory Visit
Admission: RE | Admit: 2024-02-07 | Discharge: 2024-02-07 | Disposition: A | Discharge status: Home / Self Care | Payer: Self-pay | Source: Ambulatory Visit | Attending: Radiation Oncology | Admitting: Radiation Oncology

## 2024-02-07 ENCOUNTER — Other Ambulatory Visit: Payer: Self-pay

## 2024-02-07 DIAGNOSIS — C61 Malignant neoplasm of prostate: Secondary | ICD-10-CM | POA: Diagnosis not present

## 2024-02-07 LAB — RAD ONC ARIA SESSION SUMMARY
Course Elapsed Days: 49
Plan Fractions Treated to Date: 7
Plan Prescribed Dose Per Fraction: 1.8 Gy
Plan Total Fractions Prescribed: 13
Plan Total Prescribed Dose: 23.4 Gy
Reference Point Dosage Given to Date: 12.6 Gy
Reference Point Session Dosage Given: 1.8 Gy
Session Number: 32

## 2024-02-08 ENCOUNTER — Ambulatory Visit

## 2024-02-08 ENCOUNTER — Ambulatory Visit: Payer: Self-pay

## 2024-02-09 ENCOUNTER — Ambulatory Visit
Admission: RE | Admit: 2024-02-09 | Discharge: 2024-02-09 | Disposition: A | Discharge status: Home / Self Care | Payer: Self-pay | Source: Ambulatory Visit | Attending: Radiation Oncology | Admitting: Radiation Oncology

## 2024-02-09 ENCOUNTER — Ambulatory Visit

## 2024-02-09 ENCOUNTER — Other Ambulatory Visit: Payer: Self-pay

## 2024-02-09 DIAGNOSIS — C61 Malignant neoplasm of prostate: Secondary | ICD-10-CM | POA: Diagnosis not present

## 2024-02-09 LAB — RAD ONC ARIA SESSION SUMMARY
Course Elapsed Days: 51
Plan Fractions Treated to Date: 8
Plan Prescribed Dose Per Fraction: 1.8 Gy
Plan Total Fractions Prescribed: 13
Plan Total Prescribed Dose: 23.4 Gy
Reference Point Dosage Given to Date: 14.4 Gy
Reference Point Session Dosage Given: 1.8 Gy
Session Number: 33

## 2024-02-10 ENCOUNTER — Ambulatory Visit
Admission: RE | Admit: 2024-02-10 | Discharge: 2024-02-10 | Disposition: A | Discharge status: Home / Self Care | Payer: Self-pay | Source: Ambulatory Visit | Attending: Radiation Oncology | Admitting: Radiation Oncology

## 2024-02-10 ENCOUNTER — Other Ambulatory Visit: Payer: Self-pay

## 2024-02-10 ENCOUNTER — Ambulatory Visit: Payer: Self-pay

## 2024-02-10 DIAGNOSIS — C61 Malignant neoplasm of prostate: Secondary | ICD-10-CM | POA: Diagnosis not present

## 2024-02-10 LAB — RAD ONC ARIA SESSION SUMMARY
Course Elapsed Days: 52
Plan Fractions Treated to Date: 9
Plan Prescribed Dose Per Fraction: 1.8 Gy
Plan Total Fractions Prescribed: 13
Plan Total Prescribed Dose: 23.4 Gy
Reference Point Dosage Given to Date: 16.2 Gy
Reference Point Session Dosage Given: 1.8 Gy
Session Number: 34

## 2024-02-13 ENCOUNTER — Ambulatory Visit: Payer: Self-pay

## 2024-02-13 ENCOUNTER — Ambulatory Visit

## 2024-02-14 ENCOUNTER — Ambulatory Visit

## 2024-02-14 ENCOUNTER — Ambulatory Visit
Admission: RE | Admit: 2024-02-14 | Discharge: 2024-02-14 | Disposition: A | Discharge status: Home / Self Care | Source: Ambulatory Visit | Attending: Radiation Oncology | Admitting: Radiation Oncology

## 2024-02-14 ENCOUNTER — Other Ambulatory Visit: Payer: Self-pay

## 2024-02-14 DIAGNOSIS — C61 Malignant neoplasm of prostate: Secondary | ICD-10-CM | POA: Insufficient documentation

## 2024-02-14 LAB — RAD ONC ARIA SESSION SUMMARY
Course Elapsed Days: 56
Plan Fractions Treated to Date: 10
Plan Prescribed Dose Per Fraction: 1.8 Gy
Plan Total Fractions Prescribed: 13
Plan Total Prescribed Dose: 23.4 Gy
Reference Point Dosage Given to Date: 18 Gy
Reference Point Session Dosage Given: 1.8 Gy
Session Number: 35

## 2024-02-15 ENCOUNTER — Ambulatory Visit

## 2024-02-16 ENCOUNTER — Other Ambulatory Visit: Payer: Self-pay

## 2024-02-16 ENCOUNTER — Ambulatory Visit
Admission: RE | Admit: 2024-02-16 | Discharge: 2024-02-16 | Disposition: A | Discharge status: Home / Self Care | Source: Ambulatory Visit | Attending: Radiation Oncology | Admitting: Radiation Oncology

## 2024-02-16 ENCOUNTER — Ambulatory Visit

## 2024-02-16 DIAGNOSIS — C61 Malignant neoplasm of prostate: Secondary | ICD-10-CM | POA: Diagnosis not present

## 2024-02-16 LAB — RAD ONC ARIA SESSION SUMMARY
Course Elapsed Days: 58
Plan Fractions Treated to Date: 11
Plan Prescribed Dose Per Fraction: 1.8 Gy
Plan Total Fractions Prescribed: 13
Plan Total Prescribed Dose: 23.4 Gy
Reference Point Dosage Given to Date: 19.8 Gy
Reference Point Session Dosage Given: 1.8 Gy
Session Number: 36

## 2024-02-17 ENCOUNTER — Other Ambulatory Visit: Payer: Self-pay

## 2024-02-17 ENCOUNTER — Ambulatory Visit
Admission: RE | Admit: 2024-02-17 | Discharge: 2024-02-17 | Disposition: A | Discharge status: Home / Self Care | Source: Ambulatory Visit | Attending: Radiation Oncology | Admitting: Radiation Oncology

## 2024-02-17 DIAGNOSIS — C61 Malignant neoplasm of prostate: Secondary | ICD-10-CM | POA: Diagnosis not present

## 2024-02-17 LAB — RAD ONC ARIA SESSION SUMMARY
Course Elapsed Days: 59
Plan Fractions Treated to Date: 12
Plan Prescribed Dose Per Fraction: 1.8 Gy
Plan Total Fractions Prescribed: 13
Plan Total Prescribed Dose: 23.4 Gy
Reference Point Dosage Given to Date: 21.6 Gy
Reference Point Session Dosage Given: 1.8 Gy
Session Number: 37

## 2024-02-18 ENCOUNTER — Ambulatory Visit

## 2024-02-20 ENCOUNTER — Ambulatory Visit
Admission: RE | Admit: 2024-02-20 | Discharge: 2024-02-20 | Disposition: A | Discharge status: Home / Self Care | Source: Ambulatory Visit | Attending: Radiation Oncology | Admitting: Radiation Oncology

## 2024-02-20 ENCOUNTER — Telehealth: Payer: Self-pay

## 2024-02-20 ENCOUNTER — Other Ambulatory Visit: Payer: Self-pay

## 2024-02-20 DIAGNOSIS — C61 Malignant neoplasm of prostate: Secondary | ICD-10-CM | POA: Diagnosis not present

## 2024-02-20 LAB — RAD ONC ARIA SESSION SUMMARY
Course Elapsed Days: 62
Plan Fractions Treated to Date: 13
Plan Prescribed Dose Per Fraction: 1.8 Gy
Plan Total Fractions Prescribed: 13
Plan Total Prescribed Dose: 23.4 Gy
Reference Point Dosage Given to Date: 23.4 Gy
Reference Point Session Dosage Given: 1.8 Gy
Session Number: 38

## 2024-02-20 NOTE — Progress Notes (Signed)
 Patient was a RadOnc Consult on 12/06/23 for his locally advanced prostate cancer and adverse pathology with PSA 0.037 s/p RALP 04/2023.  Patient proceed with treatment recommendations of 7.5 week course of adjuvant external beam therapy concurrent with ST-ADT and has his final radiation treatment on 02/20/24.   Patient is scheduled for a post treatment nurse call on 03/20/24 and has his first post treatment PSA on 05/08/24 at [facility].

## 2024-02-21 NOTE — Radiation Completion Notes (Addendum)
  Radiation Oncology         [phone] ________________________________  Name: Walter Nichols MRN: [id number]  Date: 02/20/2024  DOB: 1971-04-24  Referring Physician: NORETTA FERRARA, M.D. Date of Service: 2024-02-21 Radiation Oncologist: Adina Barge, M.D. [facility]     RADIATION ONCOLOGY END OF TREATMENT NOTE     Diagnosis: 53 y.o. gentleman with locally advanced prostate cancer and adverse pathology with PSA 0.037 s/p RALP 04/2023 for Stage pT3bN1, Gleason 4+5 prostate cancer   Intent: Curative     ==========DELIVERED PLANS==========  First Treatment Date: 2023-12-20 Last Treatment Date: 2024-02-20   Plan Name: ProstBed_Pelv Site: Prostate Bed Technique: IMRT Mode: Photon Dose Per Fraction: 1.8 Gy Prescribed Dose (Delivered / Prescribed): 45 Gy / 45 Gy Prescribed Fxs (Delivered / Prescribed): 25 / 25   Plan Name: ProstBed_Bst Site: Prostate Bed Technique: IMRT Mode: Photon Dose Per Fraction: 1.8 Gy Prescribed Dose (Delivered / Prescribed): 23.4 Gy / 23.4 Gy Prescribed Fxs (Delivered / Prescribed): 13 / 13     ==========ON TREATMENT VISIT DATES========== 2023-12-23, 2023-12-30, 2024-01-05, 2024-01-12, 2024-01-19, 2024-01-27, 2024-02-16, 2024-02-16    See weekly On Treatment Notes in Epic for details in the Media tab (listed as Progress notes on the On Treatment Visit Dates listed above).  The patient tolerated treatments well with only mild increased LUTS and modest fatigue.  The patient will receive a call in about one month from the radiation oncology department. He will continue follow up with his urologist, Dr. FERRARA, as well.  ------------------------------------------------   [name], MD [facility]  Radiation Oncology Direct Dial: [phone]  Fax: [phone] [email or web address]  Skype  LinkedIn

## 2024-03-09 ENCOUNTER — Other Ambulatory Visit: Payer: Self-pay | Admitting: Urology

## 2024-03-09 DIAGNOSIS — C61 Malignant neoplasm of prostate: Secondary | ICD-10-CM

## 2024-03-20 ENCOUNTER — Ambulatory Visit
Admission: RE | Admit: 2024-03-20 | Discharge: 2024-03-20 | Disposition: A | Discharge status: Home / Self Care | Source: Ambulatory Visit | Attending: Radiation Oncology | Admitting: Radiation Oncology

## 2024-03-20 DIAGNOSIS — C61 Malignant neoplasm of prostate: Secondary | ICD-10-CM | POA: Insufficient documentation

## 2024-03-20 NOTE — Progress Notes (Signed)
  Radiation Oncology         [phone] ________________________________  Name: Walter Nichols MRN: [id number]  Date of Service: 03/20/2024  DOB: [birth year]  Post Treatment Telephone Note  Diagnosis:  locally advanced prostate cancer and adverse pathology with PSA 0.037 s/p RALP 04/2023 for Stage pT3bN1, Gleason 4+5 prostate cancer (as documented in provider EOT note)  Pre Treatment IPSS Score: 3 (as documented in the provider consult note)  The patient was not available for call today. Unable to reach patient for today's appointment. 2 calls. No answer/ no voicemail. Call complete.  Patient has a scheduled follow up visit with his urologist, Dr. Renda, on 06/04/2024 for ongoing surveillance. He was counseled that PSA levels will be drawn in the urology office, and was reassured that additional time is expected to improve bowel and bladder symptoms. He was encouraged to call back with concerns or questions regarding radiation.   This concludes the interaction.  Rosaline Minerva, LPN

## 2024-04-02 ENCOUNTER — Telehealth: Payer: Self-pay | Admitting: *Deleted

## 2024-04-02 NOTE — Telephone Encounter (Signed)
 Walter Nichols, [phone] (home) calling about form for work.  I need four days covered.  They are writing me up for,  Placed me in a financial emergency.  Going to have to file bankrupt due to having cancer.  It's been a rough year and a half.  Received 8-weeks of radiation.  Called out of work 02/27/2024, 03/10/2024, 03/15/2024 I left work early and was out 03/30/2024 and again today, 04/02/2024.  Experience cold sweats, diarrhea, was impacted.   Nothing received for this patient.  Sent [facility] CoverSheet and [place] HIPAA Authorization to [name].    left work early, was out of work [email or web address]  Added this work email to demographics.

## 2024-04-04 ENCOUNTER — Ambulatory Visit: Payer: Self-pay

## 2024-04-04 ENCOUNTER — Emergency Department (HOSPITAL_COMMUNITY): Payer: Self-pay

## 2024-04-04 ENCOUNTER — Encounter (HOSPITAL_COMMUNITY): Payer: Self-pay

## 2024-04-04 ENCOUNTER — Emergency Department (HOSPITAL_COMMUNITY)
Admission: EM | Admit: 2024-04-04 | Discharge: 2024-04-04 | Disposition: A | Discharge status: Home / Self Care | Payer: Self-pay | Attending: Emergency Medicine | Admitting: Emergency Medicine

## 2024-04-04 ENCOUNTER — Other Ambulatory Visit: Payer: Self-pay

## 2024-04-04 DIAGNOSIS — I10 Essential (primary) hypertension: Secondary | ICD-10-CM | POA: Insufficient documentation

## 2024-04-04 DIAGNOSIS — Z8546 Personal history of malignant neoplasm of prostate: Secondary | ICD-10-CM | POA: Diagnosis not present

## 2024-04-04 DIAGNOSIS — K625 Hemorrhage of anus and rectum: Secondary | ICD-10-CM | POA: Insufficient documentation

## 2024-04-04 DIAGNOSIS — Z79899 Other long term (current) drug therapy: Secondary | ICD-10-CM | POA: Diagnosis not present

## 2024-04-04 LAB — CBC WITH DIFFERENTIAL/PLATELET
Abs Immature Granulocytes: 0.02 K/uL (ref 0.00–0.07)
Basophils Absolute: 0 K/uL (ref 0.0–0.1)
Basophils Relative: 1 %
Eosinophils Absolute: 0.2 K/uL (ref 0.0–0.5)
Eosinophils Relative: 6 %
HCT: 35.7 % — ABNORMAL LOW (ref 39.0–52.0)
Hemoglobin: 11.6 g/dL — ABNORMAL LOW (ref 13.0–17.0)
Immature Granulocytes: 1 %
Lymphocytes Relative: 19 %
Lymphs Abs: 0.7 K/uL (ref 0.7–4.0)
MCH: 28 pg (ref 26.0–34.0)
MCHC: 32.5 g/dL (ref 30.0–36.0)
MCV: 86.2 fL (ref 80.0–100.0)
Monocytes Absolute: 0.4 K/uL (ref 0.1–1.0)
Monocytes Relative: 9 %
Neutro Abs: 2.4 K/uL (ref 1.7–7.7)
Neutrophils Relative %: 64 %
Platelets: 384 K/uL (ref 150–400)
RBC: 4.14 MIL/uL — ABNORMAL LOW (ref 4.22–5.81)
RDW: 14.3 % (ref 11.5–15.5)
WBC: 3.8 K/uL — ABNORMAL LOW (ref 4.0–10.5)
nRBC: 0 % (ref 0.0–0.2)

## 2024-04-04 LAB — COMPREHENSIVE METABOLIC PANEL WITH GFR
ALT: 17 U/L (ref 0–44)
AST: 14 U/L — ABNORMAL LOW (ref 15–41)
Albumin: 3.9 g/dL (ref 3.5–5.0)
Alkaline Phosphatase: 81 U/L (ref 38–126)
Anion gap: 9 (ref 5–15)
BUN: 9 mg/dL (ref 6–20)
CO2: 19 mmol/L — ABNORMAL LOW (ref 22–32)
Calcium: 9.6 mg/dL (ref 8.9–10.3)
Chloride: 113 mmol/L — ABNORMAL HIGH (ref 98–111)
Creatinine, Ser: 1.28 mg/dL — ABNORMAL HIGH (ref 0.61–1.24)
GFR, Estimated: >60 mL/min (ref >60)
Glucose, Bld: 136 mg/dL — ABNORMAL HIGH (ref 70–99)
Potassium: 3.5 mmol/L (ref 3.5–5.1)
Sodium: 141 mmol/L (ref 135–145)
Total Bilirubin: 0.6 mg/dL (ref 0.0–1.2)
Total Protein: 7.5 g/dL (ref 6.5–8.1)

## 2024-04-04 LAB — TYPE AND SCREEN
ABO/RH(D): A POS
Antibody Screen: NEGATIVE

## 2024-04-04 LAB — PROTIME-INR
INR: 1 (ref 0.8–1.2)
Prothrombin Time: 13.3 s (ref 11.4–15.2)

## 2024-04-04 MED ORDER — IOHEXOL 300 MG/ML  SOLN
100.0000 mL | Freq: Once | INTRAMUSCULAR | Status: AC | PRN
  Administered 2024-04-04: 100 mL via INTRAVENOUS

## 2024-04-04 NOTE — ED Provider Notes (Signed)
 [place] EMERGENCY DEPARTMENT AT [facility] Provider Note   CSN: [id number] Arrival date & time: 04/04/24  1136     Patient presents with: Rectal Bleeding   Walter Nichols is a 53 y.o. male.    Rectal Bleeding Patient with rectal bleeding some pain.  Around 3 weeks ago finished up radiation for prostate cancer. Now has had more blood.  Watery blood.   has had some fatigue and chills.  Had this during the radiation but had been doing better since.    Past Medical History:  Diagnosis Date   Alcoholism (HCC)    Anal fissure    Anxiety    Cancer (HCC)    prostate   Depression    Hypertension    Migraine    Sleep apnea     Prior to Admission medications   Medication Sig Start Date End Date Taking? Authorizing Provider  AMBULATORY NON FORMULARY MEDICATION Nitroglycerin ointment 0.125% apply BID rectally for 6-8 weeks 07/09/20   Cirigliano, Vito V, DO  amLODipine  (NORVASC ) 10 MG tablet TAKE 1 TABLET BY MOUTH EVERY DAY 01/17/24   Saguier, Dallas, PA-C  atorvastatin  (LIPITOR) 10 MG tablet TAKE 1 TABLET BY MOUTH EVERY DAY 01/17/24   Saguier, Dallas, PA-C  buPROPion  (WELLBUTRIN  XL) 150 MG 24 hr tablet Take by mouth. 11/09/23   [provider]  busPIRone  (BUSPAR ) 15 MG tablet Take 1 tablet (15 mg total) by mouth 3 (three) times daily. 12/20/23   Saguier, Dallas, PA-C  cyclobenzaprine  (FLEXERIL ) 5 MG tablet 1 tab po q hs prn tension ha 05/03/22   Antonio Meth, Yvonne R, DO  docusate sodium  (COLACE) 100 MG capsule Take 100 mg by mouth daily.    [provider]  famotidine  (PEPCID ) 20 MG tablet TAKE 1 TABLET BY MOUTH EVERY DAY 01/17/24   Saguier, Dallas, PA-C  fenofibrate  (TRICOR ) 48 MG tablet TAKE 1 TABLET BY MOUTH EVERY DAY 01/17/24   Saguier, Dallas, PA-C  fluticasone  (FLONASE ) 50 MCG/ACT nasal spray Place 2 sprays into both nostrils daily. 06/29/23   Saguier, Dallas, PA-C  levocetirizine (XYZAL ) 5 MG tablet TAKE 1 TABLET BY MOUTH EVERY DAY IN THE EVENING 09/26/23    Saguier, Dallas, PA-C  losartan  (COZAAR ) 25 MG tablet TAKE 1 TABLET (25 MG TOTAL) BY MOUTH DAILY. 10/10/23   Saguier, Dallas, PA-C  megestrol  (MEGACE ) 20 MG tablet Take 1-2 tablets (20-40 mg total) by mouth daily. 01/19/24   Patrcia Cough, MD  nicotine  (NICODERM CQ  - DOSED IN MG/24 HOURS) 21 mg/24hr patch Place 1 patch (21 mg total) onto the skin daily. 02/08/23   Saguier, Dallas, PA-C  sildenafil  (VIAGRA ) 50 MG tablet 1 tab po as needed one hour prior to sex 10/14/20   Saguier, Dallas, PA-C  traZODone  (DESYREL ) 50 MG tablet TAKE 0.5-1 TABLETS BY MOUTH AT BEDTIME AS NEEDED FOR SLEEP. 01/17/24   Saguier, Dallas, PA-C  triamcinolone (NASACORT) 55 MCG/ACT AERO nasal inhaler Place 1 spray into the nose daily as needed (allergies). 12/02/19   [provider]  venlafaxine  XR (EFFEXOR  XR) 150 MG 24 hr capsule Take 1 capsule (150 mg total) by mouth daily with breakfast. 01/20/22   Saguier, Dallas, PA-C    Allergies: Patient has no known allergies.    Review of Systems  Gastrointestinal:  Positive for hematochezia.    Updated Vital Signs BP 139/82   Pulse 65   Temp 97.9 F (36.6 C)   Resp 17   SpO2 98%   Physical Exam Vitals and nursing note  reviewed.  Pulmonary:     Breath sounds: No wheezing.  Abdominal:     Tenderness: There is no abdominal tenderness.  Skin:    Coloration: Skin is not pale.  Neurological:     Mental Status: He is alert.     (all labs ordered are listed, but only abnormal results are displayed) Labs Reviewed  COMPREHENSIVE METABOLIC PANEL WITH GFR - Abnormal; Notable for the following components:      Result Value   Chloride 113 (*)    CO2 19 (*)    Glucose, Bld 136 (*)    Creatinine, Ser 1.28 (*)    AST 14 (*)    All other components within normal limits  CBC WITH DIFFERENTIAL/PLATELET - Abnormal; Notable for the following components:   WBC 3.8 (*)    RBC 4.14 (*)    Hemoglobin 11.6 (*)    HCT 35.7 (*)    All other components within normal limits   PROTIME-INR  TYPE AND SCREEN    EKG: None  Radiology: CT ABDOMEN PELVIS W CONTRAST Result Date: 04/04/2024 CLINICAL DATA:  Rectal pain and bleeding. Recently finished radiation 3 weeks ago. Prostate cancer. * Tracking Code: BO * EXAM: CT ABDOMEN AND PELVIS WITH CONTRAST TECHNIQUE: Multidetector CT imaging of the abdomen and pelvis was performed using the standard protocol following bolus administration of intravenous contrast. RADIATION DOSE REDUCTION: This exam was performed according to the departmental dose-optimization program which includes automated exposure control, adjustment of the mA and/or kV according to patient size and/or use of iterative reconstruction technique. CONTRAST:  OMNIPAQUE  IOHEXOL  300 MG/ML  SOLN COMPARISON:  PET 02/25/2023. FINDINGS: Lower chest: No acute findings. Heart is at the upper limits of normal in size to mildly enlarged. No pericardial or pleural effusion. Distal esophagus is grossly unremarkable. Hepatobiliary: Liver and gallbladder are unremarkable. No biliary ductal dilatation. Pancreas: Negative. Spleen: Negative. Adrenals/Urinary Tract: Low-attenuation nodular thickening of the lateral limb right adrenal gland. No specific follow-up necessary. Left adrenal gland is unremarkable. Low-attenuation lesion off the right kidney. No specific follow-up necessary. Kidneys are otherwise unremarkable. Ureters are decompressed. Bladder is grossly unremarkable. Stomach/Bowel: Stomach, small bowel, appendix and colon are unremarkable. Vascular/Lymphatic: Atherosclerotic calcification of the aorta. No pathologically enlarged lymph nodes. Reproductive: Prostatectomy. Other: 3.5 x 5.3 cm simple appearing fluid collection between the bladder and right acetabulum (2/86). No free fluid. Mesenteries and peritoneum are unremarkable. Musculoskeletal: Small sclerotic lesions in the right iliac wing, unchanged from 02/25/2023 and not hypermetabolic at that time, indicative of bone  islands. No worrisome lytic or sclerotic lesions. Degenerative changes in the spine. IMPRESSION: 1. No findings to explain the patient's rectal pain and bleeding. 2. No evidence of metastatic disease. 3. Probable postoperative seroma adjacent to the right lateral wall the bladder. 4.  Aortic atherosclerosis (ICD10-I70.0). Electronically Signed   By: Newell Eke M.D.   On: 04/04/2024 16:11     Procedures   Medications Ordered in the ED  iohexol  (OMNIPAQUE ) 300 MG/ML solution 100 mL (100 mLs Intravenous Contrast Given 04/04/24 1549)                                    Medical Decision Making Amount and/or Complexity of Data Reviewed Labs: ordered. Radiology: ordered.  Risk Prescription drug management.    is patient patient with GI bleeding.  Rectal pain.  For stage III prostate cancer  Has radiation treatment.  Blood work  overall reassuring.  Mild low white count and mild anemia.  Not significant however.  Will get CT scan to evaluate.  CT scan reassuring.  No clear cause.  Does have a fluid collection but likely not related to this.  Abscess felt less likely.  Also given a work note.    Can follow-up with his doctors.    Final diagnoses:  Rectal bleeding    ED Discharge Orders     None          Patsey Lot, MD 04/04/24 1721

## 2024-04-04 NOTE — Telephone Encounter (Signed)
 FYI Only or Action Required?: Action required by provider: request for appointment.  Patient was last seen in primary care on 10/24/2023 by Dorina Loving, PA-C.  Called Nurse Triage reporting Rectal Bleeding.  Symptoms began several days ago.  Interventions attempted: Nothing.  Symptoms are: unchanged. Pt. Finished radiation treatment for prostate cancer recently. Has dizziness, fatigue. Requests to see PCP only. Will go to ED for evaluation.  Triage Disposition: Go to ED Now (or PCP Triage)  Patient/caregiver understands and will follow disposition?:    Copied from CRM #[id number]. Topic: Clinical - Red Word Triage >> Apr 04, 2024  8:17 AM Franky GRADE wrote: Red Word that prompted transfer to Nurse Triage: Patient is experiencing blood in the stool and feeling fatigued. Patient finished radiation treatment for prostate cancer. Reason for Disposition  Patient sounds very sick or weak to the triager  Answer Assessment - Initial Assessment Questions 1. APPEARANCE of BLOOD: What color is it? Is it passed separately, on the surface of the stool, or mixed in with the stool?      Dark red 2. AMOUNT: How much blood was passed?      unsure 3. FREQUENCY: How many times has blood been passed with the stools?      several 4. ONSET: When was the blood first seen in the stools? (Days or weeks)      2 days 5. DIARRHEA: Is there also some diarrhea? If Yes, ask: How many diarrhea stools in the past 24 hours?      no 6. CONSTIPATION: Do you have constipation? If Yes, ask: How bad is it?     yes 7. RECURRENT SYMPTOMS: Have you had blood in your stools before? If Yes, ask: When was the last time? and What happened that time?      yes 8. BLOOD THINNERS: Do you take any blood thinners? (e.g., aspirin, clopidogrel / Plavix, coumadin, heparin ). Notes: Other strong blood thinners include: Arixtra (fondaparinux), Eliquis (apixaban), Pradaxa (dabigatran), and Xarelto (rivaroxaban).      no 9. OTHER SYMPTOMS: Do you have any other symptoms?  (e.g., abdomen pain, vomiting, dizziness, fever)     Fatigue, dizzy 10. PREGNANCY: Is there any chance you are pregnant? When was your last menstrual period?       no  Protocols used: Rectal Bleeding-A-AH

## 2024-04-04 NOTE — Discharge Instructions (Signed)
 Your workup was reassuring today.  Your hemoglobin is slightly down from before.  The CAT scan however was reassuring.  Follow-up with your doctors.

## 2024-04-04 NOTE — ED Triage Notes (Signed)
 Pt reports with rectal bleeding and pain for 2 days. Pt recently finished radiation 3 weeks ago. Pt has been feeling fatigued with cold sweats and dizziness.

## 2024-04-09 NOTE — Telephone Encounter (Signed)
 Is it ok to send ? Copied from CRM [id number]. Topic: Clinical - Medication Question >> Apr 09, 2024  1:34 PM Thersia C wrote: Reason for RMF:Ejupzwu called in regarding buPROPion  (WELLBUTRIN  XL) 150 MG 24 hr tablet , stated he was getting this medication refilled by another provider  and the pharmacy sent it over to them to be refilled but will not refill it wanted to know if PA Dallas Maxwell will refill it for him

## 2024-04-10 NOTE — Progress Notes (Signed)
 RN returned called, no answer, no option for voicemail.

## 2024-04-10 NOTE — Telephone Encounter (Signed)
 Spoke to pt he said [facility]  He says he has been taking it for some months was not exact but he did schedule an appointment to come see you for a follow up

## 2024-04-11 ENCOUNTER — Telehealth: Payer: Self-pay

## 2024-04-11 ENCOUNTER — Inpatient Hospital Stay: Attending: Medical

## 2024-04-11 NOTE — Progress Notes (Signed)
 [facility] Clinical Social Work Suicide Risk Assessment   Walter Nichols is a 53 y.o. male was referred to Clinical Social Work by [employer]  for suicidal ideation. Patient expressed SI while on the phone with staff member. CSW contacted patient by phone.   Current Mental Status: No plan to harm self or others - Patient described passive SI that started approximately 2 weeks ago.  Patient attributed the SI to challenges with extensions of FMLA, change in financial status, and physical effects of cancer.  Loss Factors: Financial problems/change in socioeconomic status Demographic factors:  Male Historical Factors: No historical factors identified.   Patient reported general history with medication management of depression disorder. Patient referred to [facility] in March of 2025 for medication management. Patient prescribed Wellbutrin  by NP at the practice. Patient started rx in March 2025 and stated he continued it until May. Patient cancelled on follow up appointment in April of 2025 due to financial reasons / general frustrations about FMLA process. Patient was loss to follow up and attempted to refill the prescription this month. However, per patient reports he has not been on the medication since early June. Patient stated psychotherapist stated SI is due to cold [place] discontinuance of medication.   Presently, patient is not an active patient of [facility]. Obtaining medication refills from this practice would require the patient to reestablish care and continue follow up appointments. Patient does not want to reestablish care with the agency.   Risk Reduction Factors: Sense of responsibility to family, Employed, Positive social support, and Positive therapeutic relationship CLINICAL FACTORS:  Depression:   Hopelessness   SUICIDE RISK:  Minimal: No identifiable suicidal ideation.  Patients presenting with no risk factors but with morbid ruminations; may be classified as  minimal risk based on the severity of the depressive symptoms.   PLAN OF CARE:  Crisis resources given. Pt agreed to call resources as needed Patient is connected to a Psychotherapist.  Patient will be meeting with his Primary Care Provider to discuss referral to a Psychiatrist.    Lizbeth Sprague, LCSW      Suicide Resources  Who to Call Call 911 National Suicide Prevention Hotline - Dial 988 Crisis Text Line:  text SAVE to 0011001100 [facility]:  [phone] or [phone] [facility]: [phone])[phone] or walk-in to [address] [place], [place] [address] [facility]: (405) 408-6007   More Resources Suicide Awareness Voices of Education       310-203-7863        www.save.org [employer] on Mental Illness(NAMI)       (800) 950-NAMI (6264)        www.nami.org American Association of Suicidology       502-177-2270        www.suicidology.org

## 2024-04-11 NOTE — Telephone Encounter (Signed)
 Notified the pt regarding his FMLA form , trying to clarification. Pt started to explain his situation and . What dates he needed to accommodated. He also had express to  me that he was having suicidal thoughts due to him  being with out his depression medication. H e state that he made his NP  aware of the  situation. He states that she refuse to refill the medication. H e stated that when he talk to his therapist about the thoughts, he said that his therapist let him know that he was having a s aside effect from the medication, when he stopped cold [place]. I reached out to the SW cause he also stress of financial concerns as well as the suicidal thoughts. The SW is familiar with the pt and stated that she will reach out to him. I let the pt know that she will be calling him. He stated that he will wait for her phone call. Before we got off the phone , I ask the pt how was his state of mind at this moment. He stated that he was fine, and that he was just sharing what  he had felt when he was with out the medication. The SW did reach out to him. No questions or concerns to be noted at this time.

## 2024-04-11 NOTE — Telephone Encounter (Signed)
 He said he will contact them to get a fill

## 2024-04-12 ENCOUNTER — Telehealth: Payer: Self-pay | Admitting: Medical

## 2024-04-12 NOTE — Telephone Encounter (Signed)
 Called pt to move up his appt on 8/5. He stated he has been feeling suicidal for the past couple weeks with no avail. Stated oncologist has been made aware and is starting him on Wellbutrin . Advised pt I could have him speak with a triage nurse and offered [facility] number but he declined.

## 2024-04-13 NOTE — Telephone Encounter (Signed)
 Pt called and lvm to return message to discuss concerns and ask if SW has reached out yet.SABRA

## 2024-04-15 ENCOUNTER — Other Ambulatory Visit: Payer: Self-pay | Admitting: Medical

## 2024-04-17 ENCOUNTER — Encounter: Payer: Self-pay | Admitting: Medical

## 2024-04-17 ENCOUNTER — Ambulatory Visit (INDEPENDENT_AMBULATORY_CARE_PROVIDER_SITE_OTHER): Admitting: Medical

## 2024-04-17 VITALS — BP 122/76 | HR 73 | Resp 16 | Ht 74.0 in | Wt 257.6 lb

## 2024-04-17 DIAGNOSIS — R45851 Suicidal ideations: Secondary | ICD-10-CM

## 2024-04-17 DIAGNOSIS — Z8546 Personal history of malignant neoplasm of prostate: Secondary | ICD-10-CM | POA: Diagnosis not present

## 2024-04-17 DIAGNOSIS — F419 Anxiety disorder, unspecified: Secondary | ICD-10-CM

## 2024-04-17 DIAGNOSIS — R5383 Other fatigue: Secondary | ICD-10-CM

## 2024-04-17 DIAGNOSIS — F32A Depression, unspecified: Secondary | ICD-10-CM

## 2024-04-17 NOTE — Progress Notes (Signed)
 Subjective:    Patient ID: Walter Nichols, male    DOB: 05-Dec-1970, 53 y.o.   MRN: [id number]  HPI  Walter Nichols is a 53 year old male with prostate cancer who presents with worsening depression and bleeding post-radiation therapy.  He has been experiencing significant fatigue and depressive symptoms, including feelings of having a 'mental breakdown' and 'nervous breakdown' over the past two and a half to three weeks. The onset of these symptoms is attributed to a stressful interaction with his sister and ongoing legal issues. His mood is extremely low, with persistent sadness and crying spells. He sleeps all day and cries all night. He feels 'better off dead' nearly every day for the past two and a half weeks, though he denies any active suicidal intent, citing responsibilities to his children.  He completed radiation therapy for prostate cancer on July 7th or 8th, which he describes as 'kicking my butt.' He experienced significant fatigue, cold sweats, stomach cramps, and bleeding during and after the treatment. The bleeding occurs in his feces during bowel movements, approximately once a week, and was more frequent during radiation therapy. He reports ongoing symptoms of fatigue and bleeding post-radiation.  He was prescribed Wellbutrin  for depression, initially receiving a 60-count prescription in March, with instructions to take one pill daily for the first five days, then two pills daily. He felt better while on the medication but did not receive a refill after May. He has attempted to contact his pharmacy and the prescribing nurse practitioner for a refill without success.  His daily life is significantly impacted, including reduced work hours and productivity, and concerns about job security due to his medical condition. He has been in contact with a therapist and an Therapist, occupational, who have expressed concern about his mental health.        Review of Systems   Constitutional:  Negative for chills, fatigue and fever.  Respiratory:  Negative for chest tightness, shortness of breath and wheezing.   Cardiovascular:  Negative for chest pain and palpitations.  Gastrointestinal:  Negative for abdominal pain, blood in stool, nausea and vomiting.       Blood in stool post radiatoin. Gradually improved. Pt went to ED. He did not explain how he depressed he was.   Musculoskeletal:  Negative for back pain.  Skin:  Negative for rash.  Hematological:  Negative for adenopathy.  Psychiatric/Behavioral:  Positive for decreased concentration and suicidal ideas. The patient is nervous/anxious.        Thoughts daily but no active plan. Plan agreed to go to [facility] ED for further evaulation.    Past Medical History:  Diagnosis Date   Alcoholism (HCC)    Anal fissure    Anxiety    Cancer (HCC)    prostate   Depression    Hypertension    Migraine    Sleep apnea      Social History   Socioeconomic History   Marital status: Single    Spouse name: Not on file   Number of children: Not on file   Years of education: Not on file   Highest education level: Not on file  Occupational History   Not on file  Tobacco Use   Smoking status: Every Day    Current packs/day: 0.50    Types: Cigarettes   Smokeless tobacco: Never  Vaping Use   Vaping status: Never Used  Substance and Sexual Activity   Alcohol use: Not Currently    Comment:  40 oz a day/ocassionally    Drug use: Not Currently    Comment: in the past used coccaine. 3 yrs ago.   Sexual activity: Never  Other Topics Concern   Not on file  Social History Narrative   Not on file   Social Drivers of Health   Financial Resource Strain: Not on file  Food Insecurity: Food Insecurity Present (12/06/2023)   Hunger Vital Sign    Worried About Running Out of Food in the Last Year: Often true    Ran Out of Food in the Last Year: Often true  Transportation Needs: Unmet Transportation Needs  (12/06/2023)   PRAPARE - Administrator, Civil Service (Medical): Yes    Lack of Transportation (Non-Medical): Yes  Physical Activity: Not on file  Stress: Not on file  Social Connections: Not on file  Intimate Partner Violence: Not At Risk (12/06/2023)   Humiliation, Afraid, Rape, and Kick questionnaire    Fear of Current or Ex-Partner: No    Emotionally Abused: No    Physically Abused: No    Sexually Abused: No    Past Surgical History:  Procedure Laterality Date   HEMORROIDECTOMY     x2   LYMPHADENECTOMY Bilateral 04/28/2023   Procedure: BILATERAL PELVIC LYMPHADENECTOMY;  Surgeon: Renda Glance, MD;  Location: [facility];  Service: Urology;  Laterality: Bilateral;   ROBOT ASSISTED LAPAROSCOPIC RADICAL PROSTATECTOMY N/A 04/28/2023   Procedure: XI ROBOTIC ASSISTED LAPAROSCOPIC RADICAL PROSTATECTOMY LEVEL 2;  Surgeon: Renda Glance, MD;  Location: [facility];  Service: Urology;  Laterality: N/A;  210 MINUTES NEEDED FOR CASE    Family History  Problem Relation Age of Onset   Prostate cancer Maternal Uncle 59   Lung cancer Maternal Grandmother 72   Colon cancer Neg Hx    Esophageal cancer Neg Hx    Rectal cancer Neg Hx    Stomach cancer Neg Hx     No Known Allergies  Current Outpatient Medications on File Prior to Visit  Medication Sig Dispense Refill   AMBULATORY NON FORMULARY MEDICATION Nitroglycerin ointment 0.125% apply BID rectally for 6-8 weeks 30 g 0   amLODipine  (NORVASC ) 10 MG tablet TAKE 1 TABLET BY MOUTH EVERY DAY 90 tablet 0   atorvastatin  (LIPITOR) 10 MG tablet TAKE 1 TABLET BY MOUTH EVERY DAY 90 tablet 1   buPROPion  (WELLBUTRIN  XL) 150 MG 24 hr tablet Take by mouth.     busPIRone  (BUSPAR ) 15 MG tablet Take 1 tablet (15 mg total) by mouth 3 (three) times daily. 270 tablet 1   cyclobenzaprine  (FLEXERIL ) 5 MG tablet 1 tab po q hs prn tension ha 7 tablet 0   docusate sodium  (COLACE) 100 MG capsule Take 100 mg by mouth daily.     famotidine  (PEPCID ) 20 MG tablet  TAKE 1 TABLET BY MOUTH EVERY DAY 90 tablet 1   fenofibrate  (TRICOR ) 48 MG tablet TAKE 1 TABLET BY MOUTH EVERY DAY 90 tablet 0   fluticasone  (FLONASE ) 50 MCG/ACT nasal spray Place 2 sprays into both nostrils daily. 16 g 1   levocetirizine (XYZAL ) 5 MG tablet TAKE 1 TABLET BY MOUTH EVERY DAY IN THE EVENING 90 tablet 1   losartan  (COZAAR ) 25 MG tablet TAKE 1 TABLET (25 MG TOTAL) BY MOUTH DAILY. 90 tablet 2   megestrol  (MEGACE ) 20 MG tablet Take 1-2 tablets (20-40 mg total) by mouth daily. 45 tablet 2   nicotine  (NICODERM CQ  - DOSED IN MG/24 HOURS) 21 mg/24hr patch Place 1 patch (21 mg total)  onto the skin daily. 28 patch 0   sildenafil  (VIAGRA ) 50 MG tablet 1 tab po as needed one hour prior to sex 10 tablet 0   traZODone  (DESYREL ) 50 MG tablet TAKE 0.5-1 TABLETS BY MOUTH AT BEDTIME AS NEEDED FOR SLEEP. 90 tablet 1   triamcinolone (NASACORT) 55 MCG/ACT AERO nasal inhaler Place 1 spray into the nose daily as needed (allergies).     venlafaxine  XR (EFFEXOR  XR) 150 MG 24 hr capsule Take 1 capsule (150 mg total) by mouth daily with breakfast. 30 capsule 11   No current facility-administered medications on file prior to visit.    BP 122/76   Pulse 73   Resp 16   Ht 6' 2 (1.88 m)   Wt 257 lb 9.6 oz (116.8 kg)   SpO2 98%   BMI 33.07 kg/m        Objective:   Physical Exam  General Mental Status- Alert. General Appearance- Not in acute distress.   Skin General: Color- Normal Color. Moisture- Normal Moisture.  Neck Carotid Arteries- Normal color. Moisture- Normal Moisture. No carotid bruits. No JVD.  Chest and Lung Exam Auscultation: Breath Sounds:-CTA  Cardiovascular Auscultation:Rythm- RRR Murmurs & Other Heart Sounds:Auscultation of the heart reveals- No Murmurs.  Abdomen Inspection:-Inspeection Normal. Palpation/Percussion:Note:No mass. Palpation and Percussion of the abdomen reveal- Non Tender, Non Distended + BS, no rebound or guarding.   Neurologic Cranial Nerve exam:-  CN III-XII intact(No nystagmus), symmetric smile. Strength:- 5/5 equal and symmetric strength both upper and lower extremities.       Assessment & Plan:   Patient Instructions   Major depressive disorder, single episode, severe with suicidal ideation Severe depression with suicidal ideation, PHQ-9 score 23. Previous Wellbutrin  treatment inconsistent. Critical state necessitates urgent psychiatric evaluation. - Advised to go to [facility] for urgent psychiatric evaluation.(adress and information given(agrees to go now immediately) Give me update early this evening by my chart. - Provide contact information for [facility] - Advise immediate evaluation due to high PHQ-9 score 23  and suicidal ideation. Gad-7 score high as well. - Discuss potential for psychiatric evaluation to determine appropriate medication management.  Prostate cancer, status post radiation therapy Prostate cancer treated with radiation therapy, completed early July. Post-treatment symptoms include fatigue and rectal bleeding. No recent updates from oncologist or urologist.  Rectal bleeding post-radiation therapy Intermittent rectal bleeding post-radiation, occurring weekly. Known side effect of radiation therapy. - Order CBC to assess blood volume and hemoglobin levels.  Fatigue post-radiation therapy Persistent fatigue with cold sweats and stomach cramps post-radiation for prostate cancer, likely related to treatment. -improving/less symptoms but still every other day mild blood in stool  Follow up next Thursday or sooner with our office if needed     [facility] Emergency Department  [address]  [place], [place] [address]  [phone]  Darryle law -ED evaluation  [facility] [phone]     Dallas Maxwell, PA-C    Time spent with patient today was 44 minutes which consisted of chart review, discussing diagnosis, work up treatment  and documentation.

## 2024-04-17 NOTE — Patient Instructions (Addendum)
  Major depressive disorder, single episode, severe with suicidal ideation Severe depression with suicidal ideation, PHQ-9 score 23. Previous Wellbutrin  treatment inconsistent. Critical state necessitates urgent psychiatric evaluation. - Advised to go to [facility] for urgent psychiatric evaluation.(adress and information given(agrees to go now immediately) Give me update early this evening by my chart. - Provide contact information for [facility] - Advise immediate evaluation due to high PHQ-9 score 23  and suicidal ideation. Gad-7 score high as well. - Discuss potential for psychiatric evaluation to determine appropriate medication management.  Prostate cancer, status post radiation therapy Prostate cancer treated with radiation therapy, completed early July. Post-treatment symptoms include fatigue and rectal bleeding. No recent updates from oncologist or urologist.  Rectal bleeding post-radiation therapy Intermittent rectal bleeding post-radiation, occurring weekly. Known side effect of radiation therapy. - Order CBC to assess blood volume and hemoglobin levels.  Fatigue post-radiation therapy Persistent fatigue with cold sweats and stomach cramps post-radiation for prostate cancer, likely related to treatment. -improving/less symptoms but still every other day mild blood in stool  Follow up next Thursday or sooner with our office if needed     [facility] Emergency Department  [address]  [place], [place] [address]  [phone]  Darryle law -ED evaluation  [facility]. [phone]

## 2024-04-18 ENCOUNTER — Telehealth: Payer: Self-pay | Admitting: Radiation Oncology

## 2024-04-18 NOTE — Telephone Encounter (Signed)
 8/6 Forward outgoing referral to Natro D., so they are aware.

## 2024-04-19 ENCOUNTER — Telehealth: Payer: Self-pay

## 2024-04-19 NOTE — Telephone Encounter (Signed)
 Called patient and went to a call can not be completed. Was calling to discuss urology referral.

## 2024-04-26 ENCOUNTER — Ambulatory Visit: Admitting: Medical

## 2024-05-04 ENCOUNTER — Telehealth: Payer: Self-pay

## 2024-05-04 NOTE — Telephone Encounter (Signed)
 Notified the patient of his completed form, but I got no answer and was unable to LVM. Will follow up on Monday.

## 2024-05-22 ENCOUNTER — Ambulatory Visit: Admitting: Medical

## 2024-05-25 ENCOUNTER — Ambulatory Visit: Admitting: Medical

## 2024-07-06 ENCOUNTER — Encounter: Payer: Self-pay | Admitting: *Deleted

## 2024-07-06 NOTE — Progress Notes (Signed)
 Called pt to schedule SCP appt. Pt begin to tell this RN Navigator how he has been struggling with not only the prostate cancer but with life in general. He did say he is now on anti-depressants and hopeful he can get out of this funk. I happened to tell him about our chaplain and he is interested in talking to her. I will contact Olam today to make her aware of the situation.

## 2024-07-12 ENCOUNTER — Other Ambulatory Visit: Payer: Self-pay | Admitting: Medical

## 2024-07-16 ENCOUNTER — Inpatient Hospital Stay: Attending: Adult Health | Admitting: *Deleted

## 2024-07-16 ENCOUNTER — Encounter: Payer: Self-pay | Admitting: Radiology

## 2024-07-16 ENCOUNTER — Encounter: Payer: Self-pay | Admitting: *Deleted

## 2024-07-16 DIAGNOSIS — C61 Malignant neoplasm of prostate: Secondary | ICD-10-CM

## 2024-07-16 NOTE — Progress Notes (Signed)
SCP reviewed and completed. 

## 2024-07-18 ENCOUNTER — Ambulatory Visit: Admitting: Medical

## 2024-07-19 ENCOUNTER — Encounter: Payer: Self-pay | Admitting: General Practice

## 2024-07-19 NOTE — Progress Notes (Signed)
 [facility] Spiritual Care Note  Attempted call per patient's request via Natro Dove/RN, but no answer/no voicemail. Will continue trying to reach patient.  [address] Olam Corrigan, [place], [facility] Pager [phone] Voicemail [phone]

## 2024-07-27 ENCOUNTER — Encounter: Payer: Self-pay | Admitting: General Practice

## 2024-07-27 NOTE — Progress Notes (Signed)
 [facility] Spiritual Care Note  Called Mr Boylen for phone appointment, but no answer/no voicemail. He has direct Spiritual Care number. Will reach out again for follow-up.  [address] Olam Corrigan, [place], [facility] Pager [phone] Voicemail [phone]

## 2024-07-31 ENCOUNTER — Encounter: Payer: Self-pay | Admitting: General Practice

## 2024-07-31 NOTE — Progress Notes (Signed)
 SPIRITUAL CARE AND COUNSELING CONSULT NOTE   VISIT SUMMARY Mr Conigliaro missed last phone appointment, so phoned him about rescheduling. He reports that he had a bad stomach flu, which he attributes to susceptibility due to significant multifactorial stress. He also reports taking action to meet several recent goals and identifies a current working goal: to get back to regular exercise and strength training in order to replace some of his lost muscle mass and to build self-esteem.  SPIRITUAL ENCOUNTER                                                                                                                                                                      Type of Visit: Follow up Care provided to:: Patient Referral source: Chaplain assessment Reason for visit: Routine spiritual support   SPIRITUAL FRAMEWORK  Presenting Themes: Impactful experiences and emotions, Community and relationships, Goals in life/care Community/Connection: Limited Needs/Challenges/Barriers: Patient reports low energy and cold sweats as barriers to his exercise goals Patient Stress Factors: Health changes, Loss of control, Family relationships, Financial concerns   GOALS   Self/Personal Goals: Regain some muscle mass with exercise/strength training Clinical Care Goals: Provide spiritual and emotional support related to self-esteem and goal achievement   INTERVENTIONS   Spiritual Care Interventions Made: Compassionate presence, Reflective listening, Narrative/life review, Normalization of emotions    INTERVENTION OUTCOMES   Outcomes: Autonomy/agency, Reduced isolation, Awareness of support  SPIRITUAL CARE PLAN   Spiritual Care Issues Still Outstanding: Chaplain will continue to follow Follow up plan : Phone appointment rescheduled for Friday 08/03/2024 at [facility], [facility] Pager [phone] Voicemail [phone]

## 2024-08-03 ENCOUNTER — Encounter: Payer: Self-pay | Admitting: General Practice

## 2024-08-03 NOTE — Progress Notes (Signed)
 [facility] Spiritual Care Note  Reached Walter Nichols for phone appointment; he plans to phone back to reschedule at a better time.  [address] Olam Corrigan, [place], [facility] Pager [phone] Voicemail [phone]

## 2024-09-12 ENCOUNTER — Encounter: Payer: Self-pay | Admitting: General Practice

## 2024-09-12 NOTE — Progress Notes (Signed)
 [facility] Spiritual Care Note  Received email from patient; attempted to respond by phone, but the call was unable to be completed. Responded to email, encouraging return call and updated phone number.  [address] Walter Nichols, [place], [facility] Pager [phone] Voicemail [phone]

## 2024-09-14 ENCOUNTER — Encounter: Payer: Self-pay | Admitting: General Practice

## 2024-09-14 NOTE — Progress Notes (Signed)
 SPIRITUAL CARE AND COUNSELING CONSULT NOTE   VISIT SUMMARY    Returned Mr [employer]. He reports past traumas and several recent losses (neighbor, colleague, significant relationship), which complicate the resilience with which he is approaching his cancer diagnosis and post-treatment healing process. Encouraged regular follow-up with his current counselor for emotional support and with his psychiatrist, particularly regarding his mood and reported energy/engagement level.  SPIRITUAL ENCOUNTER                                                                                                                                                                      Type of Visit: Follow up Care provided to:: Patient Referral source: Patient request Reason for visit: Routine spiritual support  SPIRITUAL FRAMEWORK  Presenting Themes: Goals in life/care, Meaning/purpose/sources of inspiration, Significant life change, Impactful experiences and emotions, Community and relationships Community/Connection: Limited Strengths: Ability to name needs Needs/Challenges/Barriers: Limited energy, low mood, recent losses (deaths of friends, loss of significant relationship) Patient Stress Factors: Lack of caregivers, Family relationships, Loss of control, Major life changes   GOALS   Self/Personal Goals: Seek [place] support Clinical Care Goals: Encourage regular follow-up with counselor and psychiatrist re emotional support and mood/energy symptoms   INTERVENTIONS   Spiritual Care Interventions Made: Compassionate presence, Reflective listening, Normalization of emotions, Bereavement/grief support, Encouragement   INTERVENTION OUTCOMES   Outcomes: Awareness of support, Reduced isolation  SPIRITUAL CARE PLAN   Spiritual Care Issues Still Outstanding: Chaplain will continue to follow Recommendations for Clinical Staff: Pt reports still staying in bed much of the time; assistance to parse out mood vs  treatment factors could help with multidisciplinary treatment plan Follow up plan : Spiritual Care follow-up call in one month    Chaplain Olam Filiberto Lemming, [facility] Pager [phone] Voicemail [phone]

## 2024-09-19 ENCOUNTER — Encounter: Payer: Self-pay | Admitting: General Practice

## 2024-09-19 NOTE — Progress Notes (Signed)
 [facility] Spiritual Care Note  Returned Mr Hawe's call re question about work accommodations; let him know per [facility] team that [facility] [employer] office is the place to direct his questions. Mr Novelo plans to follow up with Spiritual Care as needed.  [address] Olam Corrigan, [place], [facility] Pager [phone] Voicemail [phone]

## 2024-09-24 ENCOUNTER — Telehealth: Payer: Self-pay

## 2024-09-24 ENCOUNTER — Ambulatory Visit: Payer: Self-pay | Admitting: Medical

## 2024-09-24 ENCOUNTER — Other Ambulatory Visit (HOSPITAL_COMMUNITY): Admission: RE | Admit: 2024-09-24 | Discharge: 2024-09-24 | Disposition: A | Source: Ambulatory Visit

## 2024-09-24 ENCOUNTER — Ambulatory Visit: Admitting: Medical

## 2024-09-24 VITALS — BP 122/82 | HR 74 | Temp 98.3°F | Resp 15 | Ht 74.0 in | Wt 265.8 lb

## 2024-09-24 DIAGNOSIS — I10 Essential (primary) hypertension: Secondary | ICD-10-CM

## 2024-09-24 DIAGNOSIS — F32A Depression, unspecified: Secondary | ICD-10-CM

## 2024-09-24 DIAGNOSIS — Z13220 Encounter for screening for lipoid disorders: Secondary | ICD-10-CM | POA: Diagnosis not present

## 2024-09-24 DIAGNOSIS — F419 Anxiety disorder, unspecified: Secondary | ICD-10-CM

## 2024-09-24 DIAGNOSIS — Z23 Encounter for immunization: Secondary | ICD-10-CM

## 2024-09-24 DIAGNOSIS — Z113 Encounter for screening for infections with a predominantly sexual mode of transmission: Secondary | ICD-10-CM | POA: Insufficient documentation

## 2024-09-24 DIAGNOSIS — Z1159 Encounter for screening for other viral diseases: Secondary | ICD-10-CM

## 2024-09-24 DIAGNOSIS — Z8546 Personal history of malignant neoplasm of prostate: Secondary | ICD-10-CM

## 2024-09-24 DIAGNOSIS — K921 Melena: Secondary | ICD-10-CM | POA: Diagnosis not present

## 2024-09-24 DIAGNOSIS — Z0001 Encounter for general adult medical examination with abnormal findings: Secondary | ICD-10-CM

## 2024-09-24 DIAGNOSIS — Z0184 Encounter for antibody response examination: Secondary | ICD-10-CM

## 2024-09-24 DIAGNOSIS — Z0000 Encounter for general adult medical examination without abnormal findings: Secondary | ICD-10-CM

## 2024-09-24 LAB — CBC WITH DIFFERENTIAL/PLATELET
Basophils Absolute: 0 K/uL (ref 0.0–0.1)
Basophils Relative: 0.8 % (ref 0.0–3.0)
Eosinophils Absolute: 0.2 K/uL (ref 0.0–0.7)
Eosinophils Relative: 3.9 % (ref 0.0–5.0)
HCT: 38 % — ABNORMAL LOW (ref 39.0–52.0)
Hemoglobin: 12.6 g/dL — ABNORMAL LOW (ref 13.0–17.0)
Lymphocytes Relative: 15.5 % (ref 12.0–46.0)
Lymphs Abs: 0.8 K/uL (ref 0.7–4.0)
MCHC: 33.3 g/dL (ref 30.0–36.0)
MCV: 84 fl (ref 78.0–100.0)
Monocytes Absolute: 0.5 K/uL (ref 0.1–1.0)
Monocytes Relative: 10.1 % (ref 3.0–12.0)
Neutro Abs: 3.4 K/uL (ref 1.4–7.7)
Neutrophils Relative %: 69.7 % (ref 43.0–77.0)
Platelets: 402 K/uL — ABNORMAL HIGH (ref 150.0–400.0)
RBC: 4.52 Mil/uL (ref 4.22–5.81)
RDW: 15.6 % — ABNORMAL HIGH (ref 11.5–15.5)
WBC: 4.9 K/uL (ref 4.0–10.5)

## 2024-09-24 LAB — PSA: PSA: 0 ng/mL — ABNORMAL LOW (ref 0.10–4.00)

## 2024-09-24 LAB — LIPID PANEL
Cholesterol: 150 mg/dL (ref 28–200)
HDL: 40.3 mg/dL
LDL Cholesterol: 91 mg/dL (ref 10–99)
NonHDL: 109.63
Total CHOL/HDL Ratio: 4
Triglycerides: 91 mg/dL (ref 10.0–149.0)
VLDL: 18.2 mg/dL (ref 0.0–40.0)

## 2024-09-24 LAB — COMPREHENSIVE METABOLIC PANEL WITH GFR
ALT: 13 U/L (ref 3–53)
AST: 15 U/L (ref 5–37)
Albumin: 4.5 g/dL (ref 3.5–5.2)
Alkaline Phosphatase: 107 U/L (ref 39–117)
BUN: 9 mg/dL (ref 6–23)
CO2: 22 meq/L (ref 19–32)
Calcium: 9.6 mg/dL (ref 8.4–10.5)
Chloride: 111 meq/L (ref 96–112)
Creatinine, Ser: 1.16 mg/dL (ref 0.40–1.50)
GFR: 72.58 mL/min
Glucose, Bld: 109 mg/dL — ABNORMAL HIGH (ref 70–99)
Potassium: 4 meq/L (ref 3.5–5.1)
Sodium: 141 meq/L (ref 135–145)
Total Bilirubin: 0.3 mg/dL (ref 0.2–1.2)
Total Protein: 7.4 g/dL (ref 6.0–8.3)

## 2024-09-24 MED ORDER — TRAZODONE HCL 50 MG PO TABS: 25.0000 mg | ORAL_TABLET | Freq: Every evening | ORAL | 3 refills | Status: AC | PRN

## 2024-09-24 NOTE — Telephone Encounter (Signed)
 Copied from CRM #[id number]. Topic: Clinical - Medication Question >> Sep 24, 2024  2:01 PM Alfonso ORN wrote: Reason for CRM: trazadone f/u:pt spoke to psychiatrist and got ok to prescribe trazadone. requesting the sleeping pills be prescribed .   [id number] - [place], [place] - [address]

## 2024-09-24 NOTE — Addendum Note (Signed)
 Addended by: GERARD CHUCKIE SAILOR on: 09/24/2024 11:43 AM   Modules accepted: Orders

## 2024-09-24 NOTE — Addendum Note (Signed)
 Addended by: GERARD CHUCKIE SAILOR on: 09/24/2024 11:47 AM   Modules accepted: Orders

## 2024-09-24 NOTE — Addendum Note (Signed)
 Addended by: DORINA DALLAS DORINA PA-C M on: 09/24/2024 09:36 PM   Modules accepted: Orders

## 2024-09-24 NOTE — Patient Instructions (Addendum)
 For you wellness exam today I have ordered cbc, cmp, hep c antibody, hep surface antibody and  lipid panel.  Vaccine given today flu vaccine and pcv 20 vaccine.  Recommend exercise and healthy diet.  We will let you know lab results as they come in.  Follow up date appointment will be determined after lab review.      Depression and generalized anxiety disorder Experiencing stress due to recent life events. On fluoxetine, Wellbutrin , and Buspar . Symptoms improving but stress persists. - Continue fluoxetine 20 mg daily. - Continue Wellbutrin  300 mg daily. - Continue Buspar  15 mg three times a day. - Psychiatrist and therapist appointment today at 1:30 PM. - Provided referral letter to [facility].  Essential hypertension Blood pressure well controlled on current regimen. - Continue losartan  as prescribed.  Personal history of prostate cancer Regular PSA monitoring. Recent tests negative for cancer. Awaiting March 25th results. Anxiety about recurrence. - Ordered PSA test today. - Continue regular follow-up with urologist.  Rectal bleeding Bright red blood in stool for 1.5 weeks. Differential includes internal hemorrhoids or anal fissures. No black blood noted. - Ordered iron  panel to assess for anemia. - Consider GI referral for anoscopy if indicated by blood work. -Exam negative for external hemorrhoids.  General Health Maintenance Desires lifestyle improvements. Plans to work from home to reduce stress. - Administered flu vaccine. - Administered pneumonia vaccine. - Plan to start Shingrix vaccine series.    Preventive Care 1-22 Years Old, Male Preventive care refers to lifestyle choices and visits with your health care provider that can promote health and wellness. Preventive care visits are also called wellness exams. What can I expect for my preventive care visit? Counseling During your preventive care visit, your health care provider may ask about  your: Medical history, including: Past medical problems. Family medical history. Current health, including: Emotional well-being. Home life and relationship well-being. Sexual activity. Lifestyle, including: Alcohol, nicotine  or tobacco, and drug use. Access to firearms. Diet, exercise, and sleep habits. Safety issues such as seatbelt and bike helmet use. Sunscreen use. Work and work astronomer. Physical exam Your health care provider will check your: Height and weight. These may be used to calculate your BMI (body mass index). BMI is a measurement that tells if you are at a healthy weight. Waist circumference. This measures the distance around your waistline. This measurement also tells if you are at a healthy weight and may help predict your risk of certain diseases, such as type 2 diabetes and high blood pressure. Heart rate and blood pressure. Body temperature. Skin for abnormal spots. What immunizations do I need?  Vaccines are usually given at various ages, according to a schedule. Your health care provider will recommend vaccines for you based on your age, medical history, and lifestyle or other factors, such as travel or where you work. What tests do I need? Screening Your health care provider may recommend screening tests for certain conditions. This may include: Lipid and cholesterol levels. Diabetes screening. This is done by checking your blood sugar (glucose) after you have not eaten for a while (fasting). Hepatitis B test. Hepatitis C test. HIV (human immunodeficiency virus) test. STI (sexually transmitted infection) testing, if you are at risk. Lung cancer screening. Prostate cancer screening. Colorectal cancer screening. Talk with your health care provider about your test results, treatment options, and if necessary, the need for more tests. Follow these instructions at home: Eating and drinking  Eat a diet that includes fresh fruits and  vegetables, whole  grains, lean protein, and low-fat dairy products. Take vitamin and mineral supplements as recommended by your health care provider. Do not drink alcohol if your health care provider tells you not to drink. If you drink alcohol: Limit how much you have to 0-2 drinks a day. Know how much alcohol is in your drink. In the [place] one drink equals one 12 oz bottle of beer (355 mL), one 5 oz glass of wine (148 mL), or one 1 oz glass of hard liquor (44 mL). Lifestyle Brush your teeth every morning and night with fluoride toothpaste. Floss one time each day. Exercise for at least 30 minutes 5 or more days each week. Do not use any products that contain nicotine  or tobacco. These products include cigarettes, chewing tobacco, and vaping devices, such as e-cigarettes. If you need help quitting, ask your health care provider. Do not use drugs. If you are sexually active, practice safe sex. Use a condom or other form of protection to prevent STIs. Take aspirin only as told by your health care provider. Make sure that you understand how much to take and what form to take. Work with your health care provider to find out whether it is safe and beneficial for you to take aspirin daily. Find healthy ways to manage stress, such as: Meditation, yoga, or listening to music. Journaling. Talking to a trusted person. Spending time with friends and family. Minimize exposure to UV radiation to reduce your risk of skin cancer. Safety Always wear your seat belt while driving or riding in a vehicle. Do not drive: If you have been drinking alcohol. Do not ride with someone who has been drinking. When you are tired or distracted. While texting. If you have been using any mind-altering substances or drugs. Wear a helmet and other protective equipment during sports activities. If you have firearms in your house, make sure you follow all gun safety procedures. What's next? Go to your health care provider once a year for  an annual wellness visit. Ask your health care provider how often you should have your eyes and teeth checked. Stay up to date on all vaccines. This information is not intended to replace advice given to you by your health care provider. Make sure you discuss any questions you have with your health care provider. Document Revised: 02/25/2021 Document Reviewed: 02/25/2021 Elsevier Patient Education  2024 [employer].

## 2024-09-24 NOTE — Progress Notes (Signed)
 "  Subjective:    Patient ID: Walter Nichols, male    DOB: 1970-10-12, 54 y.o.   MRN: [id number]  HPI   Pt in for cpe/wellness exam   Working at [employer]. Not exercising much, Diet is moderate healthy.   Will get flu vaccine today. He agrees to flu vaccine. He will delay shingrix vaccine.   He wants std screen labs. No known exposure or std symptoms.     Up to date on colonoscopy.     Walter Nichols is a 54 year old male who presents with rectal bleeding.  He has had daily bright red rectal bleeding for about 1.5 weeks, with blood in the stool and clots. He notes a burning sensation after bowel movements. He denies black stools or rectal itching.  He had prostate cancer and was treated. He reports that his most recent blood work was negative for cancer.  He takes buspirone  15 mg three times daily, fluoxetine 20 mg daily, and bupropion  (Wellbutrin ) 300 mg daily for depression and anxiety. He has been psychiatrically hospitalized in the past and is now under the care of a psychiatrist who adjusted his medications.  He reports significant recent emotional and financial stress, including multiple personal losses and bankruptcy, which he feels have worsened his stress level.  He reports increased urination and difficulty holding his bladder, which he attributes to higher fluid intake. He is working on improving his physical and mental health and his lifestyle for himself and his son.    Review of Systems  Constitutional:  Negative for chills, fatigue and fever.  HENT:  Negative for congestion.   Respiratory:  Negative for chest tightness, shortness of breath and wheezing.   Cardiovascular:  Negative for chest pain and palpitations.  Gastrointestinal:  Negative for abdominal pain, constipation, nausea and vomiting.       Blood in stool  Genitourinary:  Negative for dysuria and frequency.  Musculoskeletal:  Negative for back pain and neck pain.  Skin:  Negative for rash.   Neurological:  Negative for dizziness, syncope, weakness and numbness.  Hematological:  Negative for adenopathy.  Psychiatric/Behavioral:  Positive for dysphoric mood. Negative for behavioral problems and sleep disturbance. The patient is nervous/anxious.        See phq 9 score. Pt is seeing psychiatrist today early afternoon.    Past Medical History:  Diagnosis Date   Alcoholism (HCC)    Anal fissure    Anxiety    Cancer (HCC)    prostate   Depression    Hypertension    Migraine    Sleep apnea      Social History   Socioeconomic History   Marital status: Single    Spouse name: Not on file   Number of children: Not on file   Years of education: Not on file   Highest education level: Not on file  Occupational History   Not on file  Tobacco Use   Smoking status: Every Day    Current packs/day: 0.50    Types: Cigarettes   Smokeless tobacco: Never  Vaping Use   Vaping status: Never Used  Substance and Sexual Activity   Alcohol use: Not Currently    Comment: 40 oz a day/ocassionally    Drug use: Not Currently    Comment: in the past used coccaine. 3 yrs ago.   Sexual activity: Never  Other Topics Concern   Not on file  Social History Narrative   Not on file   Social  Drivers of Health   Tobacco Use: High Risk (07/16/2024)   Patient History    Smoking Tobacco Use: Every Day    Smokeless Tobacco Use: Never    Passive Exposure: Not on file  Financial Resource Strain: Not on file  Food Insecurity: Food Insecurity Present (12/06/2023)   Hunger Vital Sign    Worried About Running Out of Food in the Last Year: Often true    Ran Out of Food in the Last Year: Often true  Transportation Needs: Unmet Transportation Needs (12/06/2023)   PRAPARE - Administrator, Civil Service (Medical): Yes    Lack of Transportation (Non-Medical): Yes  Physical Activity: Not on file  Stress: Not on file  Social Connections: Not on file  Intimate Partner Violence: Not At Risk  (04/28/2024)   Received from [facility]   HITS    Over the last 12 months how often did your partner physically hurt you?: Never    Over the last 12 months how often did your partner insult you or talk down to you?: Never    Over the last 12 months how often did your partner threaten you with physical harm?: Never    Over the last 12 months how often did your partner scream or curse at you?: Never  Depression (PHQ2-9): High Risk (04/17/2024)   Depression (PHQ2-9)    PHQ-2 Score: 23  Alcohol Screen: Low Risk (12/06/2023)   Alcohol Screen    Last Alcohol Screening Score (AUDIT): 0  Housing: High Risk (12/06/2023)   Housing Stability Vital Sign    Unable to Pay for Housing in the Last Year: Yes    Number of Times Moved in the Last Year: 0    Homeless in the Last Year: No  Utilities: At Risk (12/06/2023)   AHC Utilities    Threatened with loss of utilities: Yes  Health Literacy: Not on file    Past Surgical History:  Procedure Laterality Date   HEMORROIDECTOMY     x2   LYMPHADENECTOMY Bilateral 04/28/2023   Procedure: BILATERAL PELVIC LYMPHADENECTOMY;  Surgeon: Renda Glance, MD;  Location: [facility];  Service: Urology;  Laterality: Bilateral;   ROBOT ASSISTED LAPAROSCOPIC RADICAL PROSTATECTOMY N/A 04/28/2023   Procedure: XI ROBOTIC ASSISTED LAPAROSCOPIC RADICAL PROSTATECTOMY LEVEL 2;  Surgeon: Renda Glance, MD;  Location: [facility];  Service: Urology;  Laterality: N/A;  210 MINUTES NEEDED FOR CASE    Family History  Problem Relation Age of Onset   Prostate cancer Maternal Uncle 59   Lung cancer Maternal Grandmother 72   Colon cancer Neg Hx    Esophageal cancer Neg Hx    Rectal cancer Neg Hx    Stomach cancer Neg Hx     Allergies[1]  Medications Ordered Prior to Encounter[2]  BP 122/82   Pulse 74   Temp 98.3 F (36.8 C) (Oral)   Resp 15   Ht 6' 2 (1.88 m)   Wt 265 lb 12.8 oz (120.6 kg)   SpO2 97%   BMI 34.13 kg/m            Objective:   Physical  Exam   General Mental Status- Alert. General Appearance- Not in acute distress.   Skin General: Color- Normal Color. Moisture- Normal Moisture.  Neck No JVD.  Chest and Lung Exam Auscultation: Breath Sounds:-CTA  Cardiovascular Auscultation:Rythm- RRR Murmurs & Other Heart Sounds:Auscultation of the heart reveals- No Murmurs.  Abdomen Inspection:-Inspeection Normal. Palpation/Percussion:Note:No mass. Palpation and Percussion of the abdomen reveal- Non Tender,  Non Distended + BS, no rebound or guarding.  Neurologic Cranial Nerve exam:- CN III-XII intact(No nystagmus), symmetric smile. Strength:- 5/5 equal and symmetric strength both upper and lower extremities.    Rectal exam- on inspection. No Hemmorhoids.      Assessment & Plan:   Patient Instructions  For you wellness exam today I have ordered cbc, cmp, hep c antibody, hep surface antibody and  lipid panel.  Vaccine given today flu vaccine and pcv 20 vaccine.  Recommend exercise and healthy diet.  We will let you know lab results as they come in.  Follow up date appointment will be determined after lab review.      Depression and generalized anxiety disorder Experiencing stress due to recent life events. On fluoxetine, Wellbutrin , and Buspar . Symptoms improving but stress persists. - Continue fluoxetine 20 mg daily. - Continue Wellbutrin  300 mg daily. - Continue Buspar  15 mg three times a day. - Psychiatrist and therapist appointment today at 1:30 PM. - Provided referral letter to [facility].  Essential hypertension Blood pressure well controlled on current regimen. - Continue losartan  as prescribed.  Personal history of prostate cancer Regular PSA monitoring. Recent tests negative for cancer. Awaiting March 25th results. Anxiety about recurrence. - Ordered PSA test today. - Continue regular follow-up with urologist.  Rectal bleeding Bright red blood in stool for 1.5 weeks.  Differential includes internal hemorrhoids or anal fissures. No black blood noted. - Ordered iron  panel to assess for anemia. - Consider GI referral for anoscopy if indicated by blood work. -Exam negative for external hemorrhoids.  General Health Maintenance Desires lifestyle improvements. Plans to work from home to reduce stress. - Administered flu vaccine. - Administered pneumonia vaccine. - Plan to start Shingrix vaccine series.      Dallas Maxwell, PA-C     [id number] charge in addition to wellness exam. Addressed mood, depression, blood in stool and htn.    [1] No Known Allergies [2]  Current Outpatient Medications on File Prior to Visit  Medication Sig Dispense Refill   AMBULATORY NON FORMULARY MEDICATION Nitroglycerin ointment 0.125% apply BID rectally for 6-8 weeks 30 g 0   amLODipine  (NORVASC ) 10 MG tablet TAKE 1 TABLET BY MOUTH EVERY DAY 90 tablet 0   atorvastatin  (LIPITOR) 10 MG tablet TAKE 1 TABLET BY MOUTH EVERY DAY 90 tablet 1   buPROPion  (WELLBUTRIN  XL) 150 MG 24 hr tablet Take by mouth.     buPROPion  (WELLBUTRIN  XL) 300 MG 24 hr tablet Take 300 mg by mouth every morning.     busPIRone  (BUSPAR ) 15 MG tablet TAKE 1 TABLET BY MOUTH 3 TIMES DAILY. 270 tablet 1   cyclobenzaprine  (FLEXERIL ) 5 MG tablet 1 tab po q hs prn tension ha 7 tablet 0   docusate sodium  (COLACE) 100 MG capsule Take 100 mg by mouth daily.     famotidine  (PEPCID ) 20 MG tablet TAKE 1 TABLET BY MOUTH EVERY DAY 90 tablet 1   fenofibrate  (TRICOR ) 48 MG tablet TAKE 1 TABLET BY MOUTH EVERY DAY 90 tablet 0   FLUoxetine (PROZAC) 20 MG capsule Take 20 mg by mouth daily.     fluticasone  (FLONASE ) 50 MCG/ACT nasal spray Place 2 sprays into both nostrils daily. 16 g 1   levocetirizine (XYZAL ) 5 MG tablet TAKE 1 TABLET BY MOUTH EVERY DAY IN THE EVENING 90 tablet 1   losartan  (COZAAR ) 25 MG tablet TAKE 1 TABLET (25 MG TOTAL) BY MOUTH DAILY. 90 tablet 2   megestrol  (MEGACE ) 20 MG tablet  Take 1-2 tablets (20-40 mg  total) by mouth daily. 45 tablet 2   sildenafil  (VIAGRA ) 50 MG tablet 1 tab po as needed one hour prior to sex 10 tablet 0   traZODone  (DESYREL ) 50 MG tablet TAKE 0.5-1 TABLETS BY MOUTH AT BEDTIME AS NEEDED FOR SLEEP. 90 tablet 1   triamcinolone (NASACORT) 55 MCG/ACT AERO nasal inhaler Place 1 spray into the nose daily as needed (allergies).     venlafaxine  XR (EFFEXOR  XR) 150 MG 24 hr capsule Take 1 capsule (150 mg total) by mouth daily with breakfast. 30 capsule 11   nicotine  (NICODERM CQ  - DOSED IN MG/24 HOURS) 21 mg/24hr patch Place 1 patch (21 mg total) onto the skin daily. (Patient not taking: Reported on 09/24/2024) 28 patch 0   No current facility-administered medications on file prior to visit.   "

## 2024-09-25 ENCOUNTER — Other Ambulatory Visit: Payer: Self-pay | Admitting: Medical

## 2024-09-25 LAB — IRON,TIBC AND FERRITIN PANEL
%SAT: 17 % — ABNORMAL LOW (ref 20–48)
Ferritin: 50 ng/mL (ref 38–380)
Iron: 57 ug/dL (ref 50–180)
TIBC: 337 ug/dL (ref 250–425)

## 2024-09-25 LAB — HEPATITIS C ANTIBODY: Hepatitis C Ab: NONREACTIVE

## 2024-09-25 LAB — URINE CYTOLOGY ANCILLARY ONLY
Chlamydia: NEGATIVE
Comment: NEGATIVE
Comment: NEGATIVE
Comment: NORMAL
Neisseria Gonorrhea: NEGATIVE
Trichomonas: NEGATIVE

## 2024-09-25 LAB — HIV ANTIBODY (ROUTINE TESTING W REFLEX)
HIV 1&2 Ab, 4th Generation: NONREACTIVE
HIV FINAL INTERPRETATION: NEGATIVE

## 2024-09-25 LAB — SYPHILIS: RPR W/REFLEX TO RPR TITER AND TREPONEMAL ANTIBODIES, TRADITIONAL SCREENING AND DIAGNOSIS ALGORITHM: RPR Ser Ql: NONREACTIVE

## 2024-09-25 LAB — HEPATITIS B SURFACE ANTIBODY, QUANTITATIVE: Hep B S AB Quant (Post): <5 m[IU]/mL — ABNORMAL LOW

## 2024-09-25 MED ORDER — IRON (FERROUS SULFATE) 325 (65 FE) MG PO TABS: 325.0000 mg | ORAL_TABLET | Freq: Every day | ORAL | 0 refills | Status: AC

## 2024-09-25 NOTE — Addendum Note (Signed)
 Addended by: DORINA DALLAS DORINA PA-C M on: 09/25/2024 12:36 PM   Modules accepted: Orders

## 2024-09-26 NOTE — Progress Notes (Signed)
 Pt called and aware also was able to schedule him for 10/01/2024 for nv hep b vaccine

## 2024-09-28 ENCOUNTER — Telehealth: Payer: Self-pay

## 2024-09-28 NOTE — Telephone Encounter (Signed)
 Called pt due to an error in scheduling him for his hep b vaccine. He says he is only available between 1pm and 2 pm. There is no available appointments during that time so I advised him I will talk to my managers and see if he can still come in at 1:15 on Monday 10/01/2024 and I will administer the vaccine. He also stated that he needs a shingles vaccine that day too. Will ask pcp to give the ok to add this to his NV

## 2024-10-01 ENCOUNTER — Other Ambulatory Visit

## 2024-10-01 ENCOUNTER — Ambulatory Visit
